# Patient Record
Sex: Male | Born: 1945 | Race: White | Hispanic: No | Smoking: Former smoker
Health system: Southern US, Community
[De-identification: ages and names within clinical notes are randomized; demographics above are authoritative.]

## PROBLEM LIST (undated history)

## (undated) DIAGNOSIS — I10 Essential (primary) hypertension: Secondary | ICD-10-CM

## (undated) DIAGNOSIS — D510 Vitamin B12 deficiency anemia due to intrinsic factor deficiency: Secondary | ICD-10-CM

## (undated) DIAGNOSIS — D591 Other autoimmune hemolytic anemias: Secondary | ICD-10-CM

## (undated) DIAGNOSIS — D589 Hereditary hemolytic anemia, unspecified: Secondary | ICD-10-CM

## (undated) DIAGNOSIS — I2510 Atherosclerotic heart disease of native coronary artery without angina pectoris: Secondary | ICD-10-CM

## (undated) DIAGNOSIS — E785 Hyperlipidemia, unspecified: Secondary | ICD-10-CM

## (undated) HISTORY — DX: Vitamin B12 deficiency anemia due to intrinsic factor deficiency: D51.0

## (undated) HISTORY — DX: Atherosclerotic heart disease of native coronary artery without angina pectoris: I25.10

## (undated) HISTORY — DX: Hyperlipidemia, unspecified: E78.5

## (undated) HISTORY — DX: Hereditary hemolytic anemia, unspecified: D58.9

## (undated) HISTORY — DX: Essential (primary) hypertension: I10

## (undated) HISTORY — DX: Other autoimmune hemolytic anemias: D59.1

---

## 2002-02-11 HISTORY — PX: CORONARY ARTERY BYPASS GRAFT: SHX141

## 2002-12-14 ENCOUNTER — Inpatient Hospital Stay (HOSPITAL_COMMUNITY): Admission: AD | Admit: 2002-12-14 | Discharge: 2002-12-20 | Payer: Self-pay | Admitting: Cardiology

## 2004-11-30 ENCOUNTER — Ambulatory Visit: Payer: Self-pay | Admitting: Cardiology

## 2006-02-07 ENCOUNTER — Ambulatory Visit: Payer: Self-pay | Admitting: Cardiology

## 2007-02-27 ENCOUNTER — Ambulatory Visit: Payer: Self-pay | Admitting: Cardiology

## 2007-03-03 ENCOUNTER — Encounter: Payer: Self-pay | Admitting: Cardiology

## 2007-06-19 ENCOUNTER — Ambulatory Visit: Payer: Self-pay | Admitting: Cardiology

## 2007-06-19 ENCOUNTER — Encounter: Payer: Self-pay | Admitting: Cardiology

## 2008-04-11 ENCOUNTER — Ambulatory Visit: Payer: Self-pay | Admitting: Cardiology

## 2008-11-28 DIAGNOSIS — E785 Hyperlipidemia, unspecified: Secondary | ICD-10-CM | POA: Insufficient documentation

## 2008-11-28 DIAGNOSIS — I10 Essential (primary) hypertension: Secondary | ICD-10-CM | POA: Insufficient documentation

## 2009-07-07 ENCOUNTER — Ambulatory Visit: Payer: Self-pay | Admitting: Cardiology

## 2009-07-07 DIAGNOSIS — I2510 Atherosclerotic heart disease of native coronary artery without angina pectoris: Secondary | ICD-10-CM | POA: Insufficient documentation

## 2010-03-13 NOTE — Assessment & Plan Note (Signed)
Summary: 1 YR FU PER MAR REMINDER-SRS   Visit Type:  Follow-up Primary Provider:  Dr. Doreen Beam   History of Present Illness: 65 year old male presents for follow-up. He reports no new problems with angina or unusual breathlessness. 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 He has not been exercising as regularly, typically no more than once a week. We did discuss this some today. He reports compliance with his medications, and is due later this year for a routine physical with Dr. Sherril Croon, at which time he typically has followup labs.  I reviewed his last ischemic evaluation via Cardiolite from May 2009.  Preventive Screening-Counseling & Management  Alcohol-Tobacco     Smoking Status: quit     Year Started: 33 yrs     Year Quit: 7 yrs ago  Current Medications (verified): 1)  Aspir-Low 81 Mg Tbec (Aspirin) .... Take 1 Tablet By Mouth Once A Day 2)  Red Yeast Rice Extract 600 Mg Caps (Red Yeast Rice Extract) .... Take 1 Tablet By Mouth Once A Day 3)  Garlic Oil 1000 Mg Caps (Garlic) .... Take 1 Tablet By Mouth Once A Day 4)  Fish Oil 1000 Mg Caps (Omega-3 Fatty Acids) .... Take 1 Tablet By Mouth Once A Day 5)  Multivitamins  Tabs (Multiple Vitamin) .... Take 1 Tablet By Mouth Once A Day 6)  Flax Seed Oil 1000 Mg Caps (Flaxseed (Linseed)) .... Take 1 Tablet By Mouth Once A Day 7)  Metoprolol Succinate 25 Mg Xr24h-Tab (Metoprolol Succinate) .... Take 1 Tablet By Mouth Once A Day 8)  Saw Palmetto 500 Mg Caps (Saw Palmetto (Serenoa Repens)) .... Take 1 Tablet By Mouth Once A Day 9)  Magnesium Gluconate 500 Mg Tabs (Magnesium Gluconate) .... Take 1 Tablet By Mouth Once A Day  Allergies (verified): No Known Drug Allergies  Comments:  Nurse/Medical Assistant: Did not bring list or meds.  Went from [employer].   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         Chronic intermittent edema of the right lower leg following vein harvesting. Appetite stable. No melena or hematochezia. Occasional nosebleed. Otherwise reviewed and negative.  Vital Signs:  Patient profile:   65 year old male Height:      73 inches Weight:      224.75 pounds BMI:     29.76 Pulse rate:   76 / minute BP sitting:   152 / 84  (right arm) Cuff size:   regular  Vitals Entered By: Hoover Brunette, LPN (Jul 07, 2009 2:57 PM)  Nutrition Counseling: Patient's BMI is greater than 25 and therefore counseled on weight management options. Is Patient Diabetic? No   Physical Exam  Additional Exam:  Overweight male in no acute distress. HEENT: Conjunctiva and lids normal, oropharynx clear. Neck: Supple, no elevated JVP or bruits. No thyromegaly. Lungs: Clear to auscultation, nonlabored. Cardiac: Regular rate and rhythm, no S3. Abdomen: Soft, nontender, bowel sounds present there Extremities: 1+ pitting edema right worse than left, distal pulses  one plus. Skin: Warm and dry.    Nuclear Study  Procedure date:  06/19/2007  Findings:      No diagnostic ST changes.  Large, fixed inferior defect consistent with scar, no frank ischemia.  LVEF 59%.  EKG  Procedure date:  07/07/2009  Findings:      Normal sinus rhythm at 71 beats per minute. Inferior Q waves noted.  Impression & Recommendations:  Problem # 1:  CORONARY ATHEROSCLEROSIS NATIVE CORONARY ARTERY (ICD-414.01)  Symptomatically stable on medical therapy. Last ischemic evaluation was in May 2009. His electrocardiogram  is also stable. We discussed a more consistent exercise regimen, and weight control. He plans to followup with Dr. Sherril Croon for a routine physical later this year. We plan to continue annual visits for now.  His updated medication list for this problem includes:    Aspir-low 81 Mg Tbec (Aspirin) .Marland Kitchen... Take 1 tablet by mouth once a day    Metoprolol Succinate 25 Mg Xr24h-tab (Metoprolol succinate) .Marland Kitchen... Take 1 tablet by mouth once a day  Problem # 2:  HYPERLIPIDEMIA-MIXED (ICD-272.4)  Followed by Dr. Sherril Croon. Patient continues on garlic supplements, red yeast rice extract, as well as omega-3 supplements.  Problem # 3:  HYPERTENSION, UNSPECIFIED (ICD-401.9)  Blood pressure elevated today. [name] tells me that this is not a typical measurement. I asked him to keep an eye on this. Weight loss and exercise would be beneficial. An ACE inhibitor would be a consideration if his pressure remains in this area.  His updated medication list for this problem includes:    Aspir-low 81 Mg Tbec (Aspirin) .Marland Kitchen... Take 1 tablet by mouth once a day    Metoprolol Succinate 25 Mg Xr24h-tab (Metoprolol succinate) .Marland Kitchen... Take 1 tablet by mouth once a day  Other Orders: EKG w/ Interpretation (93000)  Patient Instructions: 1)  Your physician wants you to follow-up in: 1 year. You will receive a reminder letter in the mail one-two months in advance. If you don't receive a letter, please call our office to schedule the follow-up appointment. 2)  Your physician recommends that you continue on your current medications as directed. Please refer to the Current Medication list given to you today.

## 2010-06-26 NOTE — Assessment & Plan Note (Signed)
[facility]                          [facility] NOTE   NAME:Wojtaszek, KEYONTAE HUCKEBY                    MRN:          [id number]  DATE:04/11/2008                            DOB:          11-19-1945    PRIMARY CARE PHYSICIAN:  Doreen Beam, MD   REASON FOR VISIT:  Annual followup.   HISTORY OF PRESENT ILLNESS:  Mr. Sparks was seen back in January 2009.  He has a history of cardiovascular disease, status post coronary artery  bypass grafting in November 2004.  He was referred for a followup  exercise Cardiolite back in May of last year for ischemic surveillance.  This study was reassuring showing an ejection fraction of 59% with mild  hypokinesis in the inferior wall and corresponding scar based on  perfusion imaging without any large areas of ischemia.  Symptomatically,  he has been stable without any significant angina.  He is exercising  perhaps 2 or 3 days a week and has cut back some with the cold weather  recently.  He is due for lipid followup with Dr. Sherril Croon.  His medicines,  otherwise, remain stable.  He is not having any claudication symptoms,  palpitations, or syncope.  His electrocardiogram shows sinus rhythm with  a single premature atrial complex and heart rate is 74 beats per minute.  No substantial changes are noted in comparison of previous tracing.   ALLERGIES:  No known drug allergies.   PRESENT MEDICATIONS:  1. Aspirin 81 mg p.o. daily.  2. Red yeast rice extract.  3. Garlic supplements.  4. Omega-3 fish oil supplements.  5. Multivitamin daily.  6. Flaxseed oil supplements.  7. Toprol-XL 12.5 mg daily.  8. Saw palmetto daily.   REVIEW OF SYMPTOMS:  As outlined above.  Otherwise, negative.   PHYSICAL EXAMINATION:  VITAL SIGNS:  Blood pressure is 142/91, heart  rate is 78, weight is 226 pounds which is stable.  GENERAL:  He is an overweight male in no acute distress.  HEENT:  Conjunctivae are normal. Oropharynx is  clear.  NECK:  Supple.  No elevated jugular venous pressure.  No loud bruits.  No thyromegaly is noted.  LUNGS:  Clear without labored breathing at rest.  CARDIAC:  Regular rate and rhythm.  No S3, gallop, or pericardial rub.  ABDOMEN:  Soft and nontender.  No bruits.  No obvious hepatomegaly.  EXTREMITIES:  No significant pitting edema.  There is trace edema on the  right.  SKIN:  Warm and dry.  MUSCULOSKELETAL:  No kyphosis noted.  NEUROPSYCHIATRIC:  The patient is alert and oriented x3.  Affect is  appropriate.   IMPRESSION AND RECOMMENDATIONS:  1. Cardiovascular disease, status post coronary artery bypass grafting      in November 2004 with overall normal ejection fraction.  Recent      Cardiolite within the last year shows no evidence of ischemia with      some scar in the inferior wall and a stable ejection fraction of      59%.  Mr. Dunnigan is not describing any problems with angina or  significant nitroglycerin use.  I did recommend that he increase      his exercise sessions during the week.  We will plan to continue      with an annual visit.  2. Hypertension, not as well controlled today.  Typically, this has      been in a much better range.  We talked about this some and he will      make more efforts at exercise and diet before considering any      medication adjustments.  I asked him to follow up with Dr. Sherril Croon in      the interim.     Jonelle Sidle, MD  Electronically Signed    SGM/MedQ  DD: 04/11/2008  DT: 04/12/2008  Job #: [id number]   cc:   Doreen Beam, MD

## 2010-06-26 NOTE — Assessment & Plan Note (Signed)
[facility]                          [facility] NOTE   NAME:[name] Page, [name] Page                    MRN:          [id number]  DATE:02/27/2007                            DOB:          1945-05-14    CARDIOLOGIST:  Jonelle Sidle, M.D.   PRIMARY CARE PHYSICIAN:  Doreen Beam, M.D.   REASON FOR VISIT:  Twelve-month follow up.   HISTORY OF THE PRESENT ILLNESS:  This is a 65 year old male patient with  a history of coronary disease status post CABG in November 2004 who  presents for annual follow up.  Since last being seen he has done well.  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  He exercises about three times a week.  He  denies any chest pain or shortness of breath.  Denies any exertional  chest heaviness or tightness.  Denies any orthopnea, PND or pedal edema.  Denies any syncope or near syncope.  Denies any palpitations.   MEDICATIONS:  Current medications include:  1. Toprol XL 25 mg a half tablet daily.  2. Fish oil daily.  3. Flaxseed daily.  4. Aspirin 81 mg daily.  5. Red yeast rice.  6. Magnesium.   ALLERGIES:  No known drug allergies.   PHYSICAL EXAMINATION:  GENERAL APPEARANCE:  On physical exam he is a  well-nourished, well-developed male.  VITAL SIGNS:  Blood pressure 120/79, pulse 88 and weight is 225 pounds.  HEENT:  The head, eyes, ears, nose and throat are normal.  NECK:  The neck is without JVD.  HEART:  Cardiac exam showed normal S1 and S2.  regular Rate and rhythm  without murmurs.  LUNGS:  The lungs are clear to auscultation bilaterally without wheezes,  rhonchi or rales.  ABDOMEN:  The abdomen is soft and nontender with normal bowel sounds.  No organomegaly.  No bruits.  EXTREMITIES:  The extremities are without edema.  Calves are soft and  nontender.  SKIN:  The skin is warm and dry.  NEUROLOGIC EXAMINATION:  Neurologically he is alert and oriented  times  three.  Cranial nerves II-XII are grossly intact.  VASCULAR:  The vascular exam is without carotid bruits bilaterally.   LABORATORY DATA:  The electrocardiogram revealed sinus rhythm with a  heart rate of 87, normal axis, frequent PVCs and no ischemic changes.   IMPRESSION:  1. Coronary disease status post coronary artery bypass graft in      November 2004.  2. Good left ventricular function.  3. Dyslipidemia.  4. Hypertension.  5. Ventricular ectopy   PLAN:  The patient presents today for follow up.  Overall he is doing  well without any recurrent episodes of chest pain or shortness of breath  to suggest ischemia.  He does have some ventricular ectopy noted on his  electrocardiogram.  He tells me that apparently he had a recent  echocardiogram done by Dr. Sherril Croon.  It sounds as though this was done  routinely.  It is greater than four years since his bypass surgery and  he is in need  of stress Cardiolite testing to assess graft patency.  He  would like to wait until after tax season to have this done.  This  should be acceptable.  We will go ahead and get a basic metabolic panel  and a magnesium level today to ensure that his renal function, potassium  and magnesium levels are stable given his increased ventricular ectopy.  He is asymptomatic with this.   I will bring him back in follow up in the next 12 months for routine  follow up.  He is to return sooner if needed.      Tereso Newcomer, PA-C  Electronically Signed      Jonelle Sidle, MD  Electronically Signed   SW/MedQ  DD: 02/27/2007  DT: 02/28/2007  Job #: [id number]   cc:   Doreen Beam, MD

## 2010-06-29 NOTE — Assessment & Plan Note (Signed)
[facility]                          [facility]ICE NOTE   NAME:[name], [name] VERON                    MRN:          [id number]  DATE:02/07/2006                            DOB:          11/18/1945    PRIMARY CARDIOLOGIST:  Jonelle Sidle, M.D.   REASON FOR OFFICE VISIT:  Scheduled annual followup.   [name] [name] is a very pleasant 65 year old male with history of  multivessel coronary artery disease, status post three-vessel CABG in  November 2004, with normal left ventricular function, who now presents  in followup.   Since last seen here in the clinic in October of last year by Dr.  Diona Browner, the patient continues to do extremely well from a clinical  standpoint, with no interim development of any signs/symptoms suggestive  of unstable angina pectoris.  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   Electrocardiogram today reveals NSR at 78 bpm with normal axis and  nonspecific ST abnormalities; persistent small Q-waves in the inferior  leads.   CURRENT MEDICATIONS:  1. Toprol XL 12.5 daily.  2. Fish oil.  3. Niaspan 250 daily.  4. Red yeast rice.  5. Magnesium.  6. Aspirin 81 daily.  7. Flax seed oil.   PHYSICAL EXAMINATION:  Blood pressure 120/80, pulse 78, regular, weight  224.  GENERAL:  A 65 year old male, sitting upright, in no distress.  NECK:  Palpable bilateral carotid pulses without bruits.  LUNGS:  Clear to auscultation in all fields.  HEART:  Regular rate and rhythm (S1 and S2), soft S4.  No significant  murmurs.  EXTREMITIES:  Palpable peripheral pulses with trace right pedal edema.  NEUROLOGIC:  No focal deficits.   IMPRESSION:  1. Coronary artery disease.      a.     Three-vessel CABG, November 2004.      b.     Preserved left ventricular  function.  2. Mixed dyslipidemia/low HDL.  3. Hypertension.  4. Remote tobacco.   PLAN:  Continue current medication regimen.  Continued close monitoring  of lipid status, per Dr. Sherril Croon, with recommended LDL goal of 70 or less.  Of note, the patient does have documented history of low HDL, but is on  both Niaspan and fish oil.  We will plan on having him return to the  clinic in one year for continued followup with Dr. Simona Huh.      Rozell Searing, PA-C  Electronically Signed      Jonelle Sidle, MD  Electronically Signed   GS/MedQ  DD: 02/07/2006  DT: 02/07/2006  Job #: [id number]   cc:   [name] [name]
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
## 2010-06-29 NOTE — Cardiovascular Report (Signed)
NAMEKAINEN, STRUCKMAN                         ACCOUNT NO.:  [id number]   MEDICAL RECORD NO.:  [id number]                   PATIENT TYPE:  OIB   LOCATION:  2023                                 FACILITY:  [facility]   PHYSICIAN:  Arturo Morton. Riley Kill, M.D.             DATE OF BIRTH:  Feb 08, 1946   DATE OF PROCEDURE:  12/14/2002  DATE OF DISCHARGE:                              CARDIAC CATHETERIZATION   INDICATIONS:  The patient is a pleasant 65 year old gentleman who had a  spell of near blindness on his way home from the football game at [place].  This ultimately led to a work-up which included a Cardiolite  which demonstrated redistribution in the inferior wall with a clear cut  inferior defect.  Based upon this he was referred for further evaluation  including cardiac catheterization.  Risks, benefits, and alternatives were  discussed with the patient in detail.   PROCEDURE:  1. Left heart catheterization.  2. Selective coronary arteriography.  3. Selective left ventriculography.  4. Internal mammary angiography.   DESCRIPTION OF PROCEDURE:  The patient was brought to the catheterization  laboratory and prepped and draped in the usual fashion.  Through an anterior  puncture the right femoral artery was easily entered.  6-French sheath was  placed.  Views of the left and right coronary arteries were obtained in  multiple angiographic projections.  To better identify the left system we  used a JL5 guiding catheter.  Ventriculography was performed in the RAO  position.  Subclavian angiography was also performed.  All this was done  without complication.  The patient was taken to the holding area in  satisfactory clinical condition.  The reason for the subclavian angiogram or  internal mammary angiogram included the need to identify the IMA for  arterial conduit for surgery.   HEMODYNAMIC DATA:  1. Central aortic pressure 149/91, mean 116.  2. Left ventricle 127/10.  3. No  gradient on pullback across the aortic valve.   ANGIOGRAPHIC DATA:  1. Ventriculography was performed in the RAO projection.  Overall systolic     function appeared to be reasonably preserved.  There was inferior     hypokinesis.  Ejection fraction would be estimated greater than 55%.     There did not appear to be significant mitral regurgitation.  2. The subclavian with internal mammary appeared to be widely patent.  3. The left main coronary artery is free of critical disease.  4  The left anterior descending proximally is fairly heavily calcified.  Just beyond a fairly normal segment at the ostium the vessel narrows down  and is diffusely diseased with about 70% segmental narrowing which includes  heavy calcification.  Moderate disease extends beyond the origin of the  major diagonal branch with about 50% narrowing just distal to this.  There  is about 30% narrowing more distally.  The distal LAD wraps the apex and is  a significant vessel.  1. The circumflex provides a tiny first marginal or intermediate vessel     which has about 40% proximal narrowing.  Following this there is about     50% narrowing in a tiny marginal branch followed by a 50% eccentric     plaque.  Beyond the takeoff of a second marginal branch is about 60%     narrowing which supplies a very large marginal or posterolateral vessel.     This vessel appears to be suitable for grafting.  2. The right coronary artery demonstrates segmental plaquing of about 40%     near the junction of the proximal mid vessel extending into the mid     vessel where there is a 90% focal eccentric irregular stenosis.  There is     40 and 60% lesions slightly more distally to this lesion encompassing     approximately a 40 mm length area in the mid right coronary.  There is     about 30-40% proximal irregularity in the posterior descending branch as     well.   CONCLUSION:  1. Well preserved left ventricular function.  2. Critical  disease of the right coronary artery with corresponding wall     motion abnormality on ventriculography with concomitant three vessel     coronary artery disease.   RECOMMENDATIONS:  The right coronary artery alone would be treatable, but  would require long stents.  The LAD is not as favorable for percutaneous  intervention and is relatively heavily calcified.  Given the length of the  LAD lesion my leaning is in the direction of revascularization surgery.  I  will ponder these options with the patient and I will have the CVTS surgery  service see him in consultation.                                               Arturo Morton. Riley Kill, M.D.    TDS/MEDQ  D:  12/14/2002  T:  12/15/2002  Job:  [id number]   cc:   Doreen Beam  [address]  [place]  [place] [address]  Fax: [phone]   Jonelle Sidle, M.D. [facility]   CV Lab

## 2010-06-29 NOTE — Op Note (Signed)
[name], [name]                         ACCOUNT NO.:  [id number]   MEDICAL RECORD NO.:  [id number]                   PATIENT TYPE:  INP   LOCATION:  2306                                 FACILITY:  [facility]   PHYSICIAN:  Evelene Croon, M.D.                  DATE OF BIRTH:  [birth year]   DATE OF PROCEDURE:  12/16/2002  DATE OF DISCHARGE:                                 OPERATIVE REPORT   PREOPERATIVE DIAGNOSIS:  Severe three-vessel coronary artery disease.   POSTOPERATIVE DIAGNOSIS:  Severe three-vessel coronary artery disease.   PROCEDURES:  1. Median sternotomy.  2. Extracorporeal circulation.  3. Coronary artery bypass graft surgery x3 using a left internal mammary     artery graft to the left anterior descending coronary artery, with a     saphenous vein graft to the obtuse marginal branch of the left circumflex     coronary artery, and a saphenous vein graft to the posterior descending     branch of the right coronary artery.  4. Endoscopic vein harvesting from the right leg.   SURGEON:  Alleen Borne, M.D.   ASSISTANT:  Salvatore Decent. Cornelius Moras, M.D.   SECOND ASSISTANT:  Darol Destine. Myer Haff, P.A.-C   ANESTHESIA:  General endotracheal.   INDICATIONS FOR PROCEDURE:  This patient is a 65 year old gentleman with a  history of smoking, who developed a spell of near blindness while driving  his car, that ultimately lead to a workup including a Cardiolite scan which  showed evidence of previous inferior infarct with some ischemia in this  area.  He underwent cardiac catheterization by Dr. Shawnie Pons which  showed three vessel coronary artery disease.  The LAD had a long, calcified  70% proximal LAD stenosis before the takeoff of a diagonal branch.  There is  about a 40 to 50% stenosis in the LAD beyond the diagonal branch. The left  circumflex had a small first marginal that had about 40% proximal stenosis.  There was a smaller second marginal that had 50% proximal stenosis.   The  left circumflex had about 50% stenosis after the second marginal branch.  There was a third marginal which was a medium size vessel and had no disease  in it, and then a fourth marginal which had a 60% ostial stenosis and was a  large vessel.  The right coronary artery had 90% proximal stenosis and about  60% mid vessel stenosis.  There was also about 40% stenosis at the takeoff  of the posterior descending artery.  There were several small posterolateral  branches.  Left ventricular ejection fraction was greater than 55% with  inferior hypokinesis. There was no mitral regurgitation and no gradient  across the aortic valve.   After review of the angiogram and examination of the patient, it was felt  that coronary artery bypass graft surgery was the best treatment to prevent  further ischemia and  infarction.  I considered using both internal mammary  grafts, but I did not think that the right internal mammary artery graft  would be long enough to reach down to the posterior descending coronary  artery.  I also considered using a radial artery graft, but Doppler  examination of both upper extremities showed that the palmar arch signal  decreased at least 50% with radial and ulnar compression.  Therefore, the  radial artery could not be used.   I discussed the operative procedure of coronary artery bypass graft surgery  with the patient and his wife including alternatives, benefits, and risks  including bleeding, blood transfusion, infection, stroke, graft failure,  myocardial infarction, and death.  Also discussed the importance of maximum  cardiac risk factor reduction including complete smoking cessation and  optimal control of his cholesterol profile.  He understood and agreed to  proceed.   DESCRIPTION OF PROCEDURE:  The patient was taken to the operating room and  placed on the table in the supine position.  After induction of general  endotracheal anesthesia, the Foley  catheter was placed in the bladder using  sterile technique.  Then the chest, abdomen, and both lower extremities were  prepped and draped in the usual sterile fashion.  The chest was entered  through a median sternotomy incision and the pericardium opened in the  midline.  Examination of the heart showed good ventricular contractility.  The ascending aorta had no palpable plaques in it.   Then the left internal mammary artery was harvested from the chest wall as a  pedicle graft.  This was a medium caliber vessel with excellent blood flow  through it.  At the same time, a segment of greater saphenous vein was  harvested from the right thigh using endoscopic vein harvest technique.  This vein was of medium caliber and good quality.  Then the patient was  heparinized, and when an adequate activated clotting time was achieved, the  distal ascending aorta was cannulated using a 23-French aortic cannula for  arterial inflow.  Venous outflow was achieved using a two-stage venous  cannula through the right atrial appendage.  An antegrade cardioplegia and  vent cannula was inserted in the aortic root.   The patient was placed on cardiopulmonary bypass and the distal coronary  arteries identified.  The LAD was a large graftable vessel that had no  distal disease in it.  The obtuse marginal branch was a large vessel that  had no distal disease in it.  The right coronary artery was diffusely  diseased and this extended out into the proximal portion of the posterior  descending coronary artery.  There was evidence of previous inferior  myocardial infarction with scar present.   This patient was found to have cold agglutinins by the blood bank in his  preoperative blood screening; therefore, I decided to use only crystalloid  cardioplegia and to maintain body temperature at 32 degrees C.  His agglutination occurred at 22 degrees C.  Then the aorta was crossclamped and  200 mL of warm crystalloid  cardioplegia was given to flush out the coronary  arteries.  This was followed by 500 mL of cold blood crystalloid  cardioplegia with quick arrest of the heart and good myocardial cooling.  We  obtained systemic hypothermia to 30 degrees C and topical hypothermia with  iced saline was used.  A temperature probe was placed in the septum and  insulating pad in the pericardium.   The first distal  anastomosis was performed to the posterior descending  coronary artery. The internal diameter of this vessel was about 1.75 mm.  The conduit used was a segment of greater saphenous vein. The anastomosis  performed in an end-to-side manner using continuous 7-0 Prolene suture.  Flow was measured through the graft and was excellent.   A second distal anastomosis was performed to the obtuse marginal branch. The  internal diameter was about 2 mm.  The conduit used was a second segment of  greater saphenous vein and the anastomosis was performed in an end-to-side  manner using continuous 7-0 Prolene suture.  Flow was measured through the  graft and was excellent.   Then the third distal anastomosis was performed to the midportion of the  left anterior descending coronary artery.  The internal diameter was about  2.5 mm. The conduit used was a left internal mammary artery graft and this  was brought through an opening in the left pericardium anterior to the  phrenic nerve.  This was anastomosed to the LAD in an end-to-side manner  using continuous 8-0 Prolene suture.  The pedicle was tacked to the  epicardium with 6-0 Prolene sutures.  The patient rewarmed to 37 degrees C  and the clamp removed from the mammary pedicle.  There was rapid warming of  the ventricular septum and return of spontaneous ventricular fibrillation.  The crossclamp was removed with a time of 39 minutes and the patient  spontaneously converted to sinus rhythm.   A partial occlusion clamp was placed in the aortic root and the two  proximal  vein graft anastomoses were performed in an end-to-side manner using  continuous 6-0 Prolene suture.  The clamp was removed, the vein graft  deaired, and the clamps removed from them.  The proximal and distal  anastomoses appeared hemostatic and the line of the grafts satisfactory.  Graft markers were placed around the proximal anastomoses.  Two temporary  right ventricular and right atrial pacing wires were placed and brought out  through the skin.   When the patient had rewarmed to 37 degrees C, he was weaned from  cardiopulmonary bypass on no inotropic agents.  Total bypass time was 77  minutes.  Cardiac function appeared excellent with cardiac output of 5  L/min.  Protamine was given and the venous and aortic cannulas were removed  without difficulty.  Hemostasis was achieved.  Three chest tubes were  placed; two in the posterior pericardium, one in the left pleural space and one in the anterior mediastinum.   The pericardium was reapproximated over the heart.  The sternum was closed  with #6 stainless steel wire.  The fascia was closed with continuous #1  Vicryl suture.  Subcutaneous tissue was closed with continuous 2-0 Vicryl  and the skin with 3-0 Vicryl  subcuticular closure.  Lower extremity vein  harvest site was closed in layers in a similar manner.  The needle, sponge,  and instrument counts correct according to scrub nurse.  Dry sterile  dressings were applied over the incisions and around the chest tubes which  were hooked to Pleurovac suction.   The patient remained hemodynamically stable and was transported to the SICU  in guarded, but stable condition.                                               Evelene Croon,  M.D.    BB/MEDQ  D:  12/16/2002  T:  12/16/2002  Job:  [id number]   cc:   Arturo Morton. Riley Kill, M.D.   [facility] Office

## 2010-11-01 ENCOUNTER — Encounter: Payer: Self-pay | Admitting: Cardiology

## 2010-11-02 ENCOUNTER — Encounter: Payer: Self-pay | Admitting: Cardiology

## 2010-11-02 ENCOUNTER — Encounter: Payer: Self-pay | Admitting: *Deleted

## 2010-11-02 ENCOUNTER — Ambulatory Visit (INDEPENDENT_AMBULATORY_CARE_PROVIDER_SITE_OTHER): Payer: BC Managed Care – PPO | Admitting: Cardiology

## 2010-11-02 ENCOUNTER — Telehealth: Payer: Self-pay | Admitting: *Deleted

## 2010-11-02 VITALS — BP 136/75 | HR 78 | Ht 73.0 in | Wt 221.0 lb

## 2010-11-02 DIAGNOSIS — R0602 Shortness of breath: Secondary | ICD-10-CM

## 2010-11-02 DIAGNOSIS — E785 Hyperlipidemia, unspecified: Secondary | ICD-10-CM

## 2010-11-02 DIAGNOSIS — I10 Essential (primary) hypertension: Secondary | ICD-10-CM

## 2010-11-02 DIAGNOSIS — I2510 Atherosclerotic heart disease of native coronary artery without angina pectoris: Secondary | ICD-10-CM

## 2010-11-02 NOTE — Assessment & Plan Note (Signed)
Blood pressure looks better. He continues to follow this at home.

## 2010-11-02 NOTE — Assessment & Plan Note (Signed)
Continue followup with Dr. Sherril Croon. We have discussed diet and exercise.

## 2010-11-02 NOTE — Assessment & Plan Note (Signed)
Reported today, although described as being fairly mild. He does mention that this is similar to symptoms that he had prior to his bypass. As noted, we plan followup ischemic testing.

## 2010-11-02 NOTE — Progress Notes (Signed)
Clinical Summary [name] is a 65 y.o.male presenting for followup. He was seen in May of last year.  He reports no angina, although does mention shortness of breath with activities such as going up steps carrying books or walking briskly. He states these symptoms remind him somewhat of his initial symptoms prior to surgery years ago. He does state that they are mild overall.  He has had no palpitations or syncope. Continues to work 2 jobs, has very limited time for exercise. He reports compliance with his medications.  No followup ischemic testing since 2009, reviewed below.  No Known Allergies  Medication list reviewed.  Past Medical History  Diagnosis Date  . Coronary atherosclerosis of native coronary artery     Multivessel, LVEF 59%  . Hyperlipidemia   . Essential hypertension, benign     Past Surgical History  Procedure Date  . Coronary artery bypass graft 2004    LIMA to LAD, SVG to OM, SVG to PDA    Family History  Problem Relation Age of Onset  . Coronary artery disease      Social History Mr. Kirk reports that he quit smoking about 8 years ago. His smoking use included Cigarettes. He has never used smokeless tobacco. Mr. Strout reports that he does not drink alcohol.  Review of Systems No claudication. No orthopnea or PND. Otherwise reviewed and negative except as outlined.  Physical Examination Filed Vitals:   11/02/10 1101  BP: 136/75  Pulse: 78   Overweight male in no acute distress.  HEENT: Conjunctiva and lids normal, oropharynx clear.  Neck: Supple, no elevated JVP or bruits. No thyromegaly.  Lungs: Clear to auscultation, nonlabored.  Cardiac: Regular rate and rhythm, no S3.  Abdomen: Soft, nontender, bowel sounds present there  Extremities: 1+ pitting edema right worse than left, distal pulses one plus.  Skin: Warm and dry.  Musculoskeletal: No kyphosis. Neuropsychiatric: Alert and oriented x3, affect appropriate.   ECG Sinus rhythm  at 74 with occasional PVCs, inferior Q waves consistent with old infarct.  Studies Cardiolite 06/19/2007: No diagnostic ST changes. Large, fixed inferior defect consistent with scar, no frank ischemia. LVEF 59%.   Problem List and Plan

## 2010-11-02 NOTE — Patient Instructions (Signed)
Your physician you to follow up in 1 year. You will receive a reminder letter in the mail one-two months in advance. If you don't receive a letter, please call our office to schedule the follow-up appointment. Your physician has requested that you have en exercise stress cardiolite. For further information please visit [email or web address] Please follow instruction sheet, as given.  Your physician recommends that you continue on your current medications as directed. Please refer to the Current Medication list given to you today. If the results of your test are normal or stable, you will receive a letter. If they are abnormal, the nurse will contact you by phone.

## 2010-11-02 NOTE — Telephone Encounter (Signed)
WT 221 STRESS CARDIOLITE AT [facility]   SCHEDULED FOR 11-09-2010

## 2010-11-02 NOTE — Assessment & Plan Note (Signed)
Plan to continue medical therapy and observation, with followup exercise Cardiolite in light of his reported dyspnea on exertion. Last ischemic testing was in 2009, outlined above. Depending on followup testing results and his symptoms over time, we can determine whether any additional evaluation is necessary. We have discussed warning signs and symptoms.

## 2010-11-02 NOTE — Telephone Encounter (Signed)
Pt has [employer].  [employer] [id number], exp 12-01-10

## 2010-11-08 ENCOUNTER — Encounter: Payer: Self-pay | Admitting: *Deleted

## 2010-11-09 DIAGNOSIS — I2510 Atherosclerotic heart disease of native coronary artery without angina pectoris: Secondary | ICD-10-CM

## 2010-11-13 ENCOUNTER — Telehealth: Payer: Self-pay | Admitting: Cardiology

## 2010-11-13 NOTE — Telephone Encounter (Signed)
Would like results of stress test done 11/09/10

## 2010-11-15 NOTE — Telephone Encounter (Signed)
Left message to call back on voice mail

## 2010-11-15 NOTE — Telephone Encounter (Signed)
Left message for pt at work number. Unable to leave message at home number.

## 2010-11-15 NOTE — Telephone Encounter (Signed)
Notes Recorded by Rozell Searing, PA on 11/13/2010 at 11:47 AM Evidence of prior inferior MI, with no significant ischemia: EF 53%. Continue current medication regimen, and f/u with SM as previously scheduled.

## 2010-11-19 NOTE — Telephone Encounter (Signed)
Patient came in wanting to know his test results. Really wanting to know his results Left the following contact numbers. work [phone] Cell [phone] Home [phone]

## 2010-11-20 NOTE — Telephone Encounter (Signed)
Pt notified of results and verbalized understanding  

## 2011-10-08 ENCOUNTER — Encounter: Payer: Self-pay | Admitting: Cardiology

## 2011-10-08 ENCOUNTER — Ambulatory Visit (INDEPENDENT_AMBULATORY_CARE_PROVIDER_SITE_OTHER): Payer: BC Managed Care – PPO | Admitting: Cardiology

## 2011-10-08 VITALS — BP 155/91 | HR 72 | Ht 73.0 in | Wt 232.0 lb

## 2011-10-08 DIAGNOSIS — E785 Hyperlipidemia, unspecified: Secondary | ICD-10-CM

## 2011-10-08 DIAGNOSIS — I10 Essential (primary) hypertension: Secondary | ICD-10-CM

## 2011-10-08 DIAGNOSIS — I2510 Atherosclerotic heart disease of native coronary artery without angina pectoris: Secondary | ICD-10-CM

## 2011-10-08 NOTE — Assessment & Plan Note (Signed)
Continue medical therapy and observation. Cardiolite from last year reviewed, overall low risk. Encouraged him to continue to maintain an exercise regimen, especially now that he is retiring. Will continue annual followup.

## 2011-10-08 NOTE — Patient Instructions (Addendum)

## 2011-10-08 NOTE — Assessment & Plan Note (Signed)
Followed by Dr. Sherril Croon. Goal LDL should be close to 70.

## 2011-10-08 NOTE — Assessment & Plan Note (Signed)
Blood pressure is elevated today. Discussed sodium restriction, exercise regimen and weight loss.

## 2011-10-08 NOTE — Progress Notes (Signed)
   Clinical Summary Mr. Haugan is a 66 y.o.male presenting for followup. He was seen in September of last year. 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  He reports no angina. He started exercising more regularly. States he just had a physical with Dr. Sherril Croon, lipids were obtained at that time.  He had a followup Cardiolite last year showed evidence of inferior scar with no active ischemia, LVEF 53%. Medical therapy was continued. ECG reviewed in EMR, consistent with previous inferior infarct, single PVC.  No Known Allergies  Current Outpatient Prescriptions  Medication Sig Dispense Refill  . aspirin 81 MG tablet Take 81 mg by mouth daily.        . fish oil-omega-3 fatty acids 1000 MG capsule Take 1 capsule by mouth daily.        . Flaxseed, Linseed, (FLAX SEED OIL) 1000 MG CAPS Take 1 capsule by mouth daily.        . Garlic Oil 1000 MG CAPS Take 1 capsule by mouth daily.        Marland Kitchen levothyroxine (SYNTHROID, LEVOTHROID) 50 MCG tablet Take 50 mcg by mouth daily.      . metoprolol succinate (TOPROL-XL) 25 MG 24 hr tablet Take 25 mg by mouth daily.        . Multiple Vitamin (MULTIVITAMIN) tablet Take 1 tablet by mouth daily.        . Red Yeast Rice Extract 600 MG CAPS Take 1 capsule by mouth daily.        . saw palmetto 500 MG capsule Take 500 mg by mouth daily.          Past Medical History  Diagnosis Date  . Coronary atherosclerosis of native coronary artery     Multivessel, LVEF 59%  . Hyperlipidemia   . Essential hypertension, benign     Past Surgical History  Procedure Date  . Coronary artery bypass graft 2004    LIMA to LAD, SVG to OM, SVG to PDA    Social History Mr. Engram reports that he quit smoking about 9 years ago. His smoking use included Cigarettes. He has never used smokeless tobacco. Mr. Dubois reports that he does not drink alcohol.  Review of Systems No palpitations, no bleeding problems. Stable appetite. No  syncope. Otherwise negative.  Physical Examination Filed Vitals:   10/08/11 1348  BP: 155/91  Pulse: 72    Overweight male in no acute distress.  HEENT: Conjunctiva and lids normal, oropharynx clear.  Neck: Supple, no elevated JVP or bruits. No thyromegaly.  Lungs: Clear to auscultation, nonlabored.  Cardiac: Regular rate and rhythm, no S3.  Abdomen: Soft, nontender, bowel sounds present there  Extremities: 1+ pitting edema right worse than left, distal pulses one plus.  Skin: Warm and dry.  Musculoskeletal: No kyphosis.  Neuropsychiatric: Alert and oriented x3, affect appropriate.   Problem List and Plan   CORONARY ATHEROSCLEROSIS NATIVE CORONARY ARTERY Continue medical therapy and observation. Cardiolite from last year reviewed, overall low risk. Encouraged him to continue to maintain an exercise regimen, especially now that he is retiring. Will continue annual followup.  HYPERLIPIDEMIA-MIXED Followed by Dr. Sherril Croon. Goal LDL should be close to 70.  HYPERTENSION, UNSPECIFIED Blood pressure is elevated today. Discussed sodium restriction, exercise regimen and weight loss.    Jonelle Sidle, M.D., F.A.C.C.

## 2012-09-28 ENCOUNTER — Encounter: Payer: Self-pay | Admitting: Cardiology

## 2012-09-28 ENCOUNTER — Ambulatory Visit (INDEPENDENT_AMBULATORY_CARE_PROVIDER_SITE_OTHER): Payer: Medicare Other | Admitting: Cardiology

## 2012-09-28 ENCOUNTER — Telehealth: Payer: Self-pay | Admitting: Cardiology

## 2012-09-28 ENCOUNTER — Other Ambulatory Visit: Payer: Self-pay | Admitting: Cardiology

## 2012-09-28 VITALS — BP 124/78 | HR 97 | Ht 73.0 in | Wt 216.0 lb

## 2012-09-28 DIAGNOSIS — I10 Essential (primary) hypertension: Secondary | ICD-10-CM

## 2012-09-28 DIAGNOSIS — I2510 Atherosclerotic heart disease of native coronary artery without angina pectoris: Secondary | ICD-10-CM

## 2012-09-28 DIAGNOSIS — Z01810 Encounter for preprocedural cardiovascular examination: Secondary | ICD-10-CM

## 2012-09-28 DIAGNOSIS — E785 Hyperlipidemia, unspecified: Secondary | ICD-10-CM

## 2012-09-28 DIAGNOSIS — I200 Unstable angina: Secondary | ICD-10-CM

## 2012-09-28 LAB — PROTIME-INR

## 2012-09-28 NOTE — Telephone Encounter (Signed)
No precert required 

## 2012-09-28 NOTE — Progress Notes (Signed)
 Clinical Summary [name] Page is a 67 y.o.male last seen in August 2013. He reports that over the last few months he has been experiencing progressive dyspnea on exertion and angina symptoms. Cites activities such as lifting his grandchildren, walking up steps or up inclines. He states that these are very similar to his initial angina symptoms prior to bypass surgery. Symptoms escalating within the last few weeks particularly. He reports compliance with his medical regimen.  He had a followup Cardiolite in 2012 that showed evidence of inferior scar with no active ischemia, LVEF 53%.  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   No Known Allergies  Current Outpatient Prescriptions  Medication Sig Dispense Refill  . aspirin 81 MG tablet Take 81 mg by mouth daily.        . fish oil-omega-3 fatty acids 1000 MG capsule Take 1 capsule by mouth daily.        . Flaxseed, Linseed, (FLAX SEED OIL) 1000 MG CAPS Take 1 capsule by mouth daily.        . Garlic Oil 1000 MG CAPS Take 1 capsule by mouth daily.        . levothyroxine (SYNTHROID, LEVOTHROID) 50 MCG tablet Take 50 mcg by mouth daily.      . metoprolol succinate (TOPROL-XL) 25 MG 24 hr tablet Take 25 mg by mouth daily.        . Multiple Vitamin (MULTIVITAMIN) tablet Take 1 tablet by mouth daily.        . Red Yeast Rice Extract 600 MG CAPS Take 1 capsule by mouth daily.        . saw palmetto 500 MG capsule Take 500 mg by mouth daily.         No current facility-administered medications for this visit.    Past Medical History  Diagnosis Date  . Coronary atherosclerosis of native coronary artery     Multivessel, LVEF 59%  . Hyperlipidemia   . Essential hypertension, benign     Past Surgical History  Procedure Laterality Date  . Coronary artery bypass graft  2004    LIMA to LAD, SVG to OM, SVG to PDA    Family History  Problem Relation Age of Onset  . Coronary  artery disease      Social History [name] Page reports that he quit smoking about 10 years ago. His smoking use included Cigarettes. He smoked 0.00 packs per day. He has never used smokeless tobacco. [name] Page reports that he does not drink alcohol.  Review of Systems No palpitations or syncope. Arthritic pains. 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 No claudication. Does feel generally weaker than usual. Otherwise negative.  Physical Examination Filed Vitals:   09/28/12 0902  BP: 124/78  Pulse: 97   Filed Weights   09/28/12 0902  Weight: 216 lb (97.977 kg)    Overweight male in no acute distress.  HEENT: Conjunctiva and lids normal, oropharynx clear.  Neck: Supple, no elevated JVP or bruits. No thyromegaly.  Lungs: Clear to auscultation, nonlabored.  Cardiac: Regular rate and rhythm with ectopy, no S3.  Abdomen: Soft, nontender, bowel sounds present there  Extremities: 1+ pitting edema right worse than left, distal pulses one plus.  Skin: Warm and dry.  Musculoskeletal: No kyphosis.  Neuropsychiatric: Alert and oriented x3, affect appropriate.   Problem List and Plan   Accelerating angina As described above, present despite good medical therapy. ECG shows no acute changes   although heart rate is up and PVCs are new. We discussed options for followup evaluation, and after reviewing the potential risks and benefits, will proceed with a diagnostic cardiac catheterization to most clearly assess for change in coronary or bypass graft anatomy that might require vascularization. He is in agreement to proceed, and this is being scheduled within the next few days. No change to current regimen. If symptoms suddenly escalate prior to procedure, should seek urgent medical attention.  CORONARY ATHEROSCLEROSIS NATIVE CORONARY ARTERY History of multivessel disease status post CABG in 2004 as outlined above.  HYPERLIPIDEMIA-MIXED He continues on omega-3 supplements flaxseed extract, and red  yeast rice, has had statin intolerance.  HYPERTENSION, UNSPECIFIED Blood pressure looks good today.    Samuel G. McDowell, M.D., F.A.C.C.   

## 2012-09-28 NOTE — Telephone Encounter (Signed)
:   Left Heart Cath - [facility], JV Cath, Wednesday 8-20 at 10:30 AM  DX: Progressive chest pain

## 2012-09-28 NOTE — Assessment & Plan Note (Signed)
Blood pressure looks good today. 

## 2012-09-28 NOTE — Patient Instructions (Addendum)
Your physician has requested that you have a cardiac catheterization. Cardiac catheterization is used to diagnose and/or treat various heart conditions. Doctors may recommend this procedure for a number of different reasons. The most common reason is to evaluate chest pain. Chest pain can be a symptom of coronary artery disease (CAD), and cardiac catheterization can show whether plaque is narrowing or blocking your heart's arteries. This procedure is also used to evaluate the valves, as well as measure the blood flow and oxygen levels in different parts of your heart. For further information please visit [email or web address] Please follow instruction sheet, as given.  Go today to have CBC, BMET, PT INR, PTT and chest xray done at [facility].

## 2012-09-28 NOTE — Assessment & Plan Note (Signed)
History of multivessel disease status post CABG in 2004 as outlined above.

## 2012-09-28 NOTE — Assessment & Plan Note (Signed)
As described above, present despite good medical therapy. ECG shows no acute changes although heart rate is up and PVCs are new. We discussed options for followup evaluation, and after reviewing the potential risks and benefits, will proceed with a diagnostic cardiac catheterization to most clearly assess for change in coronary or bypass graft anatomy that might require vascularization. He is in agreement to proceed, and this is being scheduled within the next few days. No change to current regimen. If symptoms suddenly escalate prior to procedure, should seek urgent medical attention.

## 2012-09-28 NOTE — Assessment & Plan Note (Signed)
He continues on omega-3 supplements flaxseed extract, and red yeast rice, has had statin intolerance.

## 2012-09-29 ENCOUNTER — Telehealth: Payer: Self-pay | Admitting: *Deleted

## 2012-09-29 DIAGNOSIS — D649 Anemia, unspecified: Secondary | ICD-10-CM

## 2012-09-29 NOTE — Telephone Encounter (Signed)
Message copied by Eustace Moore on Tue Sep 29, 2012  1:16 PM ------      Message from: Jonelle Sidle      Created: Tue Sep 29, 2012 11:58 AM       Reviewed. Probably okay to go ahead and proceed with diagnostic cardiac catheterization to at least understand his coronary and bypass graft anatomy. He is however significantly anemic with hemoglobin of 9, MCV is elevated rather than low.. Ask him if he has noted any bleeding in his stools. This will clearly need to be pursued further, referral to gastroenterology after we confirm his coronary anatomy. I am hopeful that he can be managed medically in terms of his CAD without necessarily pursuing further revascularization that might require intensification of antiplatelet therapy. ------

## 2012-09-29 NOTE — Telephone Encounter (Signed)
Patient informed and says he hasn't seen any blood in his stool.

## 2012-09-30 ENCOUNTER — Encounter (HOSPITAL_BASED_OUTPATIENT_CLINIC_OR_DEPARTMENT_OTHER): Payer: Self-pay

## 2012-09-30 ENCOUNTER — Encounter (HOSPITAL_BASED_OUTPATIENT_CLINIC_OR_DEPARTMENT_OTHER)
Admission: RE | Disposition: A | Discharge status: Home / Self Care | Payer: Self-pay | Source: Ambulatory Visit | Attending: Cardiovascular Disease

## 2012-09-30 ENCOUNTER — Inpatient Hospital Stay (HOSPITAL_BASED_OUTPATIENT_CLINIC_OR_DEPARTMENT_OTHER)
Admission: RE | Admit: 2012-09-30 | Discharge: 2012-09-30 | Disposition: A | Discharge status: Home / Self Care | Payer: Medicare Other | Source: Ambulatory Visit | Attending: Cardiovascular Disease | Admitting: Cardiovascular Disease

## 2012-09-30 DIAGNOSIS — Z951 Presence of aortocoronary bypass graft: Secondary | ICD-10-CM | POA: Insufficient documentation

## 2012-09-30 DIAGNOSIS — I10 Essential (primary) hypertension: Secondary | ICD-10-CM | POA: Insufficient documentation

## 2012-09-30 DIAGNOSIS — E785 Hyperlipidemia, unspecified: Secondary | ICD-10-CM | POA: Insufficient documentation

## 2012-09-30 DIAGNOSIS — I2510 Atherosclerotic heart disease of native coronary artery without angina pectoris: Secondary | ICD-10-CM | POA: Insufficient documentation

## 2012-09-30 DIAGNOSIS — I209 Angina pectoris, unspecified: Secondary | ICD-10-CM | POA: Insufficient documentation

## 2012-09-30 SURGERY — JV LEFT HEART CATHETERIZATION WITH CORONARY ANGIOGRAM

## 2012-09-30 MED ORDER — SODIUM CHLORIDE 0.9 % IV SOLN: INTRAVENOUS | Status: DC

## 2012-09-30 MED ORDER — SODIUM CHLORIDE 0.9 % IV SOLN: 250.0000 mL | INTRAVENOUS | Status: DC | PRN

## 2012-09-30 MED ORDER — SODIUM CHLORIDE 0.9 % IJ SOLN: 3.0000 mL | Freq: Two times a day (BID) | INTRAMUSCULAR | Status: DC

## 2012-09-30 MED ORDER — SODIUM CHLORIDE 0.9 % IV SOLN: 1.0000 mL/kg/h | INTRAVENOUS | Status: DC

## 2012-09-30 MED ORDER — ACETAMINOPHEN 325 MG PO TABS: 650.0000 mg | ORAL_TABLET | ORAL | Status: DC | PRN

## 2012-09-30 MED ORDER — ASPIRIN 81 MG PO CHEW: 324.0000 mg | CHEWABLE_TABLET | ORAL | Status: DC

## 2012-09-30 MED ORDER — SODIUM CHLORIDE 0.9 % IJ SOLN: 3.0000 mL | INTRAMUSCULAR | Status: DC | PRN

## 2012-09-30 MED ORDER — ONDANSETRON HCL 4 MG/2ML IJ SOLN: 4.0000 mg | Freq: Four times a day (QID) | INTRAMUSCULAR | Status: DC | PRN

## 2012-09-30 NOTE — OR Nursing (Signed)
Tegaderm dressing applied, site level 0, bedrest begins at 1205

## 2012-09-30 NOTE — OR Nursing (Signed)
Meal served 

## 2012-09-30 NOTE — OR Nursing (Signed)
Discharge instructions reviewed and signed, pt stated understanding, ambulated in hall without difficulty, site level 0, transported to wife's car via wheelchair 

## 2012-09-30 NOTE — CV Procedure (Signed)
   Cardiac Catheterization Procedure Note  Name: [name] Page MRN: [id number] DOB: 12-Aug-1945  Procedure: Left Heart Cath, Selective Coronary Angiography, LV angiography, LIMA angiography, SVG angiography  Indication: Exertional dyspnea and angina, accelerating pattern. Known CAD s/p CABG 2004  Procedural details: The right groin was prepped, draped, and anesthetized with 1% lidocaine. Using modified Seldinger technique, a 4 French sheath was introduced into the right femoral artery. Standard Judkins catheters were used for coronary angiography, SVG angiography, LIMA angiography, and left ventriculography. Catheter exchanges were performed over a guidewire. There were no immediate procedural complications. The patient was transferred to the post catheterization recovery area for further monitoring.  Procedural Findings: Hemodynamics:  AO 118/66 mean 90 LV 116/16   Coronary angiography: Coronary dominance: right  Left mainstem: Widely patent with mild irregularity but no significant stenosis. Divides into the LAD and left circumflex.  Left anterior descending (LAD): There is moderately tight 70% proximal LAD stenosis. The first diagonal branch is patent. The mid LAD fills competitively from the mammary artery.  Left circumflex (LCx): There is a small intermediate branch. The AV groove circumflex is patent. The mid AV circumflex has a 60% stenosis. After the second obtuse marginal, the distal AV circumflex is subtotally occluded with TIMI 1 flow beyond the stenosis.  Right coronary artery (RCA): Severely diseased throughout the proximal RCA. The mid vessel is totally occluded.  Saphenous vein graft to obtuse marginal is widely patent with no significant stenosis. The obtuse marginal branches have no significant disease beyond the graft insertion site.  Saphenous vein graft to PDA is widely patent with no significant stenosis. The PDA has no significant disease.  LIMA to LAD: Widely  patent throughout. The LAD beyond the LIMA insertion site is patent without significant disease it reaches the left ventricular apex.  Left ventriculography: There is hypokinesis of the inferior wall. The other LV segments contract normally. The estimated left ventricular ejection fraction is 55%.  Final Conclusions:   1. Severe three-vessel native coronary artery disease 2. Status post aortocoronary bypass surgery with continued patency of all bypass grafts 3. Mild segmental left ventricular systolic dysfunction.  Recommendations: Continued medical therapy for CAD. The patient has a good prognosis with excellent graft patency.   Tonny Bollman 09/30/2012, 12:20 PM

## 2012-09-30 NOTE — OR Nursing (Signed)
+  Allen's test right hand 

## 2012-09-30 NOTE — Interval H&P Note (Signed)
History and Physical Interval Note:  09/30/2012 11:21 AM  [name] Page  has presented today for surgery, with the diagnosis of cp  The various methods of treatment have been discussed with the patient and family. After consideration of risks, benefits and other options for treatment, the patient has consented to  Procedure(s): JV LEFT HEART CATHETERIZATION WITH CORONARY ANGIOGRAM (N/A) as a surgical intervention .  The patient's history has been reviewed, patient examined, no change in status, stable for surgery.  I have reviewed the patient's chart and labs.  Questions were answered to the patient's satisfaction.    Cath Lab Visit (complete for each Cath Lab visit)  Clinical Evaluation Leading to the Procedure:   ACS: no  Non-ACS:    Anginal Classification: CCS III  Anti-ischemic medical therapy: Minimal Therapy (1 class of medications)  Non-Invasive Test Results: No non-invasive testing performed  Prior CABG: Previous CABG         Tonny Bollman

## 2012-09-30 NOTE — H&P (View-Only) (Signed)
Clinical Summary [name] Page is a 67 y.o.male last seen in August 2013. He reports that over the last few months he has been experiencing progressive dyspnea on exertion and angina symptoms. Cites activities such as lifting his grandchildren, walking up steps or up inclines. He states that these are very similar to his initial angina symptoms prior to bypass surgery. Symptoms escalating within the last few weeks particularly. He reports compliance with his medical regimen.  He had a followup Cardiolite in 2012 that showed evidence of inferior scar with no active ischemia, LVEF 53%.  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   No Known Allergies  Current Outpatient Prescriptions  Medication Sig Dispense Refill  . aspirin 81 MG tablet Take 81 mg by mouth daily.        . fish oil-omega-3 fatty acids 1000 MG capsule Take 1 capsule by mouth daily.        . Flaxseed, Linseed, (FLAX SEED OIL) 1000 MG CAPS Take 1 capsule by mouth daily.        . Garlic Oil 1000 MG CAPS Take 1 capsule by mouth daily.        Marland Kitchen levothyroxine (SYNTHROID, LEVOTHROID) 50 MCG tablet Take 50 mcg by mouth daily.      . metoprolol succinate (TOPROL-XL) 25 MG 24 hr tablet Take 25 mg by mouth daily.        . Multiple Vitamin (MULTIVITAMIN) tablet Take 1 tablet by mouth daily.        . Red Yeast Rice Extract 600 MG CAPS Take 1 capsule by mouth daily.        . saw palmetto 500 MG capsule Take 500 mg by mouth daily.         No current facility-administered medications for this visit.    Past Medical History  Diagnosis Date  . Coronary atherosclerosis of native coronary artery     Multivessel, LVEF 59%  . Hyperlipidemia   . Essential hypertension, benign     Past Surgical History  Procedure Laterality Date  . Coronary artery bypass graft  2004    LIMA to LAD, SVG to OM, SVG to PDA    Family History  Problem Relation Age of Onset  . Coronary  artery disease      Social History [name] Page reports that he quit smoking about 10 years ago. His smoking use included Cigarettes. He smoked 0.00 packs per day. He has never used smokeless tobacco. [name] Page reports that he does not drink alcohol.  Review of Systems No palpitations or syncope. Arthritic pains. 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 No claudication. Does feel generally weaker than usual. Otherwise negative.  Physical Examination Filed Vitals:   09/28/12 0902  BP: 124/78  Pulse: 97   Filed Weights   09/28/12 0902  Weight: 216 lb (97.977 kg)    Overweight male in no acute distress.  HEENT: Conjunctiva and lids normal, oropharynx clear.  Neck: Supple, no elevated JVP or bruits. No thyromegaly.  Lungs: Clear to auscultation, nonlabored.  Cardiac: Regular rate and rhythm with ectopy, no S3.  Abdomen: Soft, nontender, bowel sounds present there  Extremities: 1+ pitting edema right worse than left, distal pulses one plus.  Skin: Warm and dry.  Musculoskeletal: No kyphosis.  Neuropsychiatric: Alert and oriented x3, affect appropriate.   Problem List and Plan   Accelerating angina As described above, present despite good medical therapy. ECG shows no acute changes  although heart rate is up and PVCs are new. We discussed options for followup evaluation, and after reviewing the potential risks and benefits, will proceed with a diagnostic cardiac catheterization to most clearly assess for change in coronary or bypass graft anatomy that might require vascularization. He is in agreement to proceed, and this is being scheduled within the next few days. No change to current regimen. If symptoms suddenly escalate prior to procedure, should seek urgent medical attention.  CORONARY ATHEROSCLEROSIS NATIVE CORONARY ARTERY History of multivessel disease status post CABG in 2004 as outlined above.  HYPERLIPIDEMIA-MIXED He continues on omega-3 supplements flaxseed extract, and red  yeast rice, has had statin intolerance.  HYPERTENSION, UNSPECIFIED Blood pressure looks good today.    Jonelle Sidle, M.D., F.A.C.C.

## 2012-10-09 ENCOUNTER — Encounter (INDEPENDENT_AMBULATORY_CARE_PROVIDER_SITE_OTHER): Payer: Medicare Other

## 2012-10-09 DIAGNOSIS — D599 Acquired hemolytic anemia, unspecified: Secondary | ICD-10-CM

## 2012-10-09 DIAGNOSIS — E538 Deficiency of other specified B group vitamins: Secondary | ICD-10-CM

## 2012-10-09 DIAGNOSIS — R05 Cough: Secondary | ICD-10-CM

## 2012-10-09 DIAGNOSIS — R7309 Other abnormal glucose: Secondary | ICD-10-CM

## 2012-10-13 ENCOUNTER — Encounter (INDEPENDENT_AMBULATORY_CARE_PROVIDER_SITE_OTHER): Payer: Medicare Other

## 2012-10-13 DIAGNOSIS — D599 Acquired hemolytic anemia, unspecified: Secondary | ICD-10-CM

## 2012-10-13 DIAGNOSIS — E119 Type 2 diabetes mellitus without complications: Secondary | ICD-10-CM

## 2012-10-14 ENCOUNTER — Ambulatory Visit: Payer: BC Managed Care – PPO | Admitting: Cardiology

## 2012-10-15 ENCOUNTER — Encounter: Payer: Medicare Other | Admitting: Cardiology

## 2012-10-15 ENCOUNTER — Ambulatory Visit (INDEPENDENT_AMBULATORY_CARE_PROVIDER_SITE_OTHER): Payer: Medicare Other | Admitting: Cardiology

## 2012-10-15 ENCOUNTER — Encounter: Payer: Self-pay | Admitting: Cardiology

## 2012-10-15 VITALS — BP 124/71 | HR 85 | Ht 73.0 in | Wt 219.0 lb

## 2012-10-15 DIAGNOSIS — I2510 Atherosclerotic heart disease of native coronary artery without angina pectoris: Secondary | ICD-10-CM

## 2012-10-15 DIAGNOSIS — I200 Unstable angina: Secondary | ICD-10-CM

## 2012-10-15 NOTE — Progress Notes (Signed)
   Clinical Summary [name] Page is a 67 y.o.male seen recently with accelerating angina symptoms. He was referred for a cardiac catheterization, which fortunately showed patent bypass grafts with severe native vessel CAD, LVEF 55% with inferior hypokinesis.  Baseline labwork during this evaluation also revealed progressive anemia with hemoglobin down to 9.0. MCV was elevated at 114. CBC and platelet count were normal. No obvious bleeding reported. He had followup with [name] Page. He has also been referred to see [name] Page in [place]. I. An requesting records for review. [name] Page tells me that there is some concern that he may have a hemolytic anemia, other causes being investigated.  He has been taken off aspirin temporarily. Also now on high-dose steroids. He reports that his hemoglobin got down to "7.9" but has come back up on the steroids. He still is short of breath with activity, but states it is somewhat better.   No Known Allergies  Current Outpatient Prescriptions  Medication Sig Dispense Refill  . levothyroxine (SYNTHROID, LEVOTHROID) 50 MCG tablet Take 50 mcg by mouth daily.      . metoprolol succinate (TOPROL-XL) 25 MG 24 hr tablet Take 25 mg by mouth daily.        Marland Kitchen omeprazole (PRILOSEC) 20 MG capsule Take 20 mg by mouth daily.      . predniSONE (DELTASONE) 20 MG tablet Take 20 mg by mouth QID.       No current facility-administered medications for this visit.    Past Medical History  Diagnosis Date  . Coronary atherosclerosis of native coronary artery     Multivessel, LVEF 59%  . Hyperlipidemia   . Essential hypertension, benign     Past Surgical History  Procedure Laterality Date  . Coronary artery bypass graft  2004    LIMA to LAD, SVG to OM, SVG to PDA    Social History [name] Page reports that he quit smoking about 10 years ago. His smoking use included Cigarettes. He smoked 0.00 packs per day. He has never used smokeless tobacco. [name] Page reports that he  does not drink alcohol.  Review of Systems As outlined above, was negative.  Physical Examination Filed Vitals:   10/15/12 1302  BP: 124/71  Pulse: 85   Filed Weights   10/15/12 1302  Weight: 219 lb (99.338 kg)    Overweight male in no acute distress.  HEENT: Conjunctiva and lids normal, oropharynx clear.  Neck: Supple, no elevated JVP or bruits. No thyromegaly.  Lungs: Clear to auscultation, nonlabored.  Cardiac: Regular rate and rhythm with ectopy, no S3.  Abdomen: Soft, nontender, bowel sounds present there  Extremities: 1+ pitting edema right worse than left, distal pulses one plus.  Skin: Warm and dry.  Musculoskeletal: No kyphosis.  Neuropsychiatric: Alert and oriented x3, affect appropriate.   Problem List and Plan   CORONARY ATHEROSCLEROSIS NATIVE CORONARY ARTERY Patent bypass grafts by recent cardiac catheterization. Continue medical therapy and observation.  Accelerating angina Symptoms most likely related to progressive anemia. He is off aspirin temporarily, further evaluation per hematology. He states that there is some concern that he may have hemolytic anemia. He is on high-dose steroids. I am requesting records for review.    Jonelle Sidle, M.D., F.A.C.C.

## 2012-10-15 NOTE — Assessment & Plan Note (Signed)
Patent bypass grafts by recent cardiac catheterization. Continue medical therapy and observation.

## 2012-10-15 NOTE — Patient Instructions (Addendum)
Your physician recommends that you schedule a follow-up appointment in: 1 year You will receive a reminder letter two months in advance reminding you to call and schedule your appointment. If you don't receive this letter, please contact our office.  Your physician recommends that you continue on your current medications as directed. Please refer to the Current Medication list given to you today.   

## 2012-10-15 NOTE — Assessment & Plan Note (Signed)
Symptoms most likely related to progressive anemia. He is off aspirin temporarily, further evaluation per hematology. He states that there is some concern that he may have hemolytic anemia. He is on high-dose steroids. I am requesting records for review.

## 2012-10-21 ENCOUNTER — Encounter (INDEPENDENT_AMBULATORY_CARE_PROVIDER_SITE_OTHER): Payer: Medicare Other

## 2012-10-21 DIAGNOSIS — R7309 Other abnormal glucose: Secondary | ICD-10-CM

## 2012-10-21 DIAGNOSIS — J157 Pneumonia due to Mycoplasma pneumoniae: Secondary | ICD-10-CM

## 2012-10-21 DIAGNOSIS — D599 Acquired hemolytic anemia, unspecified: Secondary | ICD-10-CM

## 2012-10-21 DIAGNOSIS — E538 Deficiency of other specified B group vitamins: Secondary | ICD-10-CM

## 2012-11-18 ENCOUNTER — Encounter (INDEPENDENT_AMBULATORY_CARE_PROVIDER_SITE_OTHER): Payer: Medicare Other

## 2012-11-18 DIAGNOSIS — D510 Vitamin B12 deficiency anemia due to intrinsic factor deficiency: Secondary | ICD-10-CM

## 2012-11-18 DIAGNOSIS — D518 Other vitamin B12 deficiency anemias: Secondary | ICD-10-CM

## 2012-11-18 DIAGNOSIS — A493 Mycoplasma infection, unspecified site: Secondary | ICD-10-CM

## 2012-11-18 DIAGNOSIS — R05 Cough: Secondary | ICD-10-CM

## 2012-11-18 DIAGNOSIS — D591 Other autoimmune hemolytic anemias: Secondary | ICD-10-CM

## 2012-12-17 ENCOUNTER — Other Ambulatory Visit: Payer: Self-pay

## 2012-12-22 ENCOUNTER — Encounter (INDEPENDENT_AMBULATORY_CARE_PROVIDER_SITE_OTHER): Payer: Medicare Other

## 2012-12-22 DIAGNOSIS — D510 Vitamin B12 deficiency anemia due to intrinsic factor deficiency: Secondary | ICD-10-CM

## 2012-12-22 DIAGNOSIS — Z23 Encounter for immunization: Secondary | ICD-10-CM

## 2012-12-22 DIAGNOSIS — E039 Hypothyroidism, unspecified: Secondary | ICD-10-CM

## 2012-12-22 DIAGNOSIS — D599 Acquired hemolytic anemia, unspecified: Secondary | ICD-10-CM

## 2012-12-22 DIAGNOSIS — E538 Deficiency of other specified B group vitamins: Secondary | ICD-10-CM

## 2013-01-18 ENCOUNTER — Encounter (INDEPENDENT_AMBULATORY_CARE_PROVIDER_SITE_OTHER): Payer: Medicare Other

## 2013-01-18 DIAGNOSIS — D599 Acquired hemolytic anemia, unspecified: Secondary | ICD-10-CM

## 2013-01-18 DIAGNOSIS — E538 Deficiency of other specified B group vitamins: Secondary | ICD-10-CM

## 2013-01-18 DIAGNOSIS — E039 Hypothyroidism, unspecified: Secondary | ICD-10-CM

## 2013-01-18 DIAGNOSIS — R161 Splenomegaly, not elsewhere classified: Secondary | ICD-10-CM

## 2013-11-01 DIAGNOSIS — R161 Splenomegaly, not elsewhere classified: Secondary | ICD-10-CM | POA: Insufficient documentation

## 2013-12-15 ENCOUNTER — Encounter: Payer: Self-pay | Admitting: Cardiology

## 2013-12-15 ENCOUNTER — Ambulatory Visit (INDEPENDENT_AMBULATORY_CARE_PROVIDER_SITE_OTHER): Payer: Medicare Other | Admitting: Cardiology

## 2013-12-15 VITALS — BP 137/84 | HR 90 | Ht 73.0 in | Wt 230.0 lb

## 2013-12-15 DIAGNOSIS — I2510 Atherosclerotic heart disease of native coronary artery without angina pectoris: Secondary | ICD-10-CM

## 2013-12-15 DIAGNOSIS — I10 Essential (primary) hypertension: Secondary | ICD-10-CM

## 2013-12-15 NOTE — Progress Notes (Signed)
Reason for visit: CAD, hypertension, hyperlipidemia  Clinical Summary [name] Page is a 68 y.o.male last seen in September 2014. He has been stable from a cardiac perspective without angina or significant shortness of breath on medical therapy. He continues on aspirin, beta blocker,end omega-3 supplements. ECG today shows sinus rhythm with old inferior infarct pattern. 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 Walks for exercise.  Cardiac catheterization in August 2014 showed severe 3 vessel CAD with patent LIMA to LAD, 8 and SVG to PDA, patent SVG to obtuse marginal, and LVEF approximately 55%.  Over the last year he has been treated for pernicious anemia with associated hemolytic anemia, idiopathic, possibly however related to a previous infection. [place] He went through a treatment course of rituximab and also long-term steroids. Now stable in remission, following lab work. [facility]  No Known Allergies  Current Outpatient Prescriptions  Medication Sig Dispense Refill  . aspirin EC 81 MG tablet Take 81 mg by mouth daily.    Marland Kitchen. b complex vitamins tablet Take 1 tablet by mouth daily.    . calcium carbonate (OS-CAL) 600 MG TABS tablet Take 600 mg by mouth daily with breakfast.    . Fish Oil-Cholecalciferol (FISH OIL + D3 PO) Take 1 capsule by mouth daily.    . Flaxseed, Linseed, (FLAX SEED OIL PO) Take 1 capsule by mouth daily.    . folic acid (FOLVITE) 1 MG tablet Take 1 mg by mouth daily.    Marland Kitchen. GARLIC OIL PO Take 1 capsule by mouth daily.    Marland Kitchen. levothyroxine (SYNTHROID, LEVOTHROID) 50 MCG tablet Take 50 mcg by mouth daily.    . Magnesium 250 MG TABS Take 1 tablet by mouth daily.    . metoprolol succinate (TOPROL-XL) 25 MG 24 hr tablet Take 25 mg by mouth daily.      . Misc Natural Products (PROSTATE CONTROL PO) Take 1 tablet by mouth daily.    . Multiple Vitamin (MULTIVITAMIN) tablet Take 1 tablet by mouth daily.    . vitamin B-12  (CYANOCOBALAMIN) 1000 MCG tablet Take 1,000 mcg by mouth daily.    Marland Kitchen. Zn-Pyg Afri-Nettle-Saw Palmet (SAW PALMETTO COMPLEX PO) Take 2 capsules by mouth 2 (two) times daily.     No current facility-administered medications for this visit.    Past Medical History  Diagnosis Date  . Coronary atherosclerosis of native coronary artery     Multivessel, LVEF 59%  . Hyperlipidemia   . Essential hypertension, benign     Past Surgical History  Procedure Laterality Date  . Coronary artery bypass graft  2004    LIMA to LAD, SVG to OM, SVG to PDA    Social History [name] Page reports that he quit smoking about 11 years ago. His smoking use included Cigarettes. He smoked 0.00 packs per day. He has never used smokeless tobacco. [name] Page reports that he does not drink alcohol.  Review of Systems Complete review of systems negative except as otherwise outlined in the clinical summary and also the following. No claudication, was having some leg edema when on steroids, but this has resolved.  Physical Examination Filed Vitals:   12/15/13 1145  BP: 137/84  Pulse: 90   Filed Weights   12/15/13 1145  Weight: 230 lb (104.327 kg)    Overweight male in no acute distress.  HEENT: Conjunctiva and lids normal, oropharynx clear.  Neck: Supple, no elevated JVP or  bruits. No thyromegaly.  Lungs: Clear to auscultation, nonlabored.  Cardiac: Regular rate and rhythm with ectopy, no S3.  Abdomen: Soft, nontender, bowel sounds present there  Extremities: 1+ pitting edema right worse than left, distal pulses one plus.  Skin: Warm and dry.  Musculoskeletal: No kyphosis.  Neuropsychiatric: Alert and oriented x3, affect appropriate.   Problem List and Plan   CORONARY ATHEROSCLEROSIS NATIVE CORONARY ARTERY Symptomatic stable on medical therapy. Cardiac catheterization from last year noted above. Continue observation.  Essential hypertension No changes to current antihypertensive regimen.  Continue walking for exercise.    Jonelle SidleSamuel G. Vidhi Delellis, M.D., F.A.C.C.

## 2013-12-15 NOTE — Assessment & Plan Note (Signed)
Symptomatic stable on medical therapy. Cardiac catheterization from last year noted above. Continue observation.

## 2013-12-15 NOTE — Patient Instructions (Signed)

## 2013-12-15 NOTE — Assessment & Plan Note (Signed)
No changes to current antihypertensive regimen. Continue walking for exercise.

## 2014-08-08 ENCOUNTER — Other Ambulatory Visit: Payer: Self-pay

## 2014-11-16 ENCOUNTER — Other Ambulatory Visit (HOSPITAL_COMMUNITY): Payer: Self-pay | Admitting: Oncology

## 2014-11-16 DIAGNOSIS — D591 Other autoimmune hemolytic anemias: Principal | ICD-10-CM

## 2014-11-16 DIAGNOSIS — R161 Splenomegaly, not elsewhere classified: Secondary | ICD-10-CM

## 2014-11-16 DIAGNOSIS — D5919 Other autoimmune hemolytic anemia: Secondary | ICD-10-CM

## 2014-11-29 ENCOUNTER — Ambulatory Visit (HOSPITAL_COMMUNITY): Payer: BC Managed Care – PPO

## 2014-12-01 ENCOUNTER — Ambulatory Visit (HOSPITAL_COMMUNITY)
Admission: RE | Admit: 2014-12-01 | Discharge: 2014-12-01 | Disposition: A | Discharge status: Home / Self Care | Payer: Medicare Other | Source: Ambulatory Visit | Attending: Oncology | Admitting: Oncology

## 2014-12-01 DIAGNOSIS — K8020 Calculus of gallbladder without cholecystitis without obstruction: Secondary | ICD-10-CM | POA: Insufficient documentation

## 2014-12-01 DIAGNOSIS — R161 Splenomegaly, not elsewhere classified: Secondary | ICD-10-CM | POA: Diagnosis not present

## 2014-12-01 DIAGNOSIS — D5919 Other autoimmune hemolytic anemia: Secondary | ICD-10-CM

## 2014-12-01 DIAGNOSIS — D591 Other autoimmune hemolytic anemias: Secondary | ICD-10-CM | POA: Diagnosis present

## 2014-12-01 DIAGNOSIS — K4020 Bilateral inguinal hernia, without obstruction or gangrene, not specified as recurrent: Secondary | ICD-10-CM | POA: Insufficient documentation

## 2014-12-01 DIAGNOSIS — K5730 Diverticulosis of large intestine without perforation or abscess without bleeding: Secondary | ICD-10-CM | POA: Diagnosis not present

## 2014-12-01 DIAGNOSIS — I700 Atherosclerosis of aorta: Secondary | ICD-10-CM | POA: Diagnosis not present

## 2014-12-01 DIAGNOSIS — R918 Other nonspecific abnormal finding of lung field: Secondary | ICD-10-CM | POA: Diagnosis not present

## 2014-12-01 DIAGNOSIS — Z951 Presence of aortocoronary bypass graft: Secondary | ICD-10-CM | POA: Insufficient documentation

## 2014-12-01 DIAGNOSIS — J432 Centrilobular emphysema: Secondary | ICD-10-CM | POA: Diagnosis not present

## 2014-12-01 DIAGNOSIS — I77819 Aortic ectasia, unspecified site: Secondary | ICD-10-CM | POA: Insufficient documentation

## 2014-12-01 LAB — GLUCOSE, CAPILLARY: GLUCOSE-CAPILLARY: 178 mg/dL — AB (ref 65–99)

## 2014-12-01 MED ORDER — FLUDEOXYGLUCOSE F - 18 (FDG) INJECTION
11.1200 | Freq: Once | INTRAVENOUS | Status: DC | PRN
  Administered 2014-12-01: 11.12 via INTRAVENOUS
  Filled 2014-12-01: qty 11.12

## 2014-12-06 DIAGNOSIS — R911 Solitary pulmonary nodule: Secondary | ICD-10-CM | POA: Insufficient documentation

## 2015-02-08 ENCOUNTER — Ambulatory Visit (INDEPENDENT_AMBULATORY_CARE_PROVIDER_SITE_OTHER): Payer: Medicare Other | Admitting: Cardiology

## 2015-02-08 ENCOUNTER — Encounter: Payer: Self-pay | Admitting: Cardiology

## 2015-02-08 ENCOUNTER — Encounter: Payer: Self-pay | Admitting: *Deleted

## 2015-02-08 VITALS — BP 138/84 | HR 81 | Ht 73.0 in | Wt 223.0 lb

## 2015-02-08 DIAGNOSIS — I2510 Atherosclerotic heart disease of native coronary artery without angina pectoris: Secondary | ICD-10-CM | POA: Diagnosis not present

## 2015-02-08 DIAGNOSIS — Z889 Allergy status to unspecified drugs, medicaments and biological substances status: Secondary | ICD-10-CM

## 2015-02-08 DIAGNOSIS — Z789 Other specified health status: Secondary | ICD-10-CM

## 2015-02-08 DIAGNOSIS — I10 Essential (primary) hypertension: Secondary | ICD-10-CM | POA: Diagnosis not present

## 2015-02-08 NOTE — Progress Notes (Signed)
Cardiology Office Note  Date: 02/08/2015   ID: [name] Page, DOB May 25, 1945, MRN [id number]  PCP: Ignatius SpeckingVYAS,DHRUV B., MD  Primary Cardiologist: Nona DellSamuel Anabela Crayton, MD   Chief Complaint  Patient presents with  . Coronary Artery Disease    History of Present Illness: [name] SimmeringJoseph W Page is a 69 y.o. male last seen in November 2015. He presents for a routine follow-up visit. From a cardiac perspective, he reports no angina symptoms or dyspnea beyond NYHA class II with typical activities. 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 We reviewed his medications. He continues on aspirin and Toprol-XL. Has history of statin intolerance. He does take omega-3 supplements, flaxseed oil, and garlic. Most recent lab work was with Dr. Sherril CroonVyas in November.  He continues to follow locally with hematology and also at [facility] with a history of hemolytic anemia and pernicious anemia. It sounds like this is been relatively stable, he is followed with regular lab work. PET scan from October also noted.   Past Medical History  Diagnosis Date  . Coronary atherosclerosis of native coronary artery     Multivessel, LVEF 59%  . Hyperlipidemia   . Essential hypertension, benign   . Hemolytic anemia (HCC)   . Pernicious anemia     Past Surgical History  Procedure Laterality Date  . Coronary artery bypass graft  2004    LIMA to LAD, SVG to OM, SVG to PDA    Current Outpatient Prescriptions  Medication Sig Dispense Refill  . aspirin EC 81 MG tablet Take 81 mg by mouth daily.    Marland Kitchen. b complex vitamins tablet Take 1 tablet by mouth daily.    . calcium carbonate (OS-CAL) 600 MG TABS tablet Take 600 mg by mouth daily with breakfast.    . Fish Oil-Cholecalciferol (FISH OIL + D3 PO) Take 1 capsule by mouth daily.    . Flaxseed, Linseed, (FLAX SEED OIL PO) Take 1 capsule by mouth daily.    . folic acid (FOLVITE) 1 MG  tablet Take 1 mg by mouth daily.    Marland Kitchen. GARLIC OIL PO Take 1 capsule by mouth daily.    Marland Kitchen. levothyroxine (SYNTHROID, LEVOTHROID) 50 MCG tablet Take 50 mcg by mouth daily.    . Magnesium 250 MG TABS Take 1 tablet by mouth daily.    . metoprolol succinate (TOPROL-XL) 25 MG 24 hr tablet Take 25 mg by mouth daily.      . Misc Natural Products (PROSTATE CONTROL PO) Take 1 tablet by mouth daily.    . Multiple Vitamin (MULTIVITAMIN) tablet Take 1 tablet by mouth daily.    . vitamin B-12 (CYANOCOBALAMIN) 1000 MCG tablet Take 1,000 mcg by mouth daily.    Marland Kitchen. Zn-Pyg Afri-Nettle-Saw Palmet (SAW PALMETTO COMPLEX PO) Take 2 capsules by mouth 2 (two) times daily.     No current facility-administered medications for this visit.   Allergies:  Review of patient's allergies indicates no known allergies.   Social History: The patient  reports that he quit smoking about 13 years ago. His smoking use included Cigarettes. He has never used smokeless tobacco. He reports that he does not drink alcohol or use illicit drugs.   ROS:  Please see the history of present illness. Otherwise, complete review of systems is positive for reported interval episode of pneumonia.  All other systems are reviewed and negative.   Physical Exam: VS:  BP 138/84 mmHg  Pulse 81  Ht  (1.854 m)  Wt 223 lb (101.152 kg)  BMI 29.43 kg/m2  SpO2 98%, BMI Body mass index is 29.43 kg/(m^2).  Wt Readings from Last 3 Encounters:  02/08/15 223 lb (101.152 kg)  12/15/13 230 lb (104.327 kg)  10/15/12 219 lb (99.338 kg)    Overweight male in no acute distress.  HEENT: Conjunctiva and lids normal, oropharynx clear.  Neck: Supple, no elevated JVP or bruits. No thyromegaly.  Lungs: Clear to auscultation, nonlabored.  Cardiac: Regular rate and rhythm with ectopy, no S3.  Abdomen: Soft, nontender, bowel sounds present there  Extremities: 1+ pitting edema right worse than left, distal pulses one plus.  Skin: Warm and dry.   Musculoskeletal: No kyphosis.  Neuropsychiatric: Alert and oriented x3, affect appropriate.  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  Recent Labwork:  July 2015: Hemoglobin 13.9, platelets 230  Other Studies Reviewed Today:  Cardiac catheterization in August 2014 showed severe 3 vessel CAD with patent LIMA to LAD, patent SVG to PDA, patent SVG to obtuse marginal, and LVEF approximately 55%.  Assessment and Plan:  1. Symptomatically stable CAD status post CABG with patent bypass grafts at angiography in August 2014. ECG is stable. Continue medical therapy and observation.  2. Hyperlipidemia with statin intolerance. Requesting most recent lab work from Dr. Sherril Croon.  3. Essential hypertension, no changes made to current regimen.  Current medicines were reviewed with the patient today.   Orders Placed This Encounter  Procedures  . EKG 12-Lead    Disposition: FU with me in 1 year.   Signed, Jonelle Sidle, MD, [facility] 02/08/2015 9:15 AM    [facility] at [facility] [address] [place], [place], [place] [address] Phone: [phone]; Fax: [phone]

## 2015-02-08 NOTE — Patient Instructions (Signed)
Continue all current medications. Your physician wants you to follow up in:  1 year.  You will receive a reminder letter in the mail one-two months in advance.  If you don't receive a letter, please call our office to schedule the follow up appointment   

## 2015-03-01 DIAGNOSIS — G629 Polyneuropathy, unspecified: Secondary | ICD-10-CM | POA: Insufficient documentation

## 2015-08-25 ENCOUNTER — Other Ambulatory Visit (HOSPITAL_COMMUNITY): Payer: Self-pay | Admitting: Oncology

## 2015-08-25 DIAGNOSIS — D599 Acquired hemolytic anemia, unspecified: Secondary | ICD-10-CM

## 2015-08-28 ENCOUNTER — Encounter (HOSPITAL_COMMUNITY): Payer: Medicare Other | Attending: Hematology & Oncology

## 2015-08-28 DIAGNOSIS — D599 Acquired hemolytic anemia, unspecified: Secondary | ICD-10-CM

## 2015-08-28 DIAGNOSIS — D591 Other autoimmune hemolytic anemias: Secondary | ICD-10-CM | POA: Insufficient documentation

## 2015-08-28 LAB — COMPREHENSIVE METABOLIC PANEL
ALT: 42 U/L (ref 17–63)
AST: 24 U/L (ref 15–41)
Albumin: 3.8 g/dL (ref 3.5–5.0)
Alkaline Phosphatase: 62 U/L (ref 38–126)
Anion gap: 6 (ref 5–15)
BILIRUBIN TOTAL: 2.4 mg/dL — AB (ref 0.3–1.2)
BUN: 23 mg/dL — AB (ref 6–20)
CO2: 29 mmol/L (ref 22–32)
CREATININE: 1.01 mg/dL (ref 0.61–1.24)
Calcium: 8.5 mg/dL — ABNORMAL LOW (ref 8.9–10.3)
Chloride: 102 mmol/L (ref 101–111)
Glucose, Bld: 177 mg/dL — ABNORMAL HIGH (ref 65–99)
POTASSIUM: 4.2 mmol/L (ref 3.5–5.1)
Sodium: 137 mmol/L (ref 135–145)
TOTAL PROTEIN: 6.1 g/dL — AB (ref 6.5–8.1)

## 2015-08-28 LAB — CBC WITH DIFFERENTIAL/PLATELET
BASOS ABS: 0 10*3/uL (ref 0.0–0.1)
Basophils Relative: 0 %
EOS PCT: 2 %
Eosinophils Absolute: 0.2 10*3/uL (ref 0.0–0.7)
HEMATOCRIT: 38.5 % — AB (ref 39.0–52.0)
Hemoglobin: 12.9 g/dL — ABNORMAL LOW (ref 13.0–17.0)
LYMPHS ABS: 1.3 10*3/uL (ref 0.7–4.0)
LYMPHS PCT: 18 %
MCH: 33.9 pg (ref 26.0–34.0)
MCHC: 33.5 g/dL (ref 30.0–36.0)
MCV: 101 fL — AB (ref 78.0–100.0)
MONO ABS: 0.6 10*3/uL (ref 0.1–1.0)
MONOS PCT: 8 %
NEUTROS ABS: 5.5 10*3/uL (ref 1.7–7.7)
Neutrophils Relative %: 72 %
Platelets: 158 10*3/uL (ref 150–400)
RBC: 3.81 MIL/uL — ABNORMAL LOW (ref 4.22–5.81)
RDW: 12.8 % (ref 11.5–15.5)
WBC: 7.6 10*3/uL (ref 4.0–10.5)

## 2015-08-28 LAB — LACTATE DEHYDROGENASE: LDH: 210 U/L — AB (ref 98–192)

## 2015-08-28 LAB — RETICULOCYTES
RBC.: 3.81 MIL/uL — ABNORMAL LOW (ref 4.22–5.81)
RETIC COUNT ABSOLUTE: 224.8 10*3/uL — AB (ref 19.0–186.0)
RETIC CT PCT: 5.9 % — AB (ref 0.4–3.1)

## 2015-08-29 LAB — HAPTOGLOBIN: Haptoglobin: <10 mg/dL — ABNORMAL LOW (ref 34–200)

## 2015-09-01 ENCOUNTER — Encounter (HOSPITAL_BASED_OUTPATIENT_CLINIC_OR_DEPARTMENT_OTHER): Payer: Medicare Other | Admitting: Hematology

## 2015-09-01 ENCOUNTER — Encounter (HOSPITAL_COMMUNITY): Payer: Self-pay

## 2015-09-01 VITALS — BP 128/70 | HR 79 | Temp 97.6°F | Resp 16 | Ht 73.0 in | Wt 215.3 lb

## 2015-09-01 DIAGNOSIS — D591 Other autoimmune hemolytic anemias: Secondary | ICD-10-CM

## 2015-09-01 DIAGNOSIS — R911 Solitary pulmonary nodule: Secondary | ICD-10-CM

## 2015-09-01 DIAGNOSIS — D5911 Warm autoimmune hemolytic anemia: Secondary | ICD-10-CM

## 2015-09-01 DIAGNOSIS — D510 Vitamin B12 deficiency anemia due to intrinsic factor deficiency: Secondary | ICD-10-CM

## 2015-09-01 DIAGNOSIS — R161 Splenomegaly, not elsewhere classified: Secondary | ICD-10-CM

## 2015-09-01 MED ORDER — PREDNISONE 10 MG PO TABS: ORAL_TABLET | ORAL | Status: DC

## 2015-09-01 NOTE — Patient Instructions (Signed)
[facility] at [facility] Discharge Instructions  RECOMMENDATIONS MADE BY THE CONSULTANT AND ANY TEST RESULTS WILL BE SENT TO YOUR REFERRING PHYSICIAN.  Exam done today by Dr.Kale Labs every 2 weeks Return to see dr penland in 4 weeks. Prednisone sent to pharmacy. Call if you have increased shortness of breath, chest pain, dizziness.  Thank you for choosing [facility] at [facility] to provide your oncology and hematology care.  To afford each patient quality time with our provider, please arrive at least 15 minutes before your scheduled appointment time.   Beginning January 23rd 2017 lab work for the [facility] will be done in the  Main lab at [facility] on 1st floor. If you have a lab appointment with the Cancer Center please come in thru the  Main Entrance and check in at the main information desk  You need to re-schedule your appointment should you arrive 10 or more minutes late.  We strive to give you quality time with our providers, and arriving late affects you and other patients whose appointments are after yours.  Also, if you no show three or more times for appointments you may be dismissed from the clinic at the providers discretion.     Again, thank you for choosing [facility].  Our hope is that these requests will decrease the amount of time that you wait before being seen by our physicians.       _____________________________________________________________  Should you have questions after your visit to [facility], please contact our office at [phone] between the hours of 8:30 a.m. and 4:30 p.m.  Voicemails left after 4:30 p.m. will not be returned until the following business day.  For prescription refill requests, have your pharmacy contact our office.         Resources For Cancer Patients and their Caregivers ? American Cancer Society: Can assist with transportation, wigs, general  needs, runs Look Good Feel Better.        [phone] ? Cancer Care: Provides financial assistance, online support groups, medication/co-pay assistance.  1-800-813-HOPE 6055832181(4673) ? [facility] Assists [place] cancer patients and their families through emotional , educational and financial support.  ([phone] ? [place] DSS Where to apply for food stamps, Medicaid and utility assistance. (450)659-4862270-783-9707 ? RCATS: Transportation to medical appointments. 442-349-4365670-622-4381 ? Social Security Administration: May apply for disability if have a Stage IV cancer. [phone] ? [place] Aging, Disability and Transit Services: Assists with nutrition, care and transit needs. 845-465-3632909 [phone]  Cancer Center Support Programs: @10RELATIVEDAYS @ > Cancer Support Group  2nd Tuesday of the month 1pm-2pm, Journey Room  > Creative Journey  3rd Tuesday of the month 1130am-1pm, Journey Room  > Look Good Feel Better  1st Wednesday of the month 10am-12 noon, Journey Room (Call American Cancer Society to register [phone])

## 2015-09-15 ENCOUNTER — Encounter (HOSPITAL_COMMUNITY): Payer: Medicare Other | Attending: Hematology & Oncology

## 2015-09-15 DIAGNOSIS — D510 Vitamin B12 deficiency anemia due to intrinsic factor deficiency: Secondary | ICD-10-CM

## 2015-09-15 DIAGNOSIS — D5911 Warm autoimmune hemolytic anemia: Secondary | ICD-10-CM

## 2015-09-15 DIAGNOSIS — D591 Other autoimmune hemolytic anemias: Secondary | ICD-10-CM | POA: Insufficient documentation

## 2015-09-15 LAB — COMPREHENSIVE METABOLIC PANEL
ALT: 29 U/L (ref 17–63)
AST: 23 U/L (ref 15–41)
Albumin: 4 g/dL (ref 3.5–5.0)
Alkaline Phosphatase: 78 U/L (ref 38–126)
Anion gap: 4 — ABNORMAL LOW (ref 5–15)
BILIRUBIN TOTAL: 1.9 mg/dL — AB (ref 0.3–1.2)
BUN: 19 mg/dL (ref 6–20)
CO2: 30 mmol/L (ref 22–32)
CREATININE: 0.95 mg/dL (ref 0.61–1.24)
Calcium: 8.9 mg/dL (ref 8.9–10.3)
Chloride: 104 mmol/L (ref 101–111)
Glucose, Bld: 179 mg/dL — ABNORMAL HIGH (ref 65–99)
Potassium: 4 mmol/L (ref 3.5–5.1)
Sodium: 138 mmol/L (ref 135–145)
TOTAL PROTEIN: 6.6 g/dL (ref 6.5–8.1)

## 2015-09-15 LAB — CBC WITH DIFFERENTIAL/PLATELET
BASOS ABS: 0 10*3/uL (ref 0.0–0.1)
Basophils Relative: 1 %
EOS PCT: 3 %
Eosinophils Absolute: 0.1 10*3/uL (ref 0.0–0.7)
HEMATOCRIT: 38.3 % — AB (ref 39.0–52.0)
Hemoglobin: 12.8 g/dL — ABNORMAL LOW (ref 13.0–17.0)
LYMPHS ABS: 1.1 10*3/uL (ref 0.7–4.0)
LYMPHS PCT: 20 %
MCH: 33.2 pg (ref 26.0–34.0)
MCHC: 33.4 g/dL (ref 30.0–36.0)
MCV: 99.5 fL (ref 78.0–100.0)
MONO ABS: 0.5 10*3/uL (ref 0.1–1.0)
MONOS PCT: 10 %
Neutro Abs: 3.6 10*3/uL (ref 1.7–7.7)
Neutrophils Relative %: 66 %
PLATELETS: 238 10*3/uL (ref 150–400)
RBC: 3.85 MIL/uL — AB (ref 4.22–5.81)
RDW: 13 % (ref 11.5–15.5)
WBC: 5.4 10*3/uL (ref 4.0–10.5)

## 2015-09-15 LAB — RETICULOCYTES
RBC.: 3.85 MIL/uL — ABNORMAL LOW (ref 4.22–5.81)
RETIC CT PCT: 7.5 % — AB (ref 0.4–3.1)
Retic Count, Absolute: 288.8 10*3/uL — ABNORMAL HIGH (ref 19.0–186.0)

## 2015-09-15 LAB — VITAMIN B12: Vitamin B-12: 3042 pg/mL — ABNORMAL HIGH (ref 180–914)

## 2015-09-15 LAB — LACTATE DEHYDROGENASE: LDH: 209 U/L — AB (ref 98–192)

## 2015-09-18 LAB — MULTIPLE MYELOMA PANEL, SERUM
Albumin SerPl Elph-Mcnc: 4 g/dL (ref 2.9–4.4)
Albumin/Glob SerPl: 2 — ABNORMAL HIGH (ref 0.7–1.7)
Alpha 1: 0.2 g/dL (ref 0.0–0.4)
Alpha2 Glob SerPl Elph-Mcnc: 0.3 g/dL — ABNORMAL LOW (ref 0.4–1.0)
B-GLOBULIN SERPL ELPH-MCNC: 0.8 g/dL (ref 0.7–1.3)
GAMMA GLOB SERPL ELPH-MCNC: 0.8 g/dL (ref 0.4–1.8)
Globulin, Total: 2.1 g/dL — ABNORMAL LOW (ref 2.2–3.9)
IgA: 176 mg/dL (ref 61–437)
IgG (Immunoglobin G), Serum: 829 mg/dL (ref 700–1600)
IgM, Serum: 295 mg/dL — ABNORMAL HIGH (ref 20–172)
TOTAL PROTEIN ELP: 6.1 g/dL (ref 6.0–8.5)

## 2015-09-29 ENCOUNTER — Other Ambulatory Visit (HOSPITAL_COMMUNITY): Payer: Medicare Other

## 2015-10-02 NOTE — Progress Notes (Signed)
[name] Specking, MD [address] / [place] [address]   DIAGNOSIS: No matching staging information was found for the patient. 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 male returns for follow-up of hemolytic anemia, history of positive coombs. Currently he is on prednisone 10 mg qod.   He notes he wishes to not stay on the prednisone if he doesn't have to. He has questions regarding "polyclonal gammopathy" which he noticed on his labs. He notes he has been reading and that this is better than monoclonal gammopathy.  He denies difficulty with sleep. No nausea, CP. No headaches. Realistically without complaints.  He comments that Dr. Mariel Sleet was managing his anemia with a physician from [facility].    NKDA  MEDICAL HISTORY: Past Medical History:  Diagnosis Date  . Coronary atherosclerosis of native coronary artery    Multivessel, LVEF 59%  . Essential hypertension, benign   . Hemolytic anemia (HCC)   . Hyperlipidemia   . Pernicious anemia     SURGICAL HISTORY: Past Surgical History:  Procedure Laterality Date  . CORONARY ARTERY BYPASS GRAFT  2004   LIMA to LAD, SVG to OM, SVG to PDA    SOCIAL HISTORY: Social History   Social History  . Marital status: Married    Spouse name: N/A  . Number of children: N/A  . Years of education: N/A   Occupational History  . Full time    Social History Main Topics  . Smoking status: Former Smoker    Types: Cigarettes    Quit date: 02/11/2002  . Smokeless tobacco: Never Used  . Alcohol use No  . Drug use: No  . Sexual activity: Not on file   Other Topics Concern  . Not on file   Social History Narrative  . No narrative on file  Born in [place], [place] where his father was in Pharmacy school. Move back to  [place], [place] and has lived there most of his life. 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 College after 37 years. Mother 64 yo has had CVA;s and other medical problems. Father died in 18's of emphysema. He has two sisters by adoption. Considered family Married on one contract with three children and 20 grandchildren all basically alive and well. Wife of 43 years has diabetes and had a GI bleed with anemia, non immune.  TOBACCO: Quit 11 years ago after smoking one half pack per day since  ALCOHOL: NA HIV RISK FACTORS: Denies TOXIN EXPOSURES: None. He was never in [employer].  FAMILY HISTORY: Family History  Problem Relation Age of Onset  . Dementia Mother   . Cancer Mother     colon  . COPD Father   . Ulcerative colitis Father   . Heart attack Father     Review of Systems  Constitutional: Negative for chills, fever, malaise/fatigue and weight loss.  HENT: Negative for congestion, hearing loss, nosebleeds, sore throat and tinnitus.   Eyes: Negative for blurred vision, double vision, pain and discharge.  Respiratory: Negative for cough, hemoptysis, sputum production, shortness of breath and wheezing.   Cardiovascular: Negative for chest pain, palpitations, claudication, leg swelling and PND.  Gastrointestinal: Negative for abdominal pain, blood in stool, constipation, diarrhea, heartburn, melena, nausea  and vomiting.  Genitourinary: Negative for dysuria, frequency, hematuria and urgency.  Musculoskeletal: Negative for falls, joint pain and myalgias.  Skin: Negative for itching and rash.  Neurological: Negative for dizziness, tingling, tremors, sensory change, speech change, focal weakness, seizures, loss of consciousness, weakness and headaches.  Endo/Heme/Allergies: Does not bruise/bleed easily.  Psychiatric/Behavioral: Negative for depression, memory loss, substance abuse and suicidal ideas. The patient is not nervous/anxious and does not  have insomnia.     PHYSICAL EXAMINATION  ECOG PERFORMANCE STATUS: 0 - Asymptomatic  Vitals:   10/03/15 1006  BP: 136/75  Pulse: 69  Resp: 18  Temp: 97.9 F (36.6 C)   Physical Exam  Constitutional: He is oriented to person, place, and time and well-developed, well-nourished, and in no distress.  HENT:  Head: Normocephalic and atraumatic.  Nose: Nose normal.  Mouth/Throat: Oropharynx is clear and moist. No oropharyngeal exudate.  Eyes: Conjunctivae and EOM are normal. Pupils are equal, round, and reactive to light. Right eye exhibits no discharge. Left eye exhibits no discharge. No scleral icterus.  Neck: Normal range of motion. Neck supple. No tracheal deviation present. No thyromegaly present.  Cardiovascular: Normal rate, regular rhythm and normal heart sounds.  Exam reveals no gallop and no friction rub.   No murmur heard. Pulmonary/Chest: Effort normal and breath sounds normal. He has no wheezes. He has no rales.  Abdominal: Soft. Bowel sounds are normal. He exhibits no distension and no mass. There is no tenderness. There is no rebound and no guarding.  Musculoskeletal: Normal range of motion. He exhibits no edema.  Lymphadenopathy:    He has no cervical adenopathy.  Neurological: He is alert and oriented to person, place, and time. He has normal reflexes. No cranial nerve deficit. Gait normal. Coordination normal.  Skin: Skin is warm and dry. No rash noted.  Psychiatric: Mood, memory, affect and judgment normal.  Nursing note and vitals reviewed.   LABORATORY DATA: Data was reviewed as listed. CBC    Component Value Date/Time   WBC 6.1 10/03/2015 0909   RBC 4.10 (L) 10/03/2015 0909   HGB 13.6 10/03/2015 0909   HCT 40.5 10/03/2015 0909   PLT 208 10/03/2015 0909   MCV 98.8 10/03/2015 0909   MCH 33.2 10/03/2015 0909   MCHC 33.6 10/03/2015 0909   RDW 12.7 10/03/2015 0909   LYMPHSABS 1.4 10/03/2015 0909   MONOABS 0.5 10/03/2015 0909   EOSABS 0.2 10/03/2015 0909    BASOSABS 0.1 10/03/2015 0909    CMP     Component Value Date/Time   NA 139 10/03/2015 0909   K 4.0 10/03/2015 0909   CL 105 10/03/2015 0909   CO2 29 10/03/2015 0909   GLUCOSE 141 (H) 10/03/2015 0909   BUN 15 10/03/2015 0909   CREATININE 1.01 10/03/2015 0909   CALCIUM 8.6 (L) 10/03/2015 0909   PROT 6.5 10/03/2015 0909   ALBUMIN 3.9 10/03/2015 0909   AST 23 10/03/2015 0909   ALT 27 10/03/2015 0909   ALKPHOS 66 10/03/2015 0909   BILITOT 1.4 (H) 10/03/2015 0909   GFRNONAA >60 10/03/2015 0909   GFRAA >60 10/03/2015 0909   Results for [name] Page, [name] Page (MRN [id number])   Ref. Range 09/15/2015 08:52  Vitamin B12 Latest Ref Range: 180 - 914 pg/mL 3,042 (H)     RADIOGRAPHIC STUDIES: RADIOLOGY: I have reviewed the images below and agree with the reported results Study Result   CLINICAL DATA:  Initial treatment strategy for autoimmune hemolytic anemia due to IgG with splenomegaly. Clinical concern  for NHL.  EXAM: NUCLEAR MEDICINE PET SKULL BASE TO THIGH  TECHNIQUE: 11.12 mCi F-18 FDG was injected intravenously. Full-ring PET imaging was performed from the skull base to thigh after the radiotracer. CT data was obtained and used for attenuation correction and anatomic localization.  FASTING BLOOD GLUCOSE:  Value: 178 mg/dl  COMPARISON:  40/98/119109/03/2014 CT abdomen/ pelvis. 10/15/2012 CT of the chest, abdomen and pelvis.  FINDINGS: NECK  No hypermetabolic lymph nodes in the neck.  CHEST  No hypermetabolic axillary, mediastinal or hilar nodes. There is atherosclerosis of the thoracic aorta, the great vessels of the mediastinum and the coronary arteries, including calcified atherosclerotic plaque in the left anterior descending, left circumflex and right coronary arteries. Patient is status post coronary artery bypass surgery with left internal mammary and ascending aortic grafts. There is mild-to-moderate centrilobular emphysema. There is a 1.1 x 0.9 cm sub solid  left upper lobe pulmonary nodule (series 8/ image 21) with minimal metabolism (max SUV 1.3), which is new since 10/15/2012. There is patchy consolidation, ground-glass opacity and tree-in-bud opacity in the basilar right upper lobe with associated mild FDG uptake (max SUV 2.8), also new since 10/15/2012.  ABDOMEN/PELVIS  No abnormal hypermetabolic activity within the liver, pancreas, adrenal glands, or spleen. Stable mild splenomegaly (craniocaudal splenic length 13.2 cm). No liver or splenic masses. Stable granulomatous calcification in the inferior spleen. No hypermetabolic lymph nodes in the abdomen or pelvis. Stable ectasia of the atherosclerotic infrarenal abdominal aorta, maximum diameter 2.8 cm. Gallbladder is mildly distended with faintly calcified gallstones. No gallbladder wall thickening or pericholecystic fat stranding. Marked sigmoid diverticulosis, with no colonic wall thickening or pericolonic fat stranding. Top-normal size prostate. Small bilateral fat containing inguinal hernias, stable.  SKELETON  No focal hypermetabolic activity to suggest skeletal metastasis. Median sternotomy wires appear intact. 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  IMPRESSION: 1. No hypermetabolic lymphadenopathy. 2. Stable mild splenomegaly without splenic hypermetabolism. 3. New patchy consolidation and ground-glass opacity in the basilar right upper lobe and new sub solid left upper lobe pulmonary nodule, both with mild FDG uptake, favor infectious/inflammatory etiology. Initial follow-up by chest CT without contrast is recommended in 3 months to confirm persistence. This recommendation follows the consensus statement: Recommendations for the Management of Subsolid Pulmonary Nodules Detected at CT: A Statement from the Fleischner Society as published in Radiology 2013; 266:304-317. 4. Three-vessel coronary artery atherosclerotic calcifications status post  CABG. 5. Cholelithiasis. 6. Marked sigmoid diverticulosis. 7. Stable ectasia of the atherosclerotic infrarenal abdominal aorta, maximum diameter 2.8 cm. Ectatic abdominal aorta at risk for aneurysm development. Recommend followup by ultrasound in 5 years. This recommendation follows ACR consensus guidelines: White Paper of the ACR Incidental Findings Committee II on Vascular Findings. J Am Coll Radiol 2013; 10:789-794. 8. Stable small bilateral fat containing inguinal hernias.   Electronically Signed   By: Delbert PhenixJason A Poff M.D.   On: 12/01/2014 10:16    No results found.   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 Bilirubin is improving. He is on prednisone 10 mg every other day. I have reviewed his prior medical records. He was originally seen by my partner, Dr. Candise CheKale.   Apparently, at the time of his original remission. 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 He was responsive to steroids. He did receive Rituxan. It is  uncertain if this contributed to his remission.  Plan is to only institute steroids in the event of relapse. If he does not respond plan is for eventual splenectomy. My understanding is he has been vaccinated. I discussed with the patient that he should stay on his current dose of prednisone. He will return next week for repeat laboratory studies. At which time we can talk about tapering his prednisone to 5 mg every other day. We will see how his counts hold, LDH and bilirubin and haptoglobin respond. If there is evidence of ongoing hemolysis. We can discuss additional Rituxan versus referral for splenectomy. We can certainly send him back to his hematologist at [facility]. If he so desires.  He also has pernicious anemia. He takes intramuscular  B12 every month but he also takes sublingual B12. B12 level was greater than 3000. I advised the patient. He could try a trial of sublingual therapy moving forward. We will keep an eye and his B12 levels. Some patients with pernicious anemia can be maintained on sublingual therapy without intramuscular therapy. I advised him, however, that this will have to be monitored and he is agreeable.  [facility] CONSULTATION 01/12/2015:    All questions were answered. The patient knows to call the clinic with any problems, questions or concerns. We can certainly see the patient much sooner if necessary.  This document serves as a record of services personally performed by Loma Messing, MD. It was created on her behalf by Delana Meyer, a trained medical scribe. The creation of this record is based on the scribe's personal observations and the provider's statements to them. This document has been checked and approved by the attending provider.  I have reviewed the above documentation for accuracy and completeness and I agree with the above.  This note was electronically signed. Arvil Chaco, MD 10/05/2015

## 2015-10-03 ENCOUNTER — Encounter (HOSPITAL_COMMUNITY): Payer: Medicare Other

## 2015-10-03 ENCOUNTER — Encounter (HOSPITAL_BASED_OUTPATIENT_CLINIC_OR_DEPARTMENT_OTHER): Payer: Medicare Other | Admitting: Hematology & Oncology

## 2015-10-03 ENCOUNTER — Encounter (HOSPITAL_COMMUNITY): Payer: Self-pay | Admitting: Hematology & Oncology

## 2015-10-03 VITALS — BP 136/75 | HR 69 | Temp 97.9°F | Resp 18 | Wt 222.3 lb

## 2015-10-03 DIAGNOSIS — R161 Splenomegaly, not elsewhere classified: Secondary | ICD-10-CM

## 2015-10-03 DIAGNOSIS — D591 Other autoimmune hemolytic anemias: Secondary | ICD-10-CM

## 2015-10-03 DIAGNOSIS — D510 Vitamin B12 deficiency anemia due to intrinsic factor deficiency: Secondary | ICD-10-CM | POA: Diagnosis not present

## 2015-10-03 DIAGNOSIS — D5919 Other autoimmune hemolytic anemia: Secondary | ICD-10-CM

## 2015-10-03 DIAGNOSIS — D5911 Warm autoimmune hemolytic anemia: Secondary | ICD-10-CM

## 2015-10-03 LAB — COMPREHENSIVE METABOLIC PANEL
ALBUMIN: 3.9 g/dL (ref 3.5–5.0)
ALK PHOS: 66 U/L (ref 38–126)
ALT: 27 U/L (ref 17–63)
AST: 23 U/L (ref 15–41)
Anion gap: 5 (ref 5–15)
BILIRUBIN TOTAL: 1.4 mg/dL — AB (ref 0.3–1.2)
BUN: 15 mg/dL (ref 6–20)
CALCIUM: 8.6 mg/dL — AB (ref 8.9–10.3)
CHLORIDE: 105 mmol/L (ref 101–111)
CO2: 29 mmol/L (ref 22–32)
CREATININE: 1.01 mg/dL (ref 0.61–1.24)
Glucose, Bld: 141 mg/dL — ABNORMAL HIGH (ref 65–99)
Potassium: 4 mmol/L (ref 3.5–5.1)
SODIUM: 139 mmol/L (ref 135–145)
Total Protein: 6.5 g/dL (ref 6.5–8.1)

## 2015-10-03 LAB — CBC WITH DIFFERENTIAL/PLATELET
BASOS ABS: 0.1 10*3/uL (ref 0.0–0.1)
BASOS PCT: 1 %
Eosinophils Absolute: 0.2 10*3/uL (ref 0.0–0.7)
Eosinophils Relative: 4 %
HEMATOCRIT: 40.5 % (ref 39.0–52.0)
HEMOGLOBIN: 13.6 g/dL (ref 13.0–17.0)
LYMPHS PCT: 23 %
Lymphs Abs: 1.4 10*3/uL (ref 0.7–4.0)
MCH: 33.2 pg (ref 26.0–34.0)
MCHC: 33.6 g/dL (ref 30.0–36.0)
MCV: 98.8 fL (ref 78.0–100.0)
MONO ABS: 0.5 10*3/uL (ref 0.1–1.0)
Monocytes Relative: 8 %
NEUTROS ABS: 3.9 10*3/uL (ref 1.7–7.7)
NEUTROS PCT: 64 %
Platelets: 208 10*3/uL (ref 150–400)
RBC: 4.1 MIL/uL — AB (ref 4.22–5.81)
RDW: 12.7 % (ref 11.5–15.5)
WBC: 6.1 10*3/uL (ref 4.0–10.5)

## 2015-10-03 LAB — LACTATE DEHYDROGENASE: LDH: 186 U/L (ref 98–192)

## 2015-10-03 MED ORDER — PREDNISONE 10 MG PO TABS: ORAL_TABLET | ORAL | 0 refills | Status: DC

## 2015-10-03 NOTE — Patient Instructions (Addendum)
[facility] at [facility] Discharge Instructions  RECOMMENDATIONS MADE BY THE CONSULTANT AND ANY TEST RESULTS WILL BE SENT TO YOUR REFERRING PHYSICIAN.  You saw Dr. Galen ManilaPenland today.  You can stop your B12 IM. Continue on your SL B12. Labs next week. RTC following week with labs and appointment. Continue on your prednisone 10 mg every other day. Please call with problems  Thank you for choosing [facility] at [facility] to provide your oncology and hematology care.  To afford each patient quality time with our provider, please arrive at least 15 minutes before your scheduled appointment time.   Beginning January 23rd 2017 lab work for the [facility] will be done in the  Main lab at [facility] on 1st floor. If you have a lab appointment with the Cancer Center please come in thru the  Main Entrance and check in at the main information desk  You need to re-schedule your appointment should you arrive 10 or more minutes late.  We strive to give you quality time with our providers, and arriving late affects you and other patients whose appointments are after yours.  Also, if you no show three or more times for appointments you may be dismissed from the clinic at the providers discretion.     Again, thank you for choosing [facility].  Our hope is that these requests will decrease the amount of time that you wait before being seen by our physicians.       _____________________________________________________________  Should you have questions after your visit to [facility], please contact our office at [phone] between the hours of 8:30 a.m. and 4:30 p.m.  Voicemails left after 4:30 p.m. will not be returned until the following business day.  For prescription refill requests, have your pharmacy contact our office.         Resources For Cancer Patients and their Caregivers ? American Cancer Society: Can assist  with transportation, wigs, general needs, runs Look Good Feel Better.        [phone] ? Cancer Care: Provides financial assistance, online support groups, medication/co-pay assistance.  1-800-813-HOPE 226-546-3082(4673) ? [facility] Assists [place] cancer patients and their families through emotional , educational and financial support.  [phone] ? [place] DSS Where to apply for food stamps, Medicaid and utility assistance. [phone] ? RCATS: Transportation to medical appointments. 7240772473([phone] ? Social Security Administration: May apply for disability if have a Stage IV cancer. [phone] ? [place], Disability and Transit Services: Assists with nutrition, care and transit needs. [phone]  Cancer Center Support Programs: @10RELATIVEDAYS @ > Cancer Support Group  2nd Tuesday of the month 1pm-2pm, Journey Room  > Creative Journey  3rd Tuesday of the month 1130am-1pm, Journey Room  > Look Good Feel Better  1st Wednesday of the month 10am-12 noon, Journey Room (Call American Cancer Society to register [phone])

## 2015-10-10 ENCOUNTER — Encounter (HOSPITAL_COMMUNITY): Payer: Medicare Other

## 2015-10-10 DIAGNOSIS — D591 Other autoimmune hemolytic anemias: Secondary | ICD-10-CM | POA: Diagnosis not present

## 2015-10-10 DIAGNOSIS — D5919 Other autoimmune hemolytic anemia: Secondary | ICD-10-CM

## 2015-10-10 LAB — CBC WITH DIFFERENTIAL/PLATELET
BASOS PCT: 1 %
Basophils Absolute: 0.1 10*3/uL (ref 0.0–0.1)
Eosinophils Absolute: 0.3 10*3/uL (ref 0.0–0.7)
Eosinophils Relative: 4 %
HEMATOCRIT: 40.2 % (ref 39.0–52.0)
Hemoglobin: 13.5 g/dL (ref 13.0–17.0)
LYMPHS ABS: 1.2 10*3/uL (ref 0.7–4.0)
Lymphocytes Relative: 17 %
MCH: 33 pg (ref 26.0–34.0)
MCHC: 33.6 g/dL (ref 30.0–36.0)
MCV: 98.3 fL (ref 78.0–100.0)
MONO ABS: 0.7 10*3/uL (ref 0.1–1.0)
MONOS PCT: 10 %
NEUTROS ABS: 4.7 10*3/uL (ref 1.7–7.7)
Neutrophils Relative %: 68 %
Platelets: 200 10*3/uL (ref 150–400)
RBC: 4.09 MIL/uL — ABNORMAL LOW (ref 4.22–5.81)
RDW: 12.8 % (ref 11.5–15.5)
WBC: 7 10*3/uL (ref 4.0–10.5)

## 2015-10-10 LAB — COMPREHENSIVE METABOLIC PANEL
ALK PHOS: 64 U/L (ref 38–126)
ALT: 32 U/L (ref 17–63)
AST: 26 U/L (ref 15–41)
Albumin: 3.9 g/dL (ref 3.5–5.0)
Anion gap: 6 (ref 5–15)
BILIRUBIN TOTAL: 1.7 mg/dL — AB (ref 0.3–1.2)
BUN: 22 mg/dL — AB (ref 6–20)
CO2: 28 mmol/L (ref 22–32)
CREATININE: 0.94 mg/dL (ref 0.61–1.24)
Calcium: 8.9 mg/dL (ref 8.9–10.3)
Chloride: 105 mmol/L (ref 101–111)
GFR calc Af Amer: >60 mL/min (ref >60)
GLUCOSE: 116 mg/dL — AB (ref 65–99)
Potassium: 4.1 mmol/L (ref 3.5–5.1)
Sodium: 139 mmol/L (ref 135–145)
TOTAL PROTEIN: 6.4 g/dL — AB (ref 6.5–8.1)

## 2015-10-10 LAB — LACTATE DEHYDROGENASE: LDH: 188 U/L (ref 98–192)

## 2015-10-10 LAB — RETICULOCYTES
RBC.: 4.09 MIL/uL — ABNORMAL LOW (ref 4.22–5.81)
Retic Count, Absolute: 208.6 10*3/uL — ABNORMAL HIGH (ref 19.0–186.0)
Retic Ct Pct: 5.1 % — ABNORMAL HIGH (ref 0.4–3.1)

## 2015-10-16 NOTE — Progress Notes (Signed)
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   HISTORY OF PRESENTING ILLNESS:  [name] Page is a wonderful 70 y.o. 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 She has been on B12 shots monthly and takes folic acid orally daily for the pernicious anemia. Patient initially diagnosed was treated with 100 mg of prednisone daily which she reports was stable over 1 year.  His care was taken over by Dr. Mariel Sleet in 2015 when he was noted to be relapsing and he received Rituxan weekly for 4 doses in May 2015.  He appears to have had some delayed response to Rituxan and was in remission for about a year and a half not requiring any additional treatments during that period.  Patient had a PET CT scan on 12/01/2014 which showed no hypermetabolic lymphadenopathy. Stable mild splenomegaly without splenic hypermetabolism. Noted to have some patchy consolidation and groundglass opacity in the basilar right upper lobe and a new left upper lobe lung nodule with mild FDG uptake -thought to be inflammatory.  Patient had a CT of the chest in Jan 2017 for follow-up and a left upper lobe nodule apparently not noted.  However the smaller 7 mm right  lower lobe nodule was noted which was present in 2014. Might need repeat CT scan in about a year.  Patient was last seen by Dr. Laurie Panda on 08/08/2015 and was noted to have a hemoglobin of 11.2 with an MCV of 109.6 normal WBC count of 5.8k normal platelet count of 198k. His reticulocyte count was 11.9% and LDH was 350 and he was noted to be having another relapse of his warm autoantibody hemolytic anemia. (Baseline hemoglobin was noted to be 14 and previous reticulocyte count was 4%)  He was started back on prednisone 60 mg by mouth daily and has been tapering this gradually over the last month and is down to 10 mg every other day.  Labs done on 08/28/2015 showed improvement in his hemoglobin to 12.9 with an MCV of 101. Bilirubin level still somewhat elevated at 2.4. LDH was down to 210. Reticulocyte count 5.9% haptoglobin of less than 10.        MEDICAL HISTORY:  Past Medical History:  Diagnosis Date  . Coronary atherosclerosis of native coronary artery    Multivessel, LVEF 59%  . Essential hypertension, benign   . Hemolytic anemia (HCC)   . Hyperlipidemia   . Pernicious anemia   Warm antibody autoimmune hemolytic anemia. Pernicious anemia Steroid-induced hyperglycemia History of mild splenomegaly on PET/CT scan History of lung nodule on PET/CT in October 2016 which is being monitored. Repeat CT of the chest in January 2017 apparently did not show the nodule. Cholelithiasis Loraine Leriche sigmoid diverticulosis Coronary artery disease status post CABG Stable ectasia of infrarenal abdominal aorta Bilateral small fat-containing inguinal hernias  SURGICAL HISTORY: Past Surgical History:  Procedure Laterality Date  . CORONARY ARTERY BYPASS GRAFT  2004   LIMA to LAD, SVG to OM, SVG to PDA    SOCIAL HISTORY: Social History   Social History  . Marital status: Married    Spouse name: N/A  . Number of children: N/A  . Years of education: N/A   Occupational History  . Full time     Social History Main Topics  . Smoking status: Former Smoker    Types: Cigarettes    Quit date: 02/11/2002  . Smokeless tobacco: Never Used  . Alcohol use No  . Drug use: No  . Sexual activity: Not on file   Other Topics Concern  . Not on file   Social History Narrative  . No narrative on file    FAMILY HISTORY: Family History  Problem Relation Age of Onset  . Dementia Mother   . Cancer Mother     colon  . COPD Father   . Ulcerative colitis Father   . Heart attack Father     ALLERGIES:  has No Known Allergies.  MEDICATIONS:  Current Outpatient Prescriptions  Medication Sig Dispense Refill  . aspirin EC 81 MG tablet Take 81 mg by mouth daily.    . Aspirin-Acetaminophen-Caffeine (GOODYS EXTRA STRENGTH) [phone] MG PACK Take by mouth.    Marland Kitchen b complex vitamins tablet Take 1 tablet by mouth daily.    . calcium carbonate (OS-CAL) 600 MG TABS tablet Take 600 mg by mouth daily with breakfast.    . calcium citrate-vitamin D (CITRACAL+D) 315-200 MG-UNIT tablet Take by mouth.    . Cranberry 500 MG CAPS Take by mouth.    . Fish Oil-Cholecalciferol (FISH OIL + D3 PO) Take 1 capsule by mouth daily.    . Flaxseed, Linseed, (FLAX SEED OIL PO) Take 1 capsule by mouth daily.    . folic acid (FOLVITE) 1 MG tablet Take 1 mg by mouth daily.    Marland Kitchen GARLIC OIL PO Take 1 capsule by mouth daily.    Marland Kitchen glipiZIDE (GLUCOTROL) 5 MG tablet Take 5 mg by mouth 2 (two) times daily before a meal.    . insulin aspart (NOVOLOG) 100 UNIT/ML injection Inject 0-6 Units into the skin 3 (three) times daily before meals.    Marland Kitchen levothyroxine (SYNTHROID, LEVOTHROID) 50 MCG tablet Take 50 mcg by mouth daily.    . Magnesium 250 MG TABS Take 1 tablet by mouth daily.    . metoprolol succinate (TOPROL-XL) 25 MG 24 hr tablet Take 25 mg by mouth daily.      . Misc Natural Products (PROSTATE CONTROL PO) Take 1 tablet by mouth daily.    . Multiple Vitamin (MULTIVITAMIN) tablet Take 1 tablet by mouth daily.    . Multiple  Vitamins-Minerals (MULTI FOR HIM 50+) TABS Take by mouth.    Ailene Ards 3-6-9 CAPS Take by mouth.    . Red Yeast Rice 600 MG TABS Take by mouth.    . Saw Palmetto 160 MG CAPS Take by mouth.    . vitamin B-12 (CYANOCOBALAMIN) 1000 MCG tablet Take 1,000 mcg by mouth daily.    . metFORMIN (GLUCOPHAGE) 500 MG tablet Take 500 mg by mouth daily with breakfast.    . predniSONE (DELTASONE) 10 MG tablet 10mg  po every other day or as directed 100 tablet 0  . Zn-Pyg Afri-Nettle-Saw Palmet (SAW PALMETTO COMPLEX PO) Take 2 capsules by mouth 2 (two) times daily.     No current facility-administered medications for this visit.  REVIEW OF SYSTEMS:    10 Point review of Systems was done is negative except as noted above.  PHYSICAL EXAMINATION: ECOG PERFORMANCE STATUS: 1 - Symptomatic but completely ambulatory  . Vitals:   09/01/15 1300  BP: 128/70  Pulse: 79  Resp: 16  Temp: 97.6 F (36.4 C)   Filed Weights   09/01/15 1300  Weight: 215 lb 4.8 oz (97.7 kg)   .Body mass index is 28.41 kg/m.  GENERAL:alert, in no acute distress and comfortable SKIN: skin color, texture, turgor are normal, no rashes or significant lesions EYES: normal, conjunctiva are pink and non-injected, sclera clear OROPHARYNX:no exudate, no erythema and lips, buccal mucosa, and tongue normal  NECK: supple, no JVD, thyroid normal size, non-tender, without nodularity LYMPH:  no palpable lymphadenopathy in the cervical, axillary or inguinal LUNGS: clear to auscultation with normal respiratory effort HEART: regular rate & rhythm,  no murmurs and no lower extremity edema ABDOMEN: abdomen soft, non-tender, normoactive bowel sounds  Musculoskeletal: no cyanosis of digits and no clubbing  PSYCH: alert & oriented x 3 with fluent speech NEURO: no focal motor/sensory deficits  LABORATORY DATA:  I have reviewed the data as listed  Component     Latest Ref Rng & Units 08/28/2015  WBC     4.0 - 10.5 K/uL 7.6  RBC     4.22 -  5.81 MIL/uL 3.81 (L)  Hemoglobin     13.0 - 17.0 g/dL 12.9 (L)  HCT     39.0 - 52.0 % 38.5 (L)  MCV     78.0 - 100.0 fL 101.0 (H)  MCH     26.0 - 34.0 pg 33.9  MCHC     30.0 - 36.0 g/dL 33.5  RDW     11.5 - 15.5 % 12.8  Platelets     150 - 400 K/uL 158  Neutrophils     % 72  NEUT#     1.7 - 7.7 K/uL 5.5  Lymphocytes     % 18  Lymphocyte #     0.7 - 4.0 K/uL 1.3  Monocytes Relative     % 8  Monocyte #     0.1 - 1.0 K/uL 0.6  Eosinophil     % 2  Eosinophils Absolute     0.0 - 0.7 K/uL 0.2  Basophil     % 0  Basophils Absolute     0.0 - 0.1 K/uL 0.0  Sodium     135 - 145 mmol/L 137  Potassium     3.5 - 5.1 mmol/L 4.2  Chloride     101 - 111 mmol/L 102  CO2     22 - 32 mmol/L 29  Glucose     65 - 99 mg/dL 177 (H)  BUN     6 - 20 mg/dL 23 (H)  Creatinine     0.61 - 1.24 mg/dL 1.01  Calcium     8.9 - 10.3 mg/dL 8.5 (L)  Total Protein     6.5 - 8.1 g/dL 6.1 (L)  Albumin     3.5 - 5.0 g/dL 3.8  AST     15 - 41 U/L 24  ALT     17 - 63 U/L 42  Alkaline Phosphatase     38 - 126 U/L 62  Total Bilirubin     0.3 - 1.2 mg/dL 2.4 (H)  EGFR (Non-African Amer.)     >60 mL/min >60  EGFR (African American)     >60  mL/min >60  Anion gap     5 - 15 6  Retic Ct Pct     0.4 - 3.1 % 5.9 (H)  RBC.     4.22 - 5.81 MIL/uL 3.81 (L)  Retic Count, Manual     19.0 - 186.0 K/uL 224.8 (H)  LDH     98 - 192 U/L 210 (H)  Haptoglobin     34 - 200 mg/dL <10 (L)    RADIOGRAPHIC STUDIES: I have personally reviewed the radiological images as listed and agreed with the findings in the report. No results found.  ASSESSMENT & PLAN:   70 year old Caucasian male with   #1 Warm autoantibody autoimmune hemolytic anemia was diagnosed in August 2014. No evidence of overt associated solid tumor, a lymphoproliferative disorder or other primary autoimmune condition. Patient has previously been treated with high-dose prednisone with a taper or a year in 2014-2015 Receive Rituxan  weekly 4 doses in May 2015 and appears to have possibly had some delay benefits and was in remission for a year and a half. Noted to have relapsed in June 2017 and is back on a prednisone taper. His baseline hemoglobin appears to be about 14 with a reticulocyte count of about 4%. #2 pernicious anemia with anti-intrinsic factor antibody Plan -Patient is currently on prednisone 10 mg every other day. He has no acute issues at this time and has no significant fatigue. -His hemoglobin is up from 11.2 now up to 12.9. Reticulocyte count is down from 11% to just above 5%. LDH has improved from 350 down to 210.  -He has had issues with steroid-related hyperglycemia/DM2 and is currently on metformin and glipizide trying to maintain control. -We discussed the current status of his blood counts. -We discussed that he should take folic acid at least 2 mg by mouth daily and vitamin B complex 1 tablet by mouth daily to support accelerated hematopoiesis.  -He is currently on monthly B12 subcutaneous injections - if levels are okay might consider switching to sublingual preparation with close monitoring. -Would maintain current dose of prednisone without additional taper at this time and monitor his labs every 2 weeks. -New prescription for prednisone was sent to his pharmacy. -Return to care with Dr. Whitney Muse in 4 weeks with repeat labs. -If further taper prednisone is not possible due to ongoing hemolysis might need to consider repeating Rituxan every weekly 4 doses. -Alternatively might need to consider the risks versus benefit of splenectomy. Patient reports that he has had vaccines in preparation for splenectomy though this needs to be confirmed with his outside records. -He was counseled to call us immediately with increasing shortness of breath chest pain or dizziness or overt hemoglobinuria.  #3 lung nodule -Might need repeat CT scan in 6 months  #4 mild splenomegaly -no hypermetabolic on PET/CT scan  from October 2016. 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 Labs q2weeks   All of the patients questions were answered with apparent satisfaction. The patient knows to call the clinic with any problems, questions or concerns.  I spent 50 minutes counseling the patient face to face. The total time spent in the appointment was 60 minutes and more than 50% was on counseling and direct patient cares.    Sullivan Lone MD Nixa AAHIVMS [facility] Hematology/Oncology Physician [facility]  (Office):       [phone] (Work cell):  [phone] (Fax):  [phone]

## 2015-10-18 ENCOUNTER — Encounter (HOSPITAL_COMMUNITY): Payer: Self-pay | Admitting: Oncology

## 2015-10-18 ENCOUNTER — Encounter (HOSPITAL_COMMUNITY): Payer: Medicare Other | Attending: Oncology | Admitting: Oncology

## 2015-10-18 ENCOUNTER — Encounter (HOSPITAL_COMMUNITY): Payer: Medicare Other

## 2015-10-18 VITALS — BP 119/62 | HR 73 | Temp 97.4°F | Resp 16 | Ht 73.0 in | Wt 221.6 lb

## 2015-10-18 DIAGNOSIS — D5919 Other autoimmune hemolytic anemia: Secondary | ICD-10-CM

## 2015-10-18 DIAGNOSIS — D510 Vitamin B12 deficiency anemia due to intrinsic factor deficiency: Secondary | ICD-10-CM | POA: Diagnosis not present

## 2015-10-18 DIAGNOSIS — D591 Autoimmune hemolytic anemia, unspecified: Secondary | ICD-10-CM | POA: Insufficient documentation

## 2015-10-18 DIAGNOSIS — D5911 Warm autoimmune hemolytic anemia: Secondary | ICD-10-CM

## 2015-10-18 LAB — COMPREHENSIVE METABOLIC PANEL
ALBUMIN: 4.1 g/dL (ref 3.5–5.0)
ALT: 33 U/L (ref 17–63)
ANION GAP: 5 (ref 5–15)
AST: 28 U/L (ref 15–41)
Alkaline Phosphatase: 62 U/L (ref 38–126)
BUN: 18 mg/dL (ref 6–20)
CHLORIDE: 104 mmol/L (ref 101–111)
CO2: 29 mmol/L (ref 22–32)
Calcium: 9.3 mg/dL (ref 8.9–10.3)
Creatinine, Ser: 0.98 mg/dL (ref 0.61–1.24)
GFR calc Af Amer: >60 mL/min (ref >60)
GFR calc non Af Amer: >60 mL/min (ref >60)
GLUCOSE: 114 mg/dL — AB (ref 65–99)
POTASSIUM: 4.7 mmol/L (ref 3.5–5.1)
SODIUM: 138 mmol/L (ref 135–145)
Total Bilirubin: 1.3 mg/dL — ABNORMAL HIGH (ref 0.3–1.2)
Total Protein: 6.7 g/dL (ref 6.5–8.1)

## 2015-10-18 LAB — CBC WITH DIFFERENTIAL/PLATELET
BASOS PCT: 1 %
Basophils Absolute: 0 10*3/uL (ref 0.0–0.1)
EOS ABS: 0 10*3/uL (ref 0.0–0.7)
Eosinophils Relative: 0 %
HEMATOCRIT: 41 % (ref 39.0–52.0)
HEMOGLOBIN: 13.8 g/dL (ref 13.0–17.0)
LYMPHS ABS: 0.7 10*3/uL (ref 0.7–4.0)
Lymphocytes Relative: 9 %
MCH: 32.2 pg (ref 26.0–34.0)
MCHC: 33.7 g/dL (ref 30.0–36.0)
MCV: 95.8 fL (ref 78.0–100.0)
MONOS PCT: 4 %
Monocytes Absolute: 0.3 10*3/uL (ref 0.1–1.0)
NEUTROS ABS: 7.3 10*3/uL (ref 1.7–7.7)
NEUTROS PCT: 86 %
Platelets: 205 10*3/uL (ref 150–400)
RBC: 4.28 MIL/uL (ref 4.22–5.81)
RDW: 12.6 % (ref 11.5–15.5)
WBC: 8.4 10*3/uL (ref 4.0–10.5)

## 2015-10-18 LAB — RETICULOCYTES
RBC.: 4.28 MIL/uL (ref 4.22–5.81)
RETIC CT PCT: 4.1 % — AB (ref 0.4–3.1)
Retic Count, Absolute: 175.5 10*3/uL (ref 19.0–186.0)

## 2015-10-18 LAB — LACTATE DEHYDROGENASE: LDH: 207 U/L — ABNORMAL HIGH (ref 98–192)

## 2015-10-18 NOTE — Patient Instructions (Signed)
[facility] at [facility] Discharge Instructions  RECOMMENDATIONS MADE BY THE CONSULTANT AND ANY TEST RESULTS WILL BE SENT TO YOUR REFERRING PHYSICIAN.  You were seen today by Jenita Seashoreom Kefalas PA-C. Continue taking the 10mg  Prednisone every other day. Continue taking the B12 as well.  Labs weekly for 3-4 weeks. Follow up with Dr. Galen ManilaPenland in 3-4 weeks  Thank you for choosing  Cancer Center at [facility] to provide your oncology and hematology care.  To afford each patient quality time with our provider, please arrive at least 15 minutes before your scheduled appointment time.   Beginning January 23rd 2017 lab work for the [facility] will be done in the  Main lab at [facility] on 1st floor. If you have a lab appointment with the Cancer Center please come in thru the  Main Entrance and check in at the main information desk  You need to re-schedule your appointment should you arrive 10 or more minutes late.  We strive to give you quality time with our providers, and arriving late affects you and other patients whose appointments are after yours.  Also, if you no show three or more times for appointments you may be dismissed from the clinic at the providers discretion.     Again, thank you for choosing [facility].  Our hope is that these requests will decrease the amount of time that you wait before being seen by our physicians.       _____________________________________________________________  Should you have questions after your visit to [facility], please contact our office at [phone] between the hours of 8:30 a.m. and 4:30 p.m.  Voicemails left after 4:30 p.m. will not be returned until the following business day.  For prescription refill requests, have your pharmacy contact our office.         Resources For Cancer Patients and their Caregivers ? American Cancer Society: Can assist with transportation, wigs,  general needs, runs Look Good Feel Better.        [phone] ? Cancer Care: Provides financial assistance, online support groups, medication/co-pay assistance.  1-800-813-HOPE (573)[phone](4673) ? [facility] Assists [place] cancer patients and their families through emotional , educational and financial support.  [phone] ? [place] DSS Where to apply for food stamps, Medicaid and utility assistance. [phone] ? [employer]: Transportation to medical appointments. [phone] ? Social Security Administration: May apply for disability if have a Stage IV cancer. [phone] ? [place] A[place], Disability and Transit Services: Assists with nutrition, care and transit needs. [phone]  Cancer Center Support Programs: @10RELATIVEDAYS @ > Cancer Support Group  2nd Tuesday of the month 1pm-2pm, Journey Room  > Creative Journey  3rd Tuesday of the month 1130am-1pm, Journey Room  > Look Good Feel Better  1st Wednesday of the month 10am-12 noon, Journey Room (Call American Cancer Society to register [phone])

## 2015-10-18 NOTE — Assessment & Plan Note (Addendum)
autoimmune hemolytic anemia, IgG warm reactive with history of positive Coombs 09/2012 and mild C3 crossover.  Responsive to prednisone and treated with Rituxan x 4 07/2013.  [facility] Dr. Elta GuadeloupeJoseph Moore (Pager [phone]).  Labs today: CBC diff, CMET, LDH, Retic count.  I personally reviewed and went over laboratory results with the patient.  The results are noted within this dictation.  Blood counts are within normal limits.  Retic count is stable and improved compared to previous values.  For now, he will continue with Prednisone 10 mg QOD.  Goal is to decrease Prednisone to 5 mg QOD and eventually discontinue this medication at the patient's request.  HOWEVER, he is advised to continue with Prednisone 10 mg QOD for the time being while we await lab work that is pending at this time and retic count remains mildly elevated.  Labs weekly: CBC diff, CMET, LDH, Retic count x 3-4  Return in 3-4 weeks for follow-up.  In the interim, he will be provided directions regarding his Prednisone dosing when further taper is reasonable (based upon lab results).

## 2015-10-18 NOTE — Progress Notes (Signed)
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  INTERVAL HISTORY: [name] Page 70 y.o. 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  Responsive to prednisone and treated with Rituxan x 4 07/2013.  He has seen hematology at [facility], Dr. Elta Guadeloupe (Pager [phone]).  Chart is reviewed.  He denies any complaints today.  He reports compliance with his Prednisone QOD.  He expresses his desire to eventually taper off Prednisone.  He is well versed in splenectomy in the future if needed and he notes that he has been vaccinated.  He is not enthused about this possibility.  Thankfully, for now, it is not indicated.  He notes that he is taking SL B12.  He has not had a B12 shot in > 1 month.  Review of Systems  Constitutional: Negative.  Negative for chills, fever, malaise/fatigue and weight loss.  HENT: Negative.   Eyes: Negative.   Respiratory: Negative.  Negative for cough.   Cardiovascular: Negative.  Negative for chest pain.  Gastrointestinal: Negative.   Genitourinary: Negative.   Musculoskeletal: Negative.   Skin: Negative.  Negative for rash.  Neurological: Negative.  Negative for weakness.  Endo/Heme/Allergies: Negative.   Psychiatric/Behavioral: Negative.     Past Medical History:  Diagnosis Date  . Coronary atherosclerosis of native coronary artery    Multivessel, LVEF 59%  . Essential hypertension, benign   . Hemolytic anemia (HCC)   . 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 Hyperlipidemia   . Pernicious anemia     Past Surgical History:  Procedure Laterality Date  . CORONARY ARTERY  BYPASS GRAFT  2004   LIMA to LAD, SVG to OM, SVG to PDA    Family History  Problem Relation Age of Onset  . Dementia Mother   . Cancer Mother     colon  . COPD Father   . Ulcerative colitis Father   . Heart attack Father     Social History   Social History  . Marital status: Married    Spouse name: N/A  . Number of children: N/A  . Years of education: N/A   Occupational History  . Full time    Social History Main Topics  . Smoking status: Former Smoker    Types: Cigarettes    Quit date: 02/11/2002  . Smokeless tobacco: Never Used  . Alcohol use No  . Drug use: No  . Sexual activity: Not Asked   Other Topics Concern  . None   Social History Narrative  . None     PHYSICAL EXAMINATION  ECOG PERFORMANCE STATUS: 0 - Asymptomatic  Vitals:   10/18/15 1401  BP: 119/62  Pulse: 73  Resp: 16  Temp: 97.4 F (36.3 C)    GENERAL:alert, no distress, well nourished, well developed, comfortable, cooperative, smiling and unaccompanied SKIN: skin color, texture, turgor are normal, no rashes or significant lesions HEAD: Normocephalic, No masses, lesions, tenderness or abnormalities EYES: normal, EOMI, Conjunctiva are pink and non-injected EARS: External ears normal OROPHARYNX:lips, buccal mucosa, and tongue normal and mucous membranes are moist  NECK: supple, trachea midline LYMPH:  not examined BREAST:not examined LUNGS: clear to auscultation  HEART: regular rate & rhythm ABDOMEN:abdomen soft, non-tender and normal bowel sounds BACK: Back symmetric, no curvature. EXTREMITIES:less then 2 second capillary refill, no joint deformities, effusion, or inflammation, no skin discoloration, no clubbing, no cyanosis  NEURO: alert & oriented x 3 with fluent speech, no focal motor/sensory deficits, gait normal   LABORATORY DATA: CBC    Component Value Date/Time   WBC 8.4 10/18/2015 1337   RBC 4.28 10/18/2015 1337   RBC 4.28 10/18/2015 1337   HGB 13.8 10/18/2015 1337    HCT 41.0 10/18/2015 1337   PLT 205 10/18/2015 1337   MCV 95.8 10/18/2015 1337   MCH 32.2 10/18/2015 1337   MCHC 33.7 10/18/2015 1337   RDW 12.6 10/18/2015 1337   LYMPHSABS 0.7 10/18/2015 1337   MONOABS 0.3 10/18/2015 1337   EOSABS 0.0 10/18/2015 1337   BASOSABS 0.0 10/18/2015 1337      Chemistry      Component Value Date/Time   NA 138 10/18/2015 1337   K 4.7 10/18/2015 1337   CL 104 10/18/2015 1337   CO2 29 10/18/2015 1337   BUN 18 10/18/2015 1337   CREATININE 0.98 10/18/2015 1337      Component Value Date/Time   CALCIUM 9.3 10/18/2015 1337   ALKPHOS 62 10/18/2015 1337   AST 28 10/18/2015 1337   ALT 33 10/18/2015 1337   BILITOT 1.3 (H) 10/18/2015 1337        PENDING LABS:   RADIOGRAPHIC STUDIES:  No results found.   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  Responsive to prednisone and treated with Rituxan x 4 07/2013.  He has [facility] hematology at [facility], Dr. Elta GuadeloupeJoseph Moore (Pager [phone]).  Labs today: CBC diff, CMET, LDH, Retic count.  I personally reviewed and went over laboratory results with the patient.  The results are noted within this dictation.  Blood counts are within normal limits.  Retic count is stable and improved compared to previous values.  For now, he will continue with Prednisone 10 mg QOD.  Goal is to decrease Prednisone to 5 mg QOD and eventually discontinue this medication at the patient's request.  HOWEVER, he is advised to continue with Prednisone 10 mg QOD for the time being while we await lab work that is pending at this time and retic count remains mildly elevated.  Labs weekly: CBC diff, CMET, LDH, Retic count x 3-4  Return in 3-4 weeks for follow-up.  In the interim, he will be provided directions regarding his Prednisone dosing when further taper is reasonable (based upon lab  results).    Pernicious anemia Pernicious anemia, previously on B12 monthly in addition to SL B12.  Given his documented B12 level, it is not unreasnoable to hold IM B12 replacement and continue a trial of SL B12 alone which he is doing.  Some patients with pernicious anemia can be maintained on sublingual therapy without intramuscular therapy.   B12 level in 2 weeks and 4 weeks.   ORDERS PLACED FOR THIS ENCOUNTER: Orders Placed This Encounter  Procedures  . CBC with Differential  . Comprehensive metabolic panel  . Lactate dehydrogenase  . Reticulocytes  . Vitamin B12    MEDICATIONS PRESCRIBED THIS ENCOUNTER: Meds ordered this encounter  Medications  . psyllium (METAMUCIL) 58.6 % powder    Sig: Take 1 packet by mouth 3 (three) times daily.    THERAPY  PLAN:  Continue Prednisone 10 mg QOD with goal of decreasing to 5 mg QOD in future based upon lab results.  All questions were answered. The patient knows to call the clinic with any problems, questions or concerns. We can certainly see the patient much sooner if necessary.  Patient and plan discussed with Dr. Loma Messing and she is in agreement with the aforementioned.   This note is electronically signed by: Tina Griffiths 10/18/2015 9:22 PM

## 2015-10-18 NOTE — Assessment & Plan Note (Signed)
Pernicious anemia, previously on B12 monthly in addition to SL B12.  Given his documented B12 level, it is not unreasnoable to hold IM B12 replacement and continue a trial of SL B12 alone which he is doing.  Some patients with pernicious anemia can be maintained on sublingual therapy without intramuscular therapy.   B12 level in 2 weeks and 4 weeks.

## 2015-10-19 ENCOUNTER — Telehealth (HOSPITAL_COMMUNITY): Payer: Self-pay | Admitting: *Deleted

## 2015-10-19 NOTE — Telephone Encounter (Signed)
-----   Message from Thomas S Kefalas, PA-C sent at 10/18/2015  5:18 PM EDT ----- Bilirubin is better.  LDH is stable.  Recommend continued Prednisone 10 mg QOD as discussed in office visit. 

## 2015-10-19 NOTE — Telephone Encounter (Signed)
-----   Message from Ellouise Newerhomas S Kefalas, PA-C sent at 10/18/2015  5:18 PM EDT ----- Bilirubin is better.  LDH is stable.  Recommend continued Prednisone 10 mg QOD as discussed in office visit.

## 2015-10-19 NOTE — Telephone Encounter (Signed)
Pt aware of labs and medication

## 2015-10-25 ENCOUNTER — Encounter (HOSPITAL_COMMUNITY): Payer: Medicare Other

## 2015-10-25 DIAGNOSIS — D5911 Warm autoimmune hemolytic anemia: Secondary | ICD-10-CM

## 2015-10-25 DIAGNOSIS — D591 Other autoimmune hemolytic anemias: Secondary | ICD-10-CM | POA: Diagnosis not present

## 2015-10-25 LAB — CBC WITH DIFFERENTIAL/PLATELET
BASOS ABS: 0 10*3/uL (ref 0.0–0.1)
BASOS PCT: 0 %
EOS ABS: 0 10*3/uL (ref 0.0–0.7)
Eosinophils Relative: 0 %
HCT: 40.8 % (ref 39.0–52.0)
HEMOGLOBIN: 13.6 g/dL (ref 13.0–17.0)
Lymphocytes Relative: 9 %
Lymphs Abs: 0.8 10*3/uL (ref 0.7–4.0)
MCH: 32.2 pg (ref 26.0–34.0)
MCHC: 33.3 g/dL (ref 30.0–36.0)
MCV: 96.5 fL (ref 78.0–100.0)
MONOS PCT: 3 %
Monocytes Absolute: 0.3 10*3/uL (ref 0.1–1.0)
NEUTROS PCT: 88 %
Neutro Abs: 7.7 10*3/uL (ref 1.7–7.7)
Platelets: 218 10*3/uL (ref 150–400)
RBC: 4.23 MIL/uL (ref 4.22–5.81)
RDW: 12.8 % (ref 11.5–15.5)
WBC: 8.8 10*3/uL (ref 4.0–10.5)

## 2015-10-25 LAB — COMPREHENSIVE METABOLIC PANEL
ALBUMIN: 4.1 g/dL (ref 3.5–5.0)
ALK PHOS: 62 U/L (ref 38–126)
ALT: 32 U/L (ref 17–63)
ANION GAP: 6 (ref 5–15)
AST: 27 U/L (ref 15–41)
BUN: 21 mg/dL — ABNORMAL HIGH (ref 6–20)
CALCIUM: 9.1 mg/dL (ref 8.9–10.3)
CO2: 29 mmol/L (ref 22–32)
Chloride: 102 mmol/L (ref 101–111)
Creatinine, Ser: 1.08 mg/dL (ref 0.61–1.24)
GFR calc Af Amer: >60 mL/min (ref >60)
GFR calc non Af Amer: >60 mL/min (ref >60)
GLUCOSE: 163 mg/dL — AB (ref 65–99)
Potassium: 4.9 mmol/L (ref 3.5–5.1)
SODIUM: 137 mmol/L (ref 135–145)
Total Bilirubin: 1.3 mg/dL — ABNORMAL HIGH (ref 0.3–1.2)
Total Protein: 6.7 g/dL (ref 6.5–8.1)

## 2015-10-25 LAB — RETICULOCYTES
RBC.: 4.23 MIL/uL (ref 4.22–5.81)
RETIC COUNT ABSOLUTE: 203 10*3/uL — AB (ref 19.0–186.0)
Retic Ct Pct: 4.8 % — ABNORMAL HIGH (ref 0.4–3.1)

## 2015-10-25 LAB — LACTATE DEHYDROGENASE: LDH: 232 U/L — ABNORMAL HIGH (ref 98–192)

## 2015-11-01 ENCOUNTER — Encounter (HOSPITAL_COMMUNITY): Payer: Medicare Other

## 2015-11-01 DIAGNOSIS — D5911 Warm autoimmune hemolytic anemia: Secondary | ICD-10-CM

## 2015-11-01 DIAGNOSIS — D510 Vitamin B12 deficiency anemia due to intrinsic factor deficiency: Secondary | ICD-10-CM

## 2015-11-01 DIAGNOSIS — D591 Other autoimmune hemolytic anemias: Secondary | ICD-10-CM | POA: Diagnosis not present

## 2015-11-01 LAB — CBC WITH DIFFERENTIAL/PLATELET
Basophils Absolute: 0.1 10*3/uL (ref 0.0–0.1)
Basophils Relative: 1 %
EOS ABS: 0.2 10*3/uL (ref 0.0–0.7)
Eosinophils Relative: 3 %
HCT: 40.6 % (ref 39.0–52.0)
HEMOGLOBIN: 13.1 g/dL (ref 13.0–17.0)
LYMPHS ABS: 1.5 10*3/uL (ref 0.7–4.0)
LYMPHS PCT: 21 %
MCH: 31.6 pg (ref 26.0–34.0)
MCHC: 32.3 g/dL (ref 30.0–36.0)
MCV: 97.8 fL (ref 78.0–100.0)
MONOS PCT: 8 %
Monocytes Absolute: 0.6 10*3/uL (ref 0.1–1.0)
NEUTROS PCT: 67 %
Neutro Abs: 4.8 10*3/uL (ref 1.7–7.7)
Platelets: 202 10*3/uL (ref 150–400)
RBC: 4.15 MIL/uL — ABNORMAL LOW (ref 4.22–5.81)
RDW: 13.1 % (ref 11.5–15.5)
WBC: 7.1 10*3/uL (ref 4.0–10.5)

## 2015-11-01 LAB — COMPREHENSIVE METABOLIC PANEL
ALK PHOS: 61 U/L (ref 38–126)
ALT: 29 U/L (ref 17–63)
ANION GAP: 3 — AB (ref 5–15)
AST: 25 U/L (ref 15–41)
Albumin: 3.9 g/dL (ref 3.5–5.0)
BILIRUBIN TOTAL: 1.5 mg/dL — AB (ref 0.3–1.2)
BUN: 23 mg/dL — ABNORMAL HIGH (ref 6–20)
CALCIUM: 8.4 mg/dL — AB (ref 8.9–10.3)
CO2: 29 mmol/L (ref 22–32)
Chloride: 105 mmol/L (ref 101–111)
Creatinine, Ser: 1.16 mg/dL (ref 0.61–1.24)
Glucose, Bld: 136 mg/dL — ABNORMAL HIGH (ref 65–99)
Potassium: 4.2 mmol/L (ref 3.5–5.1)
SODIUM: 137 mmol/L (ref 135–145)
TOTAL PROTEIN: 6.5 g/dL (ref 6.5–8.1)

## 2015-11-01 LAB — VITAMIN B12: VITAMIN B 12: 2616 pg/mL — AB (ref 180–914)

## 2015-11-01 LAB — RETICULOCYTES
RBC.: 4.15 MIL/uL — ABNORMAL LOW (ref 4.22–5.81)
RETIC COUNT ABSOLUTE: 203.4 10*3/uL — AB (ref 19.0–186.0)
RETIC CT PCT: 4.9 % — AB (ref 0.4–3.1)

## 2015-11-01 LAB — LACTATE DEHYDROGENASE: LDH: 204 U/L — AB (ref 98–192)

## 2015-11-08 ENCOUNTER — Encounter (HOSPITAL_COMMUNITY): Payer: Medicare Other

## 2015-11-08 ENCOUNTER — Telehealth (HOSPITAL_COMMUNITY): Payer: Self-pay | Admitting: *Deleted

## 2015-11-08 DIAGNOSIS — D591 Other autoimmune hemolytic anemias: Principal | ICD-10-CM

## 2015-11-08 DIAGNOSIS — D5911 Warm autoimmune hemolytic anemia: Secondary | ICD-10-CM

## 2015-11-08 LAB — RETICULOCYTES
RBC.: 4.43 MIL/uL (ref 4.22–5.81)
Retic Count, Absolute: 217.1 10*3/uL — ABNORMAL HIGH (ref 19.0–186.0)
Retic Ct Pct: 4.9 % — ABNORMAL HIGH (ref 0.4–3.1)

## 2015-11-08 LAB — CBC WITH DIFFERENTIAL/PLATELET
Basophils Absolute: 0 10*3/uL (ref 0.0–0.1)
Basophils Relative: 1 %
EOS PCT: 0 %
Eosinophils Absolute: 0 10*3/uL (ref 0.0–0.7)
HCT: 42.7 % (ref 39.0–52.0)
Hemoglobin: 14.4 g/dL (ref 13.0–17.0)
LYMPHS ABS: 0.8 10*3/uL (ref 0.7–4.0)
LYMPHS PCT: 10 %
MCH: 32.5 pg (ref 26.0–34.0)
MCHC: 33.7 g/dL (ref 30.0–36.0)
MCV: 96.4 fL (ref 78.0–100.0)
MONO ABS: 0.4 10*3/uL (ref 0.1–1.0)
MONOS PCT: 5 %
Neutro Abs: 7 10*3/uL (ref 1.7–7.7)
Neutrophils Relative %: 84 %
PLATELETS: 211 10*3/uL (ref 150–400)
RBC: 4.43 MIL/uL (ref 4.22–5.81)
RDW: 13.1 % (ref 11.5–15.5)
WBC: 8.3 10*3/uL (ref 4.0–10.5)

## 2015-11-08 LAB — COMPREHENSIVE METABOLIC PANEL
ALT: 34 U/L (ref 17–63)
AST: 27 U/L (ref 15–41)
Albumin: 4.1 g/dL (ref 3.5–5.0)
Alkaline Phosphatase: 64 U/L (ref 38–126)
Anion gap: 6 (ref 5–15)
BUN: 18 mg/dL (ref 6–20)
CHLORIDE: 105 mmol/L (ref 101–111)
CO2: 25 mmol/L (ref 22–32)
CREATININE: 1.05 mg/dL (ref 0.61–1.24)
Calcium: 9.1 mg/dL (ref 8.9–10.3)
GFR calc Af Amer: >60 mL/min (ref >60)
GLUCOSE: 148 mg/dL — AB (ref 65–99)
Potassium: 4.5 mmol/L (ref 3.5–5.1)
Sodium: 136 mmol/L (ref 135–145)
Total Bilirubin: 1.6 mg/dL — ABNORMAL HIGH (ref 0.3–1.2)
Total Protein: 6.9 g/dL (ref 6.5–8.1)

## 2015-11-08 LAB — LACTATE DEHYDROGENASE: LDH: 206 U/L — AB (ref 98–192)

## 2015-11-08 NOTE — Telephone Encounter (Signed)
-----   Message from Thomas S Kefalas, PA-C sent at 11/08/2015  4:23 PM EDT ----- Stable.  Recommend ongoing Prednisone. 

## 2015-11-09 NOTE — Telephone Encounter (Signed)
-----   Message from Ellouise Newerhomas S Kefalas, PA-C sent at 11/08/2015  4:23 PM EDT ----- Stable.  Recommend ongoing Prednisone.

## 2015-11-09 NOTE — Telephone Encounter (Signed)
Pt aware that labs are stable and continue taking prednisone.

## 2015-11-15 ENCOUNTER — Encounter (HOSPITAL_COMMUNITY): Payer: Medicare Other | Attending: Hematology & Oncology | Admitting: Hematology & Oncology

## 2015-11-15 ENCOUNTER — Encounter (HOSPITAL_COMMUNITY): Payer: Medicare Other

## 2015-11-15 ENCOUNTER — Encounter (HOSPITAL_COMMUNITY): Payer: Self-pay | Admitting: Hematology & Oncology

## 2015-11-15 VITALS — BP 134/72 | HR 70 | Temp 98.0°F | Resp 16 | Wt 228.8 lb

## 2015-11-15 DIAGNOSIS — Z23 Encounter for immunization: Secondary | ICD-10-CM

## 2015-11-15 DIAGNOSIS — R161 Splenomegaly, not elsewhere classified: Secondary | ICD-10-CM

## 2015-11-15 DIAGNOSIS — Z0000 Encounter for general adult medical examination without abnormal findings: Secondary | ICD-10-CM

## 2015-11-15 DIAGNOSIS — D5911 Warm autoimmune hemolytic anemia: Secondary | ICD-10-CM

## 2015-11-15 DIAGNOSIS — D591 Other autoimmune hemolytic anemias: Principal | ICD-10-CM

## 2015-11-15 DIAGNOSIS — D510 Vitamin B12 deficiency anemia due to intrinsic factor deficiency: Secondary | ICD-10-CM | POA: Diagnosis not present

## 2015-11-15 LAB — COMPREHENSIVE METABOLIC PANEL
ALBUMIN: 3.9 g/dL (ref 3.5–5.0)
ALK PHOS: 57 U/L (ref 38–126)
ALT: 27 U/L (ref 17–63)
ANION GAP: 6 (ref 5–15)
AST: 25 U/L (ref 15–41)
BILIRUBIN TOTAL: 1.5 mg/dL — AB (ref 0.3–1.2)
BUN: 16 mg/dL (ref 6–20)
CALCIUM: 8.7 mg/dL — AB (ref 8.9–10.3)
CO2: 27 mmol/L (ref 22–32)
Chloride: 105 mmol/L (ref 101–111)
Creatinine, Ser: 0.95 mg/dL (ref 0.61–1.24)
GFR calc Af Amer: >60 mL/min (ref >60)
GFR calc non Af Amer: >60 mL/min (ref >60)
GLUCOSE: 114 mg/dL — AB (ref 65–99)
Potassium: 3.7 mmol/L (ref 3.5–5.1)
Sodium: 138 mmol/L (ref 135–145)
TOTAL PROTEIN: 6.5 g/dL (ref 6.5–8.1)

## 2015-11-15 LAB — CBC WITH DIFFERENTIAL/PLATELET
BASOS PCT: 1 %
Basophils Absolute: 0 10*3/uL (ref 0.0–0.1)
Eosinophils Absolute: 0.3 10*3/uL (ref 0.0–0.7)
Eosinophils Relative: 3 %
HEMATOCRIT: 39.9 % (ref 39.0–52.0)
HEMOGLOBIN: 13.4 g/dL (ref 13.0–17.0)
LYMPHS PCT: 18 %
Lymphs Abs: 1.4 10*3/uL (ref 0.7–4.0)
MCH: 32.1 pg (ref 26.0–34.0)
MCHC: 33.6 g/dL (ref 30.0–36.0)
MCV: 95.7 fL (ref 78.0–100.0)
MONOS PCT: 11 %
Monocytes Absolute: 0.8 10*3/uL (ref 0.1–1.0)
NEUTROS ABS: 5 10*3/uL (ref 1.7–7.7)
NEUTROS PCT: 67 %
Platelets: 194 10*3/uL (ref 150–400)
RBC: 4.17 MIL/uL — ABNORMAL LOW (ref 4.22–5.81)
RDW: 13.1 % (ref 11.5–15.5)
WBC: 7.5 10*3/uL (ref 4.0–10.5)

## 2015-11-15 LAB — LACTATE DEHYDROGENASE: LDH: 206 U/L — AB (ref 98–192)

## 2015-11-15 LAB — RETICULOCYTES
RBC.: 4.17 MIL/uL — AB (ref 4.22–5.81)
RETIC CT PCT: 4.5 % — AB (ref 0.4–3.1)
Retic Count, Absolute: 187.7 10*3/uL — ABNORMAL HIGH (ref 19.0–186.0)

## 2015-11-15 LAB — VITAMIN B12: Vitamin B-12: 2934 pg/mL — ABNORMAL HIGH (ref 180–914)

## 2015-11-15 MED ORDER — INFLUENZA VAC SPLIT QUAD 0.5 ML IM SUSY
0.5000 mL | PREFILLED_SYRINGE | Freq: Once | INTRAMUSCULAR | Status: AC
  Administered 2015-11-15: 0.5 mL via INTRAMUSCULAR
  Filled 2015-11-15: qty 0.5

## 2015-11-15 NOTE — Patient Instructions (Addendum)
[facility] at [facility] Discharge Instructions  RECOMMENDATIONS MADE BY THE CONSULTANT AND ANY TEST RESULTS WILL BE SENT TO YOUR REFERRING PHYSICIAN.  You saw Dr. Galen ManilaPenland today. Follow up in 6 weeks. Labs every 3 weeks.- no more weekly labs. You had flu shot today.  Thank you for choosing [facility] at [facility] to provide your oncology and hematology care.  To afford each patient quality time with our provider, please arrive at least 15 minutes before your scheduled appointment time.   Beginning January 23rd 2017 lab work for the [facility] will be done in the  Main lab at [facility] on 1st floor. If you have a lab appointment with the Cancer Center please come in thru the  Main Entrance and check in at the main information desk  You need to re-schedule your appointment should you arrive 10 or more minutes late.  We strive to give you quality time with our providers, and arriving late affects you and other patients whose appointments are after yours.  Also, if you no show three or more times for appointments you may be dismissed from the clinic at the providers discretion.     Again, thank you for choosing [facility].  Our hope is that these requests will decrease the amount of time that you wait before being seen by our physicians.       _____________________________________________________________  Should you have questions after your visit to [facility], please contact our office at [phone] between the hours of 8:30 a.m. and 4:30 p.m.  Voicemails left after 4:30 p.m. will not be returned until the following business day.  For prescription refill requests, have your pharmacy contact our office.         Resources For Cancer Patients and their Caregivers ? American Cancer Society: Can assist with transportation, wigs, general needs, runs Look Good Feel Better.        [phone] ? Cancer  Care: Provides financial assistance, online support groups, medication/co-pay assistance.  1-800-813-HOPE 6712935504(4673) ? [facility] Assists [place] cancer patients and their families through emotional , educational and financial support.  [phone] ? [place] DSS Where to apply for food stamps, Medicaid and utility assistance. (304)339-3886941-260-4048 ? [facility]: Transportation to medical appointments. 682-464-2425(684)613-1501 ? Social Security Administration: May apply for disability if have a Stage IV cancer. [phone] (628) [phone] ? [place] [facility], Dis[facility] and Transit Services: Assists with nutrition, care and transit needs. 903-405-31328472263851  Cancer Center Support Programs: @10RELATIVEDAYS @ > Cancer Support Group  2nd Tuesday of the month 1pm-2pm, Journey Room  > Creative Journey  3rd Tuesday of the month 1130am-1pm, Journey Room  > Look Good Feel Better  1st Wednesday of the month 10am-12 noon, Journey Room (Call American Cancer Society to register [phone])   Influenza Virus Vaccine injection (Fluarix) What is this medicine? INFLUENZA VIRUS VACCINE (in floo EN zuh VAHY ruhs vak SEEN) helps to reduce the risk of getting influenza also known as the flu. This medicine may be used for other purposes; ask your health care provider or pharmacist if you have questions. What should I tell my health care provider before I take this medicine? They need to know if you have any of these conditions: -bleeding disorder like hemophilia -fever or infection -Guillain-Barre syndrome or other neurological problems -immune system problems -infection with the human immunodeficiency virus (HIV) or AIDS -low blood platelet counts -multiple sclerosis -an unusual  or allergic reaction to influenza virus vaccine, eggs, chicken proteins, latex, gentamicin, other medicines, foods, dyes or preservatives -pregnant or trying to get pregnant -breast-feeding How should I use this  medicine? This vaccine is for injection into a muscle. It is given by a health care professional. A copy of Vaccine Information Statements will be given before each vaccination. Read this sheet carefully each time. The sheet may change frequently. Talk to your pediatrician regarding the use of this medicine in children. Special care may be needed. Overdosage: If you think you have taken too much of this medicine contact a poison control center or emergency room at once. NOTE: This medicine is only for you. Do not share this medicine with others. What if I miss a dose? This does not apply. What may interact with this medicine? -chemotherapy or radiation therapy -medicines that lower your immune system like etanercept, anakinra, infliximab, and adalimumab -medicines that treat or prevent blood clots like warfarin -phenytoin -steroid medicines like prednisone or cortisone -theophylline -vaccines This list may not describe all possible interactions. Give your health care provider a list of all the medicines, herbs, non-prescription drugs, or dietary supplements you use. Also tell them if you smoke, drink alcohol, or use illegal drugs. Some items may interact with your medicine. What should I watch for while using this medicine? Report any side effects that do not go away within 3 days to your doctor or health care professional. Call your health care provider if any unusual symptoms occur within 6 weeks of receiving this vaccine. You may still catch the flu, but the illness is not usually as bad. You cannot get the flu from the vaccine. The vaccine will not protect against colds or other illnesses that may cause fever. The vaccine is needed every year. What side effects may I notice from receiving this medicine? Side effects that you should report to your doctor or health care professional as soon as possible: -allergic reactions like skin rash, itching or hives, swelling of the face, lips, or  tongue Side effects that usually do not require medical attention (report to your doctor or health care professional if they continue or are bothersome): -fever -headache -muscle aches and pains -pain, tenderness, redness, or swelling at site where injected -weak or tired This list may not describe all possible side effects. Call your doctor for medical advice about side effects. You may report side effects to FDA at 1-800-FDA-1088. Where should I keep my medicine? This vaccine is only given in a clinic, pharmacy, doctor's office, or other health care setting and will not be stored at home. NOTE: This sheet is a summary. It may not cover all possible information. If you have questions about this medicine, talk to your doctor, pharmacist, or health care provider.    2016, Elsevier/Gold Standard. (2007-08-26 09:30:40)

## 2015-11-15 NOTE — Progress Notes (Signed)
[name] Specking, MD [address] / [place] [address]   DIAGNOSIS: No matching staging information was found for the patient. 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 male returns for follow-up of hemolytic anemia, history of positive coombs. Currently he is on prednisone 10 mg qod. Has had consultation at [facility] in the past. Has been vaccinated in preparation for splenectomy.  [name] is unaccompanied. I personally reviewed and went over laboratory studies with the patient. I spoke with the patient about his [facility] visit, as well.  He continues to take low dose prednisone every other day. He mentions his father took prednisone long term and would cough enough to break his ribs. The patient remarks that he is okay with taking prednisone as long as it is working. He also remarks that his father took high doses of prednisone  Notes he continues to take SL B12. No IM B12 in approximately 2 months.   He has not had a flu shot this year. He had a shingles vaccine about 5 years ago.   He denies abdominal pain. His appetite is good. No bleeding or bruising.  Wants to avoid splenectomy if at all possible.    MEDICAL HISTORY: Past Medical History:  Diagnosis Date  . Coronary atherosclerosis of native coronary artery    Multivessel, LVEF 59%  . Essential hypertension, benign   . Hemolytic anemia (HCC)   . 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 Hyperlipidemia   . Pernicious anemia     SURGICAL HISTORY: Past Surgical History:  Procedure Laterality Date  . CORONARY ARTERY BYPASS GRAFT  2004   LIMA to LAD, SVG to OM, SVG to PDA    SOCIAL HISTORY: Social History   Social History  . Marital status: Married    Spouse name: N/A  . Number of  children: N/A  . Years of education: N/A   Occupational History  . Full time    Social History Main Topics  . Smoking status: Former Smoker    Types: Cigarettes    Quit date: 02/11/2002  . Smokeless tobacco: Never Used  . Alcohol use No  . Drug use: No  . Sexual activity: Not on file   Other Topics Concern  . Not on file   Social History Narrative  . No narrative on file  Born in [place], [place] where his father was in Pharmacy school. Move back to [place], [place] and has lived there most of his life. 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 [employer] after 37 years. Mother 69 yo has had CVA;s and other medical problems. Father died in 11's of emphysema. He has two sisters by adoption. Considered family Married on one contract with three children and 20 grandchildren all basically alive and well. Wife of 43 years has diabetes and had a GI bleed with anemia, non immune.  TOBACCO: Quit 11 years ago after smoking one half pack per day since  ALCOHOL: NA HIV RISK FACTORS: Denies TOXIN EXPOSURES: None. He was never in [employer].  FAMILY HISTORY: Family History  Problem Relation Age of Onset  . Dementia Mother   . Cancer Mother     colon  . COPD Father   .  Ulcerative colitis Father   . Heart attack Father     Review of Systems  Constitutional: Negative for chills, fever, malaise/fatigue and weight loss.  HENT: Negative for congestion, hearing loss, nosebleeds, sore throat and tinnitus.   Eyes: Negative for blurred vision, double vision, pain and discharge.  Respiratory: Negative for cough, hemoptysis, sputum production, shortness of breath and wheezing.   Cardiovascular: Negative for chest pain, palpitations, claudication, leg swelling and PND.  Gastrointestinal: Negative for abdominal pain, blood in stool, constipation, diarrhea, heartburn, melena, nausea and vomiting.  Genitourinary: Negative for dysuria, frequency, hematuria and  urgency.  Musculoskeletal: Negative for falls, joint pain and myalgias.  Skin: Negative for itching and rash.  Neurological: Negative for dizziness, tingling, tremors, sensory change, speech change, focal weakness, seizures, loss of consciousness, weakness and headaches.  Endo/Heme/Allergies: Does not bruise/bleed easily.  Psychiatric/Behavioral: Negative for depression, memory loss, substance abuse and suicidal ideas. The patient is not nervous/anxious and does not have insomnia.   14 point review of systems was performed and is negative except as detailed under history of present illness and above   PHYSICAL EXAMINATION  ECOG PERFORMANCE STATUS: 0 - Asymptomatic  Vitals:   11/15/15 1116  BP: 134/72  Pulse: 70  Resp: 16  Temp: 98 F (36.7 C)   Physical Exam  Constitutional: He is oriented to person, place, and time and well-developed, well-nourished, and in no distress.  HENT:  Head: Normocephalic and atraumatic.  Nose: Nose normal.  Mouth/Throat: Oropharynx is clear and moist. No oropharyngeal exudate.  Eyes: Conjunctivae and EOM are normal. Pupils are equal, round, and reactive to light. Right eye exhibits no discharge. Left eye exhibits no discharge. No scleral icterus.  Neck: Normal range of motion. Neck supple. No tracheal deviation present. No thyromegaly present.  Cardiovascular: Normal rate, regular rhythm and normal heart sounds.  Exam reveals no gallop and no friction rub.   No murmur heard. Pulmonary/Chest: Effort normal and breath sounds normal. He has no wheezes. He has no rales.  Abdominal: Soft. Bowel sounds are normal. He exhibits no distension and no mass. There is no tenderness. There is no rebound and no guarding.  Musculoskeletal: Normal range of motion. He exhibits no edema.  Lymphadenopathy:    He has no cervical adenopathy.  Neurological: He is alert and oriented to person, place, and time. He has normal reflexes. No cranial nerve deficit. Gait normal.  Coordination normal.  Skin: Skin is warm and dry. No rash noted.  Psychiatric: Mood, memory, affect and judgment normal.  Nursing note and vitals reviewed.   LABORATORY DATA: Data was reviewed as listed. CBC    Component Value Date/Time   WBC 7.5 11/15/2015 1020   RBC 4.17 (L) 11/15/2015 1020   RBC 4.17 (L) 11/15/2015 1020   HGB 13.4 11/15/2015 1020   HCT 39.9 11/15/2015 1020   PLT 194 11/15/2015 1020   MCV 95.7 11/15/2015 1020   MCH 32.1 11/15/2015 1020   MCHC 33.6 11/15/2015 1020   RDW 13.1 11/15/2015 1020   LYMPHSABS 1.4 11/15/2015 1020   MONOABS 0.8 11/15/2015 1020   EOSABS 0.3 11/15/2015 1020   BASOSABS 0.0 11/15/2015 1020    CMP     Component Value Date/Time   NA 138 11/15/2015 1020   K 3.7 11/15/2015 1020   CL 105 11/15/2015 1020   CO2 27 11/15/2015 1020   GLUCOSE 114 (H) 11/15/2015 1020   BUN 16 11/15/2015 1020   CREATININE 0.95 11/15/2015 1020   CALCIUM 8.7 (L) 11/15/2015  1020   PROT 6.5 11/15/2015 1020   ALBUMIN 3.9 11/15/2015 1020   AST 25 11/15/2015 1020   ALT 27 11/15/2015 1020   ALKPHOS 57 11/15/2015 1020   BILITOT 1.5 (H) 11/15/2015 1020   GFRNONAA >60 11/15/2015 1020   GFRAA >60 11/15/2015 1020   Results for NAZARIO, RUSSOM (MRN [id number])   Ref. Range 09/15/2015 08:52 11/01/2015 14:23  Vitamin B12 Latest Ref Range: 180 - 914 pg/mL 3,042 (H) 2,616 (H)   Results for NICCOLO, BURGGRAF (MRN [id number])   Ref. Range 11/08/2015 12:27 11/15/2015 10:20  LDH Latest Ref Range: 98 - 192 U/L 206 (H) 206 (H)    RADIOLOGY: I have reviewed the images below and agree with the reported results   No results found.   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  He is on prednisone 10 mg every other day. I have reviewed his prior medical  records. He was originally seen by my partner, Dr. Candise Che.   Apparently, at the time of his original remission. 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 He was responsive to steroids. He did receive Rituxan. It is uncertain if this contributed to his remission.  We again discussed multiple options moving forward including tapering prednisone to 5 mg qod. I also advised him that trying rituxan is very reasonable. He is very hesitant to proceed with splenectomy and I understand his relucance. We can certainly send him back to his hematologist at [facility]. If he so desires.  He also has pernicious anemia.He is only on SL B12. B12 levels are more than adequate. He can actually back off on his B12 dosing. We will keep an eye and his B12 levels. Some patients with pernicious anemia can be maintained on sublingual therapy without intramuscular therapy. I advised him, however, that this will have to be monitored and he is agreeable.  [facility] CONSULTATION 01/12/2015:     He will receive the flu vaccine after our visit today.  He does not need any refills at this time.   He will return for follow up in 6 weeks and return for labs every 3 weeks.   All questions were answered. The patient knows to call the clinic with any problems, questions or concerns. We can certainly see the patient much sooner if necessary.  This document serves as a record of services personally performed by Loma Messing, MD. It was created on her behalf by Delana Meyer, a trained medical scribe. The creation of this record is based on the scribe's personal observations and the provider's statements to them. This document has been checked and approved by the attending provider.  I have reviewed the above documentation for accuracy and completeness and I agree with the above.  This note was electronically signed. Arvil Chaco, MD  11/15/2015

## 2015-11-15 NOTE — Progress Notes (Signed)
Kellie SimmeringJoseph W Virtue presents today for injection per MD orders. Flu Vaccine administered IM in left Upper Arm. Administration without incident. Patient tolerated well.

## 2015-11-16 ENCOUNTER — Encounter (HOSPITAL_COMMUNITY): Payer: Self-pay | Admitting: Hematology & Oncology

## 2015-11-17 ENCOUNTER — Telehealth (HOSPITAL_COMMUNITY): Payer: Self-pay | Admitting: *Deleted

## 2015-11-17 NOTE — Telephone Encounter (Signed)
-----   Message from Ellouise Newerhomas S Kefalas, PA-C sent at 11/16/2015  6:36 PM EDT ----- Let him know he can decrease his B12 to QOD.  TK

## 2015-11-17 NOTE — Telephone Encounter (Signed)
Pt's wife notified that he can decrease his vitamin b12 tablet to every other day.

## 2015-12-06 ENCOUNTER — Encounter (HOSPITAL_COMMUNITY): Payer: Medicare Other

## 2015-12-06 DIAGNOSIS — D591 Other autoimmune hemolytic anemias: Secondary | ICD-10-CM | POA: Diagnosis not present

## 2015-12-06 DIAGNOSIS — D510 Vitamin B12 deficiency anemia due to intrinsic factor deficiency: Secondary | ICD-10-CM

## 2015-12-06 DIAGNOSIS — D5911 Warm autoimmune hemolytic anemia: Secondary | ICD-10-CM

## 2015-12-06 LAB — CBC WITH DIFFERENTIAL/PLATELET
BASOS ABS: 0 10*3/uL (ref 0.0–0.1)
Basophils Relative: 0 %
Eosinophils Absolute: 0 10*3/uL (ref 0.0–0.7)
Eosinophils Relative: 1 %
HEMATOCRIT: 40.6 % (ref 39.0–52.0)
Hemoglobin: 13.8 g/dL (ref 13.0–17.0)
LYMPHS ABS: 0.7 10*3/uL (ref 0.7–4.0)
LYMPHS PCT: 8 %
MCH: 33 pg (ref 26.0–34.0)
MCHC: 34 g/dL (ref 30.0–36.0)
MCV: 97.1 fL (ref 78.0–100.0)
MONO ABS: 0.4 10*3/uL (ref 0.1–1.0)
Monocytes Relative: 5 %
NEUTROS ABS: 7.2 10*3/uL (ref 1.7–7.7)
Neutrophils Relative %: 86 %
Platelets: 198 10*3/uL (ref 150–400)
RBC: 4.18 MIL/uL — AB (ref 4.22–5.81)
RDW: 13.5 % (ref 11.5–15.5)
WBC: 8.3 10*3/uL (ref 4.0–10.5)

## 2015-12-06 LAB — LACTATE DEHYDROGENASE: LDH: 206 U/L — ABNORMAL HIGH (ref 98–192)

## 2015-12-06 LAB — RETICULOCYTES
RBC.: 4.18 MIL/uL — AB (ref 4.22–5.81)
RETIC COUNT ABSOLUTE: 250.8 10*3/uL — AB (ref 19.0–186.0)
Retic Ct Pct: 6 % — ABNORMAL HIGH (ref 0.4–3.1)

## 2015-12-08 ENCOUNTER — Telehealth (HOSPITAL_COMMUNITY): Payer: Self-pay | Admitting: *Deleted

## 2015-12-08 NOTE — Telephone Encounter (Signed)
Patient notified of his lab results. (LDH, CBC, retic count)

## 2015-12-27 ENCOUNTER — Other Ambulatory Visit (HOSPITAL_COMMUNITY): Payer: Medicare Other

## 2015-12-27 ENCOUNTER — Ambulatory Visit (HOSPITAL_COMMUNITY): Payer: Medicare Other | Admitting: Hematology & Oncology

## 2016-01-02 ENCOUNTER — Encounter (HOSPITAL_COMMUNITY): Payer: Self-pay | Admitting: Hematology & Oncology

## 2016-01-02 ENCOUNTER — Encounter (HOSPITAL_COMMUNITY): Payer: Medicare Other | Attending: Hematology & Oncology | Admitting: Hematology & Oncology

## 2016-01-02 ENCOUNTER — Encounter (HOSPITAL_COMMUNITY): Payer: Medicare Other

## 2016-01-02 VITALS — BP 145/63 | HR 80 | Temp 97.7°F | Resp 16 | Wt 232.6 lb

## 2016-01-02 DIAGNOSIS — D591 Other autoimmune hemolytic anemias: Secondary | ICD-10-CM | POA: Insufficient documentation

## 2016-01-02 DIAGNOSIS — D510 Vitamin B12 deficiency anemia due to intrinsic factor deficiency: Secondary | ICD-10-CM | POA: Insufficient documentation

## 2016-01-02 DIAGNOSIS — R161 Splenomegaly, not elsewhere classified: Secondary | ICD-10-CM

## 2016-01-02 DIAGNOSIS — D599 Acquired hemolytic anemia, unspecified: Secondary | ICD-10-CM

## 2016-01-02 DIAGNOSIS — D5911 Warm autoimmune hemolytic anemia: Secondary | ICD-10-CM

## 2016-01-02 LAB — CBC WITH DIFFERENTIAL/PLATELET
BASOS ABS: 0.1 10*3/uL (ref 0.0–0.1)
BASOS PCT: 1 %
EOS ABS: 0.3 10*3/uL (ref 0.0–0.7)
Eosinophils Relative: 3 %
HCT: 40.1 % (ref 39.0–52.0)
HEMOGLOBIN: 13.5 g/dL (ref 13.0–17.0)
Lymphocytes Relative: 10 %
Lymphs Abs: 0.9 10*3/uL (ref 0.7–4.0)
MCH: 33.5 pg (ref 26.0–34.0)
MCHC: 33.7 g/dL (ref 30.0–36.0)
MCV: 99.5 fL (ref 78.0–100.0)
MONOS PCT: 8 %
Monocytes Absolute: 0.8 10*3/uL (ref 0.1–1.0)
NEUTROS PCT: 78 %
Neutro Abs: 7 10*3/uL (ref 1.7–7.7)
Platelets: 212 10*3/uL (ref 150–400)
RBC: 4.03 MIL/uL — ABNORMAL LOW (ref 4.22–5.81)
RDW: 13.6 % (ref 11.5–15.5)
WBC: 9 10*3/uL (ref 4.0–10.5)

## 2016-01-02 LAB — RETICULOCYTES
RBC.: 4.03 MIL/uL — ABNORMAL LOW (ref 4.22–5.81)
Retic Count, Absolute: 302.3 10*3/uL — ABNORMAL HIGH (ref 19.0–186.0)
Retic Ct Pct: 7.5 % — ABNORMAL HIGH (ref 0.4–3.1)

## 2016-01-02 LAB — LACTATE DEHYDROGENASE: LDH: 224 U/L — AB (ref 98–192)

## 2016-01-02 NOTE — Progress Notes (Signed)
[name], MD [address] / [place] [address]   DIAGNOSIS: No matching staging information was found for the patient. 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 male returns for follow-up of hemolytic anemia, history of positive coombs. Currently he is on prednisone 10 mg qod. Has had consultation at [facility] in the past. Has been vaccinated in preparation for splenectomy.  [name] Page is unaccompanied. I personally reviewed and went over laboratory studies with the patient.  He asks if a half tablet of prednisone daily will work.   He has no concerns or complaints at this time. He has received a flu shot this year.  He feels well. Energy is unchanged. No nausea, no excessive fatigue. No change in appetite.   MEDICAL HISTORY: Past Medical History:  Diagnosis Date  . Coronary atherosclerosis of native coronary artery    Multivessel, LVEF 59%  . Essential hypertension, benign   . Hemolytic anemia (HCC)   . 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 Hyperlipidemia   . Pernicious anemia     SURGICAL HISTORY: Past Surgical History:  Procedure Laterality Date  . CORONARY ARTERY BYPASS GRAFT  2004   LIMA to LAD, SVG to OM, SVG to PDA    SOCIAL HISTORY: Social History   Social History  . Marital status: Married    Spouse name: N/A  . Number of children: N/A  . Years of education: N/A   Occupational History  . Full time    Social History Main Topics  . Smoking status: Former Smoker    Types: Cigarettes    Quit date: 02/11/2002  . Smokeless tobacco: Never Used  . Alcohol use No  . Drug use: No  . Sexual activity: Not on file   Other Topics Concern  . Not on file   Social History Narrative  . No narrative on file  Born in  [place], [place] where his father was in Pharmacy school. Move back to [place], [place] and has lived there most of his life. 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 [employer] after 37 years. Mother 72 yo has had CVA;s and other medical problems. Father died in 30's of emphysema. He has two sisters by adoption. Considered family Married on one contract with three children and 20 grandchildren all basically alive and well. Wife of 43 years has diabetes and had a GI bleed with anemia, non immune.  TOBACCO: Quit 11 years ago after smoking one half pack per day since  ALCOHOL: NA HIV RISK FACTORS: Denies TOXIN EXPOSURES: None. He was never in [employer].  FAMILY HISTORY: Family History  Problem Relation Age of Onset  . Dementia Mother   . Cancer Mother     colon  . COPD Father   . Ulcerative colitis Father   . Heart attack Father     Review of Systems  Constitutional: Negative for chills, fever, malaise/fatigue and weight loss.  HENT: Negative for congestion, hearing loss, nosebleeds, sore throat and tinnitus.   Eyes: Negative for blurred vision, double vision, pain and discharge.  Respiratory: Negative for cough, hemoptysis, sputum production, shortness of breath and wheezing.   Cardiovascular: Negative for chest pain, palpitations, claudication, leg swelling and PND.  Gastrointestinal: Negative for abdominal pain, blood in stool, constipation, diarrhea, heartburn, melena, nausea and vomiting.  Genitourinary: Negative for dysuria, frequency, hematuria and urgency.  Musculoskeletal: Negative for falls, joint pain and myalgias.  Skin: Negative for itching and rash.  Neurological: Negative for dizziness, tingling, tremors, sensory change, speech change, focal weakness, seizures, loss of consciousness, weakness and headaches.  Endo/Heme/Allergies: Does not bruise/bleed easily.  Psychiatric/Behavioral: Negative for depression, memory loss, substance  abuse and suicidal ideas. The patient is not nervous/anxious and does not have insomnia.   14 point review of systems was performed and is negative except as detailed under history of present illness and above   PHYSICAL EXAMINATION  ECOG PERFORMANCE STATUS: 0 - Asymptomatic  Vitals:   01/02/16 1012  BP: (!) 145/63  Pulse: 80  Resp: 16  Temp: 97.7 F (36.5 C)   Physical Exam  Constitutional: He is oriented to person, place, and time and well-developed, well-nourished, and in no distress.  HENT:  Head: Normocephalic and atraumatic.  Nose: Nose normal.  Mouth/Throat: Oropharynx is clear and moist. No oropharyngeal exudate.  Eyes: Conjunctivae and EOM are normal. Pupils are equal, round, and reactive to light. Right eye exhibits no discharge. Left eye exhibits no discharge. No scleral icterus.  Neck: Normal range of motion. Neck supple. No tracheal deviation present. No thyromegaly present.  Cardiovascular: Normal rate, regular rhythm and normal heart sounds.  Exam reveals no gallop and no friction rub.   No murmur heard. Pulmonary/Chest: Effort normal and breath sounds normal. He has no wheezes. He has no rales.  Abdominal: Soft. Bowel sounds are normal. He exhibits no distension and no mass. There is no tenderness. There is no rebound and no guarding.  Musculoskeletal: Normal range of motion. He exhibits no edema.  Lymphadenopathy:    He has no cervical adenopathy.  Neurological: He is alert and oriented to person, place, and time. He has normal reflexes. No cranial nerve deficit. Gait normal. Coordination normal.  Skin: Skin is warm and dry. No rash noted.  Psychiatric: Mood, memory, affect and judgment normal.  Nursing note and vitals reviewed.   LABORATORY DATA: Data was reviewed as listed. CBC    Component Value Date/Time   WBC 9.0 01/02/2016 0916   RBC 4.03 (L) 01/02/2016 0916   RBC 4.03 (L) 01/02/2016 0916   HGB 13.5 01/02/2016 0916   HCT 40.1 01/02/2016 0916   PLT  212 01/02/2016 0916   MCV 99.5 01/02/2016 0916   MCH 33.5 01/02/2016 0916   MCHC 33.7 01/02/2016 0916   RDW 13.6 01/02/2016 0916   LYMPHSABS 0.9 01/02/2016 0916   MONOABS 0.8 01/02/2016 0916   EOSABS 0.3 01/02/2016 0916   BASOSABS 0.1 01/02/2016 0916    CMP     Component Value Date/Time   NA 138 11/15/2015 1020   K 3.7 11/15/2015 1020   CL 105 11/15/2015 1020   CO2 27 11/15/2015 1020   GLUCOSE 114 (H) 11/15/2015 1020   BUN 16 11/15/2015 1020   CREATININE 0.95 11/15/2015 1020   CALCIUM 8.7 (L) 11/15/2015 1020   PROT 6.5 11/15/2015 1020   ALBUMIN 3.9 11/15/2015 1020   AST 25 11/15/2015 1020   ALT 27 11/15/2015 1020   ALKPHOS 57 11/15/2015 1020   BILITOT 1.5 (H) 11/15/2015 1020   GFRNONAA >60 11/15/2015 1020   GFRAA >60 11/15/2015 1020   Results for JAIS, DEMIR (MRN [id number]) as of 01/02/2016 10:43  Ref. Range 09/15/2015 08:52 11/01/2015 14:23 11/15/2015 10:20  Vitamin B12 Latest  Ref Range: 180 - 914 pg/mL 3,042 (H) 2,616 (H) 2,934 (H)  Results for Kellie SimmeringCHANDLER, Xayvion W (MRN [id number]) as of 01/02/2016 10:43  Ref. Range 11/15/2015 10:20 12/06/2015 12:01 01/02/2016 09:16  LDH Latest Ref Range: 98 - 192 U/L 206 (H) 206 (H) 224 (H)   Results for Kellie SimmeringCHANDLER, Jabron W (MRN [id number]) as of 01/02/2016 17:57  Ref. Range 11/08/2015 12:27 11/15/2015 10:20 12/06/2015 12:01 01/02/2016 09:16  RBC. 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  He is  on prednisone 10 mg every other day. I have reviewed his prior medical records. He was originally seen by my partner, Dr. Candise CheKale.   Apparently, at the time of his original remission. 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 He was responsive to steroids. He did receive Rituxan. It is uncertain if this contributed to his remission.  We again discussed multiple options moving forward including tapering prednisone to 5 mg qod. I also advised him that trying rituxan is very reasonable. He is very hesitant to proceed with splenectomy and I understand his reluctance. We can certainly [facility] If he so desires.  He also has pernicious anemia.He is only on SL B12. B12 levels are more than adequate. He can actually back off on his B12 dosing. We will keep an eye and his B12 levels. Some patients with pernicious anemia can be maintained on sublingual therapy without intramuscular therapy. I advised him, however, that this will have to be monitored and he is agreeable.  [facility] CONSULTATION 01/12/2015:      His reticulocyte count is increasing, c/w his rising MCV. He may ultimately fail his current steroid dose but we will continue to closely monitor. Right now H/H are excellent. He wants to try and taper his steroids to 5 mg qod. Will still check labs every 3 weeks, any significant changes in counts will prompt increasing his prednisone.  He will return for formal follow up in 2 months and return for labs every 3 weeks. He will be notified of labs at each lab draw once results are available.   Orders Placed This Encounter  Procedures  . CBC with Differential    Standing Status:   Standing    Number of Occurrences:   12    Standing Expiration Date:   01/01/2017  . Lactate dehydrogenase    Standing Status:   Standing    Number of Occurrences:   12    Standing Expiration Date:   01/01/2017  . Reticulocytes    Standing Status:   Standing    Number of  Occurrences:   12    Standing Expiration Date:   01/01/2017  . Haptoglobin    Standing Status:   Standing    Number of Occurrences:   12    Standing Expiration Date:   01/01/2017     All questions were answered. The patient  knows to call the clinic with any problems, questions or concerns. We can certainly see the patient much sooner if necessary.  This document serves as a record of services personally performed by Loma MessingShannon Penland, MD. It was created on her behalf by Delana MeyerElizabeth Ashley, a trained medical scribe. The creation of this record is based on the scribe's personal observations and the provider's statements to them. This document has been checked and approved by the attending provider.  I have reviewed the above documentation for accuracy and completeness and I agree with the above.  This note was electronically signed. Arvil ChacoPenland,Shannon Kristen, MD  01/02/2016

## 2016-01-02 NOTE — Patient Instructions (Signed)
[facility] at [facility] Discharge Instructions  RECOMMENDATIONS MADE BY THE CONSULTANT AND ANY TEST RESULTS WILL BE SENT TO YOUR REFERRING PHYSICIAN.  Exam with Dr. Galen ManilaPenland today. You will be scheduled for labs every three weeks and will return to the clinic in two months for a doctor appointment.    Thank you for choosing [facility] at [facility] to provide your oncology and hematology care.  To afford each patient quality time with our provider, please arrive at least 15 minutes before your scheduled appointment time.   Beginning January 23rd 2017 lab work for the [facility] will be done in the  Main lab at [facility] on 1st floor. If you have a lab appointment with the Cancer Center please come in thru the  Main Entrance and check in at the main information desk  You need to re-schedule your appointment should you arrive 10 or more minutes late.  We strive to give you quality time with our providers, and arriving late affects you and other patients whose appointments are after yours.  Also, if you no show three or more times for appointments you may be dismissed from the clinic at the providers discretion.     Again, thank you for choosing [facility].  Our hope is that these requests will decrease the amount of time that you wait before being seen by our physicians.       _____________________________________________________________  Should you have questions after your visit to [facility], please contact our office at [phone] between the hours of 8:30 a.m. and 4:30 p.m.  Voicemails left after 4:30 p.m. will not be returned until the following business day.  For prescription refill requests, have your pharmacy contact our office.         Resources For Cancer Patients and their Caregivers ? American Cancer Society: Can assist with transportation, wigs, general needs, runs Look Good Feel Better.         [phone] ? Cancer Care: Provides financial assistance, online support groups, medication/co-pay assistance.  1-800-813-HOPE 573-866-5308(4673) ? [facility] Assists [place] cancer patients and their families through emotional , educational and financial support.  [phone] ? [place] DSS Where to apply for food stamps, Medicaid and utility assistance. [phone] 061 5522 ? RCATS: Transportation to medical appointments. ([phone] [phone] ? Social Security Administration: May apply for disability if have a Stage IV cancer. [phone] ? [place] A[place], Disability and Transit Services: Assists with nutrition, care and transit needs. 9044321188([phone]  Cancer Center Support Programs: @10RELATIVEDAYS @ > Cancer Support Group  2nd Tuesday of the month 1pm-2pm, Journey Room  > Creative Journey  3rd Tuesday of the month 1130am-1pm, Journey Room  > Look Good Feel Better  1st Wednesday of the month 10am-12 noon, Journey Room (Call American Cancer Society to register [phone])

## 2016-01-23 ENCOUNTER — Encounter (HOSPITAL_COMMUNITY): Payer: Medicare Other | Attending: Hematology & Oncology

## 2016-01-23 DIAGNOSIS — D510 Vitamin B12 deficiency anemia due to intrinsic factor deficiency: Secondary | ICD-10-CM | POA: Diagnosis not present

## 2016-01-23 DIAGNOSIS — D591 Other autoimmune hemolytic anemias: Secondary | ICD-10-CM | POA: Insufficient documentation

## 2016-01-23 DIAGNOSIS — D5911 Warm autoimmune hemolytic anemia: Secondary | ICD-10-CM

## 2016-01-23 LAB — CBC WITH DIFFERENTIAL/PLATELET
BASOS ABS: 0.1 10*3/uL (ref 0.0–0.1)
BASOS PCT: 1 %
EOS ABS: 0.3 10*3/uL (ref 0.0–0.7)
EOS PCT: 4 %
HCT: 37.3 % — ABNORMAL LOW (ref 39.0–52.0)
Hemoglobin: 12.2 g/dL — ABNORMAL LOW (ref 13.0–17.0)
Lymphocytes Relative: 17 %
Lymphs Abs: 1.1 10*3/uL (ref 0.7–4.0)
MCH: 33.8 pg (ref 26.0–34.0)
MCHC: 32.7 g/dL (ref 30.0–36.0)
MCV: 103.3 fL — ABNORMAL HIGH (ref 78.0–100.0)
MONO ABS: 0.6 10*3/uL (ref 0.1–1.0)
Monocytes Relative: 8 %
Neutro Abs: 4.8 10*3/uL (ref 1.7–7.7)
Neutrophils Relative %: 71 %
PLATELETS: 219 10*3/uL (ref 150–400)
RBC: 3.61 MIL/uL — ABNORMAL LOW (ref 4.22–5.81)
RDW: 13.2 % (ref 11.5–15.5)
WBC: 6.8 10*3/uL (ref 4.0–10.5)

## 2016-01-23 LAB — LACTATE DEHYDROGENASE: LDH: 241 U/L — AB (ref 98–192)

## 2016-01-23 LAB — RETICULOCYTES
RBC.: 3.67 MIL/uL — AB (ref 4.22–5.81)
RETIC CT PCT: 9.5 % — AB (ref 0.4–3.1)
Retic Count, Absolute: 348.7 10*3/uL — ABNORMAL HIGH (ref 19.0–186.0)

## 2016-01-24 LAB — HAPTOGLOBIN: Haptoglobin: <10 mg/dL — ABNORMAL LOW (ref 34–200)

## 2016-01-26 ENCOUNTER — Other Ambulatory Visit (HOSPITAL_COMMUNITY): Payer: Self-pay

## 2016-01-26 DIAGNOSIS — D510 Vitamin B12 deficiency anemia due to intrinsic factor deficiency: Secondary | ICD-10-CM

## 2016-01-26 MED ORDER — PREDNISONE 10 MG PO TABS: ORAL_TABLET | ORAL | 1 refills | Status: DC

## 2016-02-13 ENCOUNTER — Encounter (HOSPITAL_COMMUNITY): Payer: Medicare Other | Attending: Hematology & Oncology

## 2016-02-13 ENCOUNTER — Other Ambulatory Visit (HOSPITAL_COMMUNITY): Payer: Self-pay | Admitting: *Deleted

## 2016-02-13 DIAGNOSIS — D591 Other autoimmune hemolytic anemias: Secondary | ICD-10-CM | POA: Diagnosis present

## 2016-02-13 DIAGNOSIS — D510 Vitamin B12 deficiency anemia due to intrinsic factor deficiency: Secondary | ICD-10-CM | POA: Diagnosis not present

## 2016-02-13 DIAGNOSIS — D5911 Warm autoimmune hemolytic anemia: Secondary | ICD-10-CM

## 2016-02-13 DIAGNOSIS — IMO0001 Reserved for inherently not codable concepts without codable children: Secondary | ICD-10-CM

## 2016-02-13 LAB — RETICULOCYTES
RBC.: 3.8 MIL/uL — AB (ref 4.22–5.81)
RETIC CT PCT: 8 % — AB (ref 0.4–3.1)
Retic Count, Absolute: 304 10*3/uL — ABNORMAL HIGH (ref 19.0–186.0)

## 2016-02-13 LAB — CBC WITH DIFFERENTIAL/PLATELET
BASOS ABS: 0.1 10*3/uL (ref 0.0–0.1)
BASOS PCT: 1 %
Eosinophils Absolute: 0.3 10*3/uL (ref 0.0–0.7)
Eosinophils Relative: 2 %
HEMATOCRIT: 39.3 % (ref 39.0–52.0)
HEMOGLOBIN: 12.8 g/dL — AB (ref 13.0–17.0)
LYMPHS PCT: 7 %
Lymphs Abs: 0.9 10*3/uL (ref 0.7–4.0)
MCH: 33.7 pg (ref 26.0–34.0)
MCHC: 32.6 g/dL (ref 30.0–36.0)
MCV: 103.4 fL — AB (ref 78.0–100.0)
MONO ABS: 0.7 10*3/uL (ref 0.1–1.0)
Monocytes Relative: 5 %
NEUTROS ABS: 11.3 10*3/uL — AB (ref 1.7–7.7)
NEUTROS PCT: 85 %
Platelets: 309 10*3/uL (ref 150–400)
RBC: 3.8 MIL/uL — ABNORMAL LOW (ref 4.22–5.81)
RDW: 13.8 % (ref 11.5–15.5)
WBC: 13.2 10*3/uL — ABNORMAL HIGH (ref 4.0–10.5)

## 2016-02-13 LAB — LACTATE DEHYDROGENASE: LDH: 209 U/L — ABNORMAL HIGH (ref 98–192)

## 2016-02-13 MED ORDER — AZITHROMYCIN 250 MG PO TABS: ORAL_TABLET | ORAL | 0 refills | Status: DC

## 2016-02-14 LAB — HAPTOGLOBIN: HAPTOGLOBIN: 55 mg/dL (ref 34–200)

## 2016-03-04 ENCOUNTER — Encounter (HOSPITAL_COMMUNITY): Payer: Self-pay | Admitting: Hematology & Oncology

## 2016-03-04 ENCOUNTER — Encounter (HOSPITAL_BASED_OUTPATIENT_CLINIC_OR_DEPARTMENT_OTHER): Payer: Medicare Other | Admitting: Hematology & Oncology

## 2016-03-04 ENCOUNTER — Encounter (HOSPITAL_COMMUNITY): Payer: Medicare Other

## 2016-03-04 VITALS — BP 146/67 | HR 79 | Temp 98.0°F | Resp 18 | Ht 73.5 in | Wt 234.9 lb

## 2016-03-04 DIAGNOSIS — D591 Autoimmune hemolytic anemia, unspecified: Secondary | ICD-10-CM

## 2016-03-04 DIAGNOSIS — D510 Vitamin B12 deficiency anemia due to intrinsic factor deficiency: Secondary | ICD-10-CM

## 2016-03-04 DIAGNOSIS — R161 Splenomegaly, not elsewhere classified: Secondary | ICD-10-CM

## 2016-03-04 DIAGNOSIS — D5911 Warm autoimmune hemolytic anemia: Secondary | ICD-10-CM

## 2016-03-04 LAB — CBC WITH DIFFERENTIAL/PLATELET
BASOS PCT: 0 %
Basophils Absolute: 0 10*3/uL (ref 0.0–0.1)
Eosinophils Absolute: 0.2 10*3/uL (ref 0.0–0.7)
Eosinophils Relative: 3 %
HEMATOCRIT: 37 % — AB (ref 39.0–52.0)
Hemoglobin: 12.4 g/dL — ABNORMAL LOW (ref 13.0–17.0)
Lymphocytes Relative: 16 %
Lymphs Abs: 1.1 10*3/uL (ref 0.7–4.0)
MCH: 34 pg (ref 26.0–34.0)
MCHC: 33.5 g/dL (ref 30.0–36.0)
MCV: 101.4 fL — AB (ref 78.0–100.0)
MONO ABS: 0.6 10*3/uL (ref 0.1–1.0)
MONOS PCT: 9 %
NEUTROS ABS: 5 10*3/uL (ref 1.7–7.7)
Neutrophils Relative %: 72 %
Platelets: 196 10*3/uL (ref 150–400)
RBC: 3.65 MIL/uL — ABNORMAL LOW (ref 4.22–5.81)
RDW: 13.4 % (ref 11.5–15.5)
WBC: 7 10*3/uL (ref 4.0–10.5)

## 2016-03-04 LAB — RETICULOCYTES
RBC.: 3.65 MIL/uL — ABNORMAL LOW (ref 4.22–5.81)
Retic Count, Absolute: 288.4 10*3/uL — ABNORMAL HIGH (ref 19.0–186.0)
Retic Ct Pct: 7.9 % — ABNORMAL HIGH (ref 0.4–3.1)

## 2016-03-04 LAB — LACTATE DEHYDROGENASE: LDH: 243 U/L — ABNORMAL HIGH (ref 98–192)

## 2016-03-04 NOTE — Progress Notes (Signed)
[name], MD [address] / [place] [address]  PROGRESS NOTE  DIAGNOSIS: Cancer Staging No matching staging information was found for the patient. 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 male returns for follow-up of hemolytic anemia, history of positive coombs. Currently he is on prednisone 10 mg qod. Has had consultation at [facility] in the past. Has been vaccinated in preparation for splenectomy.  [name] Page is unaccompanied. I have reviewed the labs with the patient.   He was been sick with an upper respiratory infection last week. He is better now. He has a history of upper respiratory infections. Last year it turned into pneumonia.   He is still taking his prednisone. Denies any other complaints.  He is thinking about taking Rituxan. It put him into a long remission in the past. He would like to wait until the spring however. Energy is ok. Appetite is good.   MEDICAL HISTORY: Past Medical History:  Diagnosis Date  . Coronary atherosclerosis of native coronary artery    Multivessel, LVEF 59%  . Essential hypertension, benign   . Hemolytic anemia (HCC)   . 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 Hyperlipidemia   . Pernicious anemia     SURGICAL HISTORY: Past Surgical History:  Procedure Laterality Date  . CORONARY ARTERY BYPASS GRAFT  2004   LIMA to LAD, SVG to OM, SVG to PDA    SOCIAL HISTORY: Social History   Social History  . Marital status: Married    Spouse name: N/A  . Number of children: N/A  . Years of education: N/A   Occupational History  . Full time    Social History Main Topics  . Smoking status: Former Smoker    Types: Cigarettes    Quit date: 02/11/2002  . Smokeless tobacco: Never Used  . Alcohol use  No  . Drug use: No  . Sexual activity: Not on file   Other Topics Concern  . Not on file   Social History Narrative  . No narrative on file  Born in [place], [place] where his father was in Pharmacy school. Move back to [place], [place] and has lived there most of his life. 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 College after 37 years. Mother 44 yo has had CVA;s and other medical problems. Father died in 79's of emphysema. He has two sisters by adoption. Considered family Married on one contract with three children and 20 grandchildren all basically alive and well. Wife of 43 years has diabetes and had a GI bleed with anemia, non immune.  TOBACCO: Quit 11 years ago after smoking one half pack per day since  ALCOHOL: NA HIV RISK FACTORS: Denies TOXIN EXPOSURES: None. He was never in [employer].  FAMILY HISTORY: Family History  Problem Relation Age of Onset  . Dementia Mother   . Cancer Mother     colon  . COPD Father   . Ulcerative colitis Father   . Heart attack Father     Review of Systems  Constitutional: Negative.   HENT: Negative.   Eyes: Negative.   Respiratory: Negative.        Upper respiratory infection last week.   Cardiovascular: Negative.  Gastrointestinal: Negative.   Genitourinary: Negative.   Musculoskeletal: Negative.   Skin: Negative.   Neurological: Negative.   Endo/Heme/Allergies: Negative.   Psychiatric/Behavioral: Negative.   All other systems reviewed and are negative. 14 point review of systems was performed and is negative except as detailed under history of present illness and above   PHYSICAL EXAMINATION  ECOG PERFORMANCE STATUS: 0 - Asymptomatic  Vitals:   03/04/16 1154  BP: (!) 146/67  Pulse: 79  Resp: 18  Temp: 98 F (36.7 C)    Physical Exam  Constitutional: He is oriented to person, place, and time and well-developed, well-nourished, and in no distress.  Pt was able to get on exam  table without assistance.  Wears glasses.   HENT:  Head: Normocephalic and atraumatic.  Nose: Nose normal.  Mouth/Throat: Oropharynx is clear and moist. No oropharyngeal exudate.  Eyes: Conjunctivae and EOM are normal. Pupils are equal, round, and reactive to light. Right eye exhibits no discharge. Left eye exhibits no discharge. No scleral icterus.  Neck: Normal range of motion. Neck supple. No tracheal deviation present. No thyromegaly present.  Cardiovascular: Normal rate, regular rhythm and normal heart sounds.  Exam reveals no gallop and no friction rub.   No murmur heard. Pulmonary/Chest: Effort normal and breath sounds normal. He has no wheezes. He has no rales.  Abdominal: Soft. Bowel sounds are normal. He exhibits no distension and no mass. There is no tenderness. There is no rebound and no guarding.  Musculoskeletal: Normal range of motion. He exhibits no edema.  Lymphadenopathy:    He has no cervical adenopathy.  Neurological: He is alert and oriented to person, place, and time. He has normal reflexes. No cranial nerve deficit. Gait normal. Coordination normal.  Skin: Skin is warm and dry. No rash noted.  Psychiatric: Mood, memory, affect and judgment normal.  Nursing note and vitals reviewed.  LABORATORY DATA: Data was reviewed as listed. CBC    Component Value Date/Time   WBC 7.0 03/04/2016 1022   RBC 3.65 (L) 03/04/2016 1022   RBC 3.65 (L) 03/04/2016 1022   HGB 12.4 (L) 03/04/2016 1022   HCT 37.0 (L) 03/04/2016 1022   PLT 196 03/04/2016 1022   MCV 101.4 (H) 03/04/2016 1022   MCH 34.0 03/04/2016 1022   MCHC 33.5 03/04/2016 1022   RDW 13.4 03/04/2016 1022   LYMPHSABS 1.1 03/04/2016 1022   MONOABS 0.6 03/04/2016 1022   EOSABS 0.2 03/04/2016 1022   BASOSABS 0.0 03/04/2016 1022    CMP     Component Value Date/Time   NA 138 11/15/2015 1020   K 3.7 11/15/2015 1020   CL 105 11/15/2015 1020   CO2 27 11/15/2015 1020   GLUCOSE 114 (H) 11/15/2015 1020   BUN 16  11/15/2015 1020   CREATININE 0.95 11/15/2015 1020   CALCIUM 8.7 (L) 11/15/2015 1020   PROT 6.5 11/15/2015 1020   ALBUMIN 3.9 11/15/2015 1020   AST 25 11/15/2015 1020   ALT 27 11/15/2015 1020   ALKPHOS 57 11/15/2015 1020   BILITOT 1.5 (H) 11/15/2015 1020   GFRNONAA >60 11/15/2015 1020   GFRAA >60 11/15/2015 1020   Results for [name] SimmeringCHANDLER, [name] Page (MRN [id number]) as of 03/04/2016 11:49  Ref. Range 09/15/2015 08:52 11/01/2015 14:23 11/15/2015 10:20  Vitamin B12 Latest Ref Range: 180 - 914 pg/mL 3,042 (H) 2,616 (H) 2,934 (H)    Results for [name] SimmeringCHANDLER, [name] Page (MRN [id number]) as of 03/04/2016 11:49  Ref. Range 12/06/2015 12:01 01/02/2016 09:16 01/23/2016 10:10 02/13/2016 10:33  03/04/2016 10:22  LDH Latest Ref Range: 98 - 192 U/L 206 (H) 224 (H) 241 (H) 209 (H) 243 (H)    Results for ATILLA, ZOLLNER (MRN [id number]) as of 03/04/2016 11:49  Ref. 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 He still has obvious hemolysis. I have encouraged him to consider single agent Rituxan. He is probably going to consider this in the spsring.   Apparently, at the time of his original remission. 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 He was responsive to steroids. He did receive Rituxan. It is uncertain if this contributed to his remission.  He also has pernicious anemia.He is only on SL B12. B12 levels are more than adequate. He can actually back off on his B12 dosing. We will keep an eye and his B12 levels. Some  patients with pernicious anemia can be maintained on sublingual therapy without intramuscular therapy. I advised him, however, that this will have to be monitored and he is agreeable.  He will return for formal follow up in 1 months and return for labs every 3 weeks. He will be notified of labs at each lab draw once results are available. Add the following to his next blood draw:  Orders Placed This Encounter  Procedures  . Vitamin B12    Standing Status:   Future    Standing Expiration Date:   04/01/2016  . Ferritin    Standing Status:   Future    Standing Expiration Date:   04/01/2016  . Folate    Standing Status:   Future    Standing Expiration Date:   04/01/2016  . IgG, IgA, IgM    Standing Status:   Future    Standing Expiration Date:   04/01/2016  .   All questions were answered. The patient knows to call the clinic with any problems, questions or concerns. We can certainly see the patient much sooner if necessary.  This document serves as a record of services personally performed by Loma Messing, MD. It was created on her behalf by Delana Meyer, a trained medical scribe. The creation of this record is based on the scribe's personal observations and the provider's statements to them. This document has been checked and approved by the attending provider.  I have reviewed the above documentation for accuracy and completeness and I agree with the above.  This note was electronically signed. Arvil Chaco, MD  03/04/2016

## 2016-03-04 NOTE — Patient Instructions (Signed)
[facility] at [facility] Discharge Instructions  RECOMMENDATIONS MADE BY THE CONSULTANT AND ANY TEST RESULTS WILL BE SENT TO YOUR REFERRING PHYSICIAN.  You were seen today by Dr. Galen ManilaPenland Follow up as scheduled every 3 weeks for lab work Follow up in clinic in 2 months  Thank you for choosing [facility] at [facility] to provide your oncology and hematology care.  To afford each patient quality time with our provider, please arrive at least 15 minutes before your scheduled appointment time.    If you have a lab appointment with the Cancer Center please come in thru the  Main Entrance and check in at the main information desk  You need to re-schedule your appointment should you arrive 10 or more minutes late.  We strive to give you quality time with our providers, and arriving late affects you and other patients whose appointments are after yours.  Also, if you no show three or more times for appointments you may be dismissed from the clinic at the providers discretion.     Again, thank you for choosing [facility].  Our hope is that these requests will decrease the amount of time that you wait before being seen by our physicians.       _____________________________________________________________  Should you have questions after your visit to [facility], please contact our office at [phone] between the hours of 8:30 a.m. and 4:30 p.m.  Voicemails left after 4:30 p.m. will not be returned until the following business day.  For prescription refill requests, have your pharmacy contact our office.       Resources For Cancer Patients and their Caregivers ? American Cancer Society: Can assist with transportation, wigs, general needs, runs Look Good Feel Better.        [phone] ? Cancer Care: Provides financial assistance, online support groups, medication/co-pay assistance.  1-800-813-HOPE 226-635-9602(4673) ? [facility] Assists [place] cancer patients and their families through emotional , educational and financial support.  [phone] ? [place] DSS Where to apply for food stamps, Medicaid and utility assistance. 787-319-1642([phone] ? RCATS: Transportation to medical appointments. 941-464-8973([phone] ? Social Security Administration: May apply for disability if have a Stage IV cancer. [phone] ? [place] Aging, Disability and Transit Services: Assists with nutrition, care and transit needs. [phone]  Cancer Center Support Programs: @10RELATIVEDAYS @ > Cancer Support Group  2nd Tuesday of the month 1pm-2pm, Journey Room  > Creative Journey  3rd Tuesday of the month 1130am-1pm, Journey Room  > Look Good Feel Better  1st Wednesday of the month 10am-12 noon, Journey Room (Call American Cancer Society to register [phone])

## 2016-03-05 LAB — HAPTOGLOBIN: Haptoglobin: <10 mg/dL — ABNORMAL LOW (ref 34–200)

## 2016-03-14 ENCOUNTER — Encounter (HOSPITAL_COMMUNITY): Payer: Self-pay | Admitting: Hematology & Oncology

## 2016-03-25 ENCOUNTER — Encounter (HOSPITAL_COMMUNITY): Payer: Medicare Other | Attending: Oncology

## 2016-03-25 DIAGNOSIS — D591 Autoimmune hemolytic anemia, unspecified: Secondary | ICD-10-CM

## 2016-03-25 DIAGNOSIS — D5911 Warm autoimmune hemolytic anemia: Secondary | ICD-10-CM

## 2016-03-25 DIAGNOSIS — D510 Vitamin B12 deficiency anemia due to intrinsic factor deficiency: Secondary | ICD-10-CM | POA: Insufficient documentation

## 2016-03-25 LAB — CBC WITH DIFFERENTIAL/PLATELET
BASOS PCT: 1 %
Basophils Absolute: 0.1 10*3/uL (ref 0.0–0.1)
EOS ABS: 0.2 10*3/uL (ref 0.0–0.7)
EOS PCT: 3 %
HCT: 38.2 % — ABNORMAL LOW (ref 39.0–52.0)
Hemoglobin: 13 g/dL (ref 13.0–17.0)
LYMPHS PCT: 17 %
Lymphs Abs: 1.3 10*3/uL (ref 0.7–4.0)
MCH: 34.4 pg — ABNORMAL HIGH (ref 26.0–34.0)
MCHC: 34 g/dL (ref 30.0–36.0)
MCV: 101.1 fL — AB (ref 78.0–100.0)
MONO ABS: 0.5 10*3/uL (ref 0.1–1.0)
Monocytes Relative: 7 %
Neutro Abs: 5.4 10*3/uL (ref 1.7–7.7)
Neutrophils Relative %: 72 %
PLATELETS: 206 10*3/uL (ref 150–400)
RBC: 3.78 MIL/uL — ABNORMAL LOW (ref 4.22–5.81)
RDW: 13.1 % (ref 11.5–15.5)
WBC: 7.4 10*3/uL (ref 4.0–10.5)

## 2016-03-25 LAB — VITAMIN B12: Vitamin B-12: 1754 pg/mL — ABNORMAL HIGH (ref 180–914)

## 2016-03-25 LAB — LACTATE DEHYDROGENASE: LDH: 226 U/L — AB (ref 98–192)

## 2016-03-25 LAB — RETICULOCYTES
RBC.: 3.78 MIL/uL — AB (ref 4.22–5.81)
Retic Count, Absolute: 313.7 10*3/uL — ABNORMAL HIGH (ref 19.0–186.0)
Retic Ct Pct: 8.3 % — ABNORMAL HIGH (ref 0.4–3.1)

## 2016-03-25 LAB — FERRITIN: Ferritin: 138 ng/mL (ref 24–336)

## 2016-03-25 LAB — FOLATE: FOLATE: 91.2 ng/mL (ref >5.9)

## 2016-03-26 LAB — HAPTOGLOBIN: Haptoglobin: <10 mg/dL — ABNORMAL LOW (ref 34–200)

## 2016-03-26 LAB — IGG, IGA, IGM
IGG (IMMUNOGLOBIN G), SERUM: 906 mg/dL (ref 700–1600)
IgA: 208 mg/dL (ref 61–437)
IgM, Serum: 266 mg/dL — ABNORMAL HIGH (ref 20–172)

## 2016-04-01 ENCOUNTER — Other Ambulatory Visit (HOSPITAL_COMMUNITY): Payer: Self-pay | Admitting: Oncology

## 2016-04-01 DIAGNOSIS — IMO0001 Reserved for inherently not codable concepts without codable children: Secondary | ICD-10-CM

## 2016-04-15 ENCOUNTER — Encounter (HOSPITAL_COMMUNITY): Payer: Medicare Other | Attending: Oncology

## 2016-04-15 DIAGNOSIS — D591 Other autoimmune hemolytic anemias: Secondary | ICD-10-CM | POA: Insufficient documentation

## 2016-04-15 DIAGNOSIS — D510 Vitamin B12 deficiency anemia due to intrinsic factor deficiency: Secondary | ICD-10-CM | POA: Insufficient documentation

## 2016-04-15 DIAGNOSIS — D5911 Warm autoimmune hemolytic anemia: Secondary | ICD-10-CM

## 2016-04-15 LAB — CBC WITH DIFFERENTIAL/PLATELET
BASOS ABS: 0.1 10*3/uL (ref 0.0–0.1)
Basophils Relative: 1 %
Eosinophils Absolute: 0.2 10*3/uL (ref 0.0–0.7)
Eosinophils Relative: 1 %
HEMATOCRIT: 44 % (ref 39.0–52.0)
Hemoglobin: 14.8 g/dL (ref 13.0–17.0)
LYMPHS PCT: 10 %
Lymphs Abs: 1.1 10*3/uL (ref 0.7–4.0)
MCH: 33.4 pg (ref 26.0–34.0)
MCHC: 33.6 g/dL (ref 30.0–36.0)
MCV: 99.3 fL (ref 78.0–100.0)
Monocytes Absolute: 0.6 10*3/uL (ref 0.1–1.0)
Monocytes Relative: 5 %
NEUTROS ABS: 8.7 10*3/uL — AB (ref 1.7–7.7)
Neutrophils Relative %: 83 %
PLATELETS: 278 10*3/uL (ref 150–400)
RBC: 4.43 MIL/uL (ref 4.22–5.81)
RDW: 13.1 % (ref 11.5–15.5)
WBC: 10.6 10*3/uL — AB (ref 4.0–10.5)

## 2016-04-15 LAB — RETICULOCYTES
RBC.: 4.43 MIL/uL (ref 4.22–5.81)
RETIC COUNT ABSOLUTE: 239.2 10*3/uL — AB (ref 19.0–186.0)
RETIC CT PCT: 5.4 % — AB (ref 0.4–3.1)

## 2016-04-15 LAB — LACTATE DEHYDROGENASE: LDH: 207 U/L — ABNORMAL HIGH (ref 98–192)

## 2016-04-16 LAB — HAPTOGLOBIN: Haptoglobin: <10 mg/dL — ABNORMAL LOW (ref 34–200)

## 2016-04-23 NOTE — Progress Notes (Signed)
Cardiology Office Note  Date: 04/24/2016   ID: [name] Page, [name] Page 22-Oct-1945, MRN [id number]  PCP: [name], MD  Primary Cardiologist: Nona Dell, MD   Chief Complaint  Patient presents with  . Coronary Artery Disease    History of Present Illness: [name] Page is a 71 y.o. male last seen in December 2016. He presents for a routine follow-up visit. 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 Has very long days. No reported angina symptoms. He has not been exercising regularly, plans to get back to using his treadmill and stationary bicycle in the spring.  Cardiac catheterization from 2014 is outlined below. We continued medical therapy. Current cardiac regimen includes aspirin, omega-3 supplements, flaxseed oil, and Toprol-XL. I personally reviewed his ECG today which shows sinus rhythm with occasional PVCs, old inferior infarct pattern.  He continues to follow with oncology, history of autoimmune hemolytic anemia. Follow-up hemoglobin recently was 14.8.  Past Medical History:  Diagnosis Date  . Coronary atherosclerosis of native coronary artery    Multivessel, LVEF 59%  . Essential hypertension, benign   . Hemolytic anemia (HCC)   . 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 Hyperlipidemia   . Pernicious anemia     Past Surgical History:  Procedure Laterality Date  . CORONARY ARTERY BYPASS GRAFT  2004   LIMA to LAD, SVG to OM, SVG to PDA    Current Outpatient Prescriptions  Medication Sig Dispense Refill  . aspirin EC 81 MG tablet Take 81 mg by mouth daily.    . Aspirin-Acetaminophen-Caffeine (GOODYS EXTRA STRENGTH) 249 [id number] [id number] MG PACK Take by mouth.    Marland Kitchen b complex vitamins tablet Take 1 tablet by mouth daily.    . calcium carbonate (OS-CAL) 600 MG TABS tablet Take 600 mg by mouth daily with breakfast.    . calcium citrate-vitamin D (CITRACAL+D) 315-200 MG-UNIT tablet Take by mouth.    . Cranberry 500 MG CAPS  Take by mouth.    . Fish Oil-Cholecalciferol (FISH OIL + D3 PO) Take 1 capsule by mouth daily.    . Flaxseed, Linseed, (FLAX SEED OIL PO) Take 1 capsule by mouth daily.    . folic acid (FOLVITE) 1 MG tablet Take 1 mg by mouth daily.    Marland Kitchen GARLIC OIL PO Take 1 capsule by mouth daily.    Marland Kitchen glipiZIDE (GLUCOTROL) 5 MG tablet Take 5 mg by mouth every other day.     . insulin aspart (NOVOLOG) 100 UNIT/ML injection Inject 0-6 Units into the skin 3 (three) times daily before meals.    Marland Kitchen levothyroxine (SYNTHROID, LEVOTHROID) 50 MCG tablet Take 50 mcg by mouth daily.    . Magnesium 250 MG TABS Take 1 tablet by mouth daily.    . metFORMIN (GLUCOPHAGE) 500 MG tablet Take 500 mg by mouth daily with breakfast.    . metoprolol succinate (TOPROL-XL) 25 MG 24 hr tablet Take 25 mg by mouth daily.      . Misc Natural Products (PROSTATE CONTROL PO) Take 1 tablet by mouth daily.    . Multiple Vitamin (MULTIVITAMIN) tablet Take 1 tablet by mouth daily.    . Multiple Vitamins-Minerals (MULTI FOR HIM 50+) TABS Take by mouth.    Ailene Ards 3-6-9 CAPS Take by mouth.    . predniSONE (DELTASONE) 10 MG tablet 10mg  by mouth every day. (Patient taking differently: Take 10 mg by mouth every other day. 10mg  by mouth every day.) 100  tablet 1  . psyllium (METAMUCIL) 58.6 % powder Take 1 packet by mouth 3 (three) times daily.    . Red Yeast Rice 600 MG TABS Take by mouth.    . Saw Palmetto 160 MG CAPS Take by mouth.    . vitamin B-12 (CYANOCOBALAMIN) 1000 MCG tablet Take 1,000 mcg by mouth daily.    Marland Kitchen Zn-Pyg Afri-Nettle-Saw Palmet (SAW PALMETTO COMPLEX PO) Take 2 capsules by mouth 2 (two) times daily.     No current facility-administered medications for this visit.    Allergies:  Patient has no known allergies.   Social History: The patient  reports that he quit smoking about 14 years ago. His smoking use included Cigarettes. He has never used smokeless tobacco. He reports that he does not drink alcohol or use drugs.   ROS:   Please see the history of present illness. Otherwise, complete review of systems is positive for recent upper respiratory tract infection treated with antibiotics and steroids.  All other systems are reviewed and negative.   Physical Exam: VS:  BP 120/78   Pulse 79   Ht 6\' 1"  (1.854 m)   Wt 230 lb 12.8 oz (104.7 kg)   SpO2 98%   BMI 30.45 kg/m , BMI Body mass index is 30.45 kg/m.  Wt Readings from Last 3 Encounters:  04/24/16 230 lb 12.8 oz (104.7 kg)  03/04/16 234 lb 14.4 oz (106.5 kg)  01/02/16 232 lb 9.6 oz (105.5 kg)    Overweight male in no acute distress.  HEENT: Conjunctiva and lids normal, oropharynx clear.  Neck: Supple, no elevated JVP or bruits. No thyromegaly.  Lungs: Decreased breath sounds without wheezing, nonlabored.  Cardiac: Regular rate and rhythm with ectopy, no S3.  Abdomen: Soft, nontender, bowel sounds present there  Extremities: Mild ankle edema, distal pulses one plus.  Skin: Warm and dry.  Musculoskeletal: No kyphosis.  Neuropsychiatric: Alert and oriented x3, affect appropriate.  ECG: I personally reviewed the tracing from 02/08/2015 which showed sinus rhythm with old inferolateral infarct pattern.  Recent Labwork: 11/15/2015: ALT 27; AST 25; BUN 16; Creatinine, Ser 0.95; Potassium 3.7; Sodium 138 04/15/2016: Hemoglobin 14.8; Platelets 278   Other Studies Reviewed Today:  Cardiac catheterization 09/30/2012: Coronary angiography: Coronary dominance: right  Left mainstem: Widely patent with mild irregularity but no significant stenosis. Divides into the LAD and left circumflex.  Left anterior descending (LAD): There is moderately tight 70% proximal LAD stenosis. The first diagonal branch is patent. The mid LAD fills competitively from the mammary artery.  Left circumflex (LCx): There is a small intermediate branch. The AV groove circumflex is patent. The mid AV circumflex has a 60% stenosis. After the second obtuse marginal, the distal AV  circumflex is subtotally occluded with TIMI 1 flow beyond the stenosis.  Right coronary artery (RCA): Severely diseased throughout the proximal RCA. The mid vessel is totally occluded.  Saphenous vein graft to obtuse marginal is widely patent with no significant stenosis. The obtuse marginal branches have no significant disease beyond the graft insertion site.  Saphenous vein graft to PDA is widely patent with no significant stenosis. The PDA has no significant disease.  LIMA to LAD: Widely patent throughout. The LAD beyond the LIMA insertion site is patent without significant disease it reaches the left ventricular apex.  Left ventriculography: There is hypokinesis of the inferior wall. The other LV segments contract normally. The estimated left ventricular ejection fraction is 55%.  Final Conclusions:   1. Severe three-vessel native coronary artery  disease 2. Status post aortocoronary bypass surgery with continued patency of all bypass grafts 3. Mild segmental left ventricular systolic dysfunction.  Assessment and Plan:  1. Multivessel CAD status post CABG in 2004, patent bypass grafts documented in 2014. He reports no active angina on medical therapy, ECG reviewed. We will continue with observation.  2. Hyperlipidemia with history of statin intolerance. Continues on omega-3 supplements and flaxseed oil. Lab work followed by Dr. Sherril CroonVyas.  3.  Essential hypertension, blood pressure is well controlled today. No changes made to current regimen.  4. History of hemolytic anemia, followed by oncology and on steroids. Recent hemoglobin was up to 14.8.  Current medicines were reviewed with the patient today.   Orders Placed This Encounter  Procedures  . EKG 12-Lead    Disposition: Follow-up in one year, sooner if needed.  Signed, Jonelle SidleSamuel G. McDowell, MD, [facility] 04/24/2016 9:33 AM    [facility] [address], [place], [place] [address] Phone: [phone]  [phone]; Fax: [phone]) [phone]

## 2016-04-24 ENCOUNTER — Encounter: Payer: Self-pay | Admitting: Cardiology

## 2016-04-24 ENCOUNTER — Ambulatory Visit (INDEPENDENT_AMBULATORY_CARE_PROVIDER_SITE_OTHER): Payer: Medicare Other | Admitting: Cardiology

## 2016-04-24 VITALS — BP 120/78 | HR 79 | Ht 73.0 in | Wt 230.8 lb

## 2016-04-24 DIAGNOSIS — Z789 Other specified health status: Secondary | ICD-10-CM | POA: Diagnosis not present

## 2016-04-24 DIAGNOSIS — I10 Essential (primary) hypertension: Secondary | ICD-10-CM | POA: Diagnosis not present

## 2016-04-24 DIAGNOSIS — I2510 Atherosclerotic heart disease of native coronary artery without angina pectoris: Secondary | ICD-10-CM

## 2016-04-24 DIAGNOSIS — E782 Mixed hyperlipidemia: Secondary | ICD-10-CM

## 2016-04-24 NOTE — Patient Instructions (Signed)

## 2016-05-05 NOTE — Progress Notes (Addendum)
[facility] [address], [place] [address]   CLINIC:  Medical Oncology/Hematology  PCP:  [name], MD [address] [place] [address] [phone]   REASON FOR VISIT:  Follow-up for Hemolytic anemia AND B12 deficiency   CURRENT THERAPY: Prednisone 10 mg po every other day AND sublingual B12 supplementation     HISTORY OF PRESENT ILLNESS:  (From Dr. Chiquita Loth last note on 03/04/16)      INTERVAL HISTORY:  [name] Page 71 y.o. male returns for follow-up for hemolytic anemia and pernicious anemia.   At last visit with Dr. Galen Manila, they discussed resuming Rituxan therapy. He is doing well on prednisone 10 mg every other day dosing.  He does report recurrent upper respiratory infections; most recently was a few weeks ago where he was placed on antibiotics and received "a shot of steroids."  Remains on sublingual B12 and is tolerating without complaints.    He has intermittent bilateral LE peripheral neuropathy; he reports that he Hgb A1c has been excellent when monitored by his PCP, but he has sliding scale insulin that he takes if his blood sugars are >150.    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  Therefore, he is fatigued, but attributes much of this to his work.  His appetite is great.  Bowels are moving well; denies any blood in his stools or melena.  Denies any urinary complaints.  Overall, he is largely with no complaints today.     REVIEW OF SYSTEMS:  Review of Systems  Constitutional: Positive for fatigue (he relates fatigue to current work schedule). Negative for chills and fever.  HENT:  Negative.  Negative for lump/mass and nosebleeds.   Eyes: Negative.   Respiratory: Positive for cough (recovering from recent URI ). Negative for shortness of breath.   Cardiovascular: Negative.  Negative for chest pain and leg swelling.  Gastrointestinal: Negative.  Negative for abdominal pain, blood in stool,  constipation, diarrhea, nausea and vomiting.  Endocrine: Negative.   Genitourinary: Negative.  Negative for dysuria and hematuria.   Musculoskeletal: Negative.  Negative for arthralgias.  Skin: Negative.  Negative for rash.  Neurological: Positive for numbness (intermittent peripheral neuropathy to bilat feet ). Negative for dizziness and headaches.  Hematological: Negative.  Negative for adenopathy. Does not bruise/bleed easily.  Psychiatric/Behavioral: Negative.  Negative for depression and sleep disturbance. The patient is not nervous/anxious.      PAST MEDICAL/SURGICAL HISTORY:  Past Medical History:  Diagnosis Date  . Coronary atherosclerosis of native coronary artery    Multivessel, LVEF 59%  . Essential hypertension, benign   . Hemolytic anemia (HCC)   . 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 Hyperlipidemia   . Pernicious anemia    Past Surgical History:  Procedure Laterality Date  . CORONARY ARTERY BYPASS GRAFT  2004   LIMA to LAD, SVG to OM, SVG to PDA     SOCIAL HISTORY:  Social History   Social History  . Marital status: Married    Spouse name: N/A  . Number of children: N/A  . Years of education: N/A   Occupational History  . Full time    Social History Main Topics  . Smoking status: Former Smoker    Types: Cigarettes    Quit date: 02/11/2002  . Smokeless tobacco: Never Used  . Alcohol use No  . Drug use: No  . Sexual activity: Not  on file   Other Topics Concern  . Not on file   Social History Narrative  . No narrative on file    FAMILY HISTORY:  Family History  Problem Relation Age of Onset  . Dementia Mother   . Cancer Mother     colon  . COPD Father   . Ulcerative colitis Father   . Heart attack Father     CURRENT MEDICATIONS:  Outpatient Encounter Prescriptions as of 05/06/2016  Medication Sig Note  . aspirin EC 81 MG tablet Take 81 mg by mouth daily.   . Aspirin-Acetaminophen-Caffeine (GOODYS EXTRA STRENGTH)  [phone] MG PACK Take by mouth. 09/01/2015: [facility]  . b complex vitamins tablet Take 1 tablet by mouth daily.   . calcium carbonate (OS-CAL) 600 MG TABS tablet Take 600 mg by mouth daily with breakfast.   . calcium citrate-vitamin D (CITRACAL+D) 315-200 MG-UNIT tablet Take by mouth. 09/01/2015: Received from: [facility]  . Cranberry 500 MG CAPS Take by mouth. 09/01/2015: Received from: [facility]  . Fish Oil-Cholecalciferol (FISH OIL + D3 PO) Take 1 capsule by mouth daily.   . Flaxseed, Linseed, (FLAX SEED OIL PO) Take 1 capsule by mouth daily.   . folic acid (FOLVITE) 1 MG tablet Take 1 mg by mouth daily.   Marland Kitchen. GARLIC OIL PO Take 1 capsule by mouth daily.   Marland Kitchen. glipiZIDE (GLUCOTROL) 5 MG tablet Take 5 mg by mouth every other day.    . insulin aspart (NOVOLOG) 100 UNIT/ML injection Inject 0-6 Units into the skin 3 (three) times daily before meals.   Marland Kitchen. levothyroxine (SYNTHROID, LEVOTHROID) 50 MCG tablet Take 50 mcg by mouth daily.   . Magnesium 250 MG TABS Take 1 tablet by mouth daily.   . metFORMIN (GLUCOPHAGE) 500 MG tablet Take 500 mg by mouth daily with breakfast.   . metoprolol succinate (TOPROL-XL) 25 MG 24 hr tablet Take 25 mg by mouth daily.     . Misc Natural Products (PROSTATE CONTROL PO) Take 1 tablet by mouth daily.   . Multiple Vitamin (MULTIVITAMIN) tablet Take 1 tablet by mouth daily.   . Multiple Vitamins-Minerals (MULTI FOR HIM 50+) TABS Take by mouth. 09/01/2015: Received from: [facility]  . Omega 3-6-9 CAPS Take by mouth. 09/01/2015: Received from: [facility]  . predniSONE (DELTASONE) 10 MG tablet 10mg  by mouth every day. (Patient taking differently: Take 10 mg by mouth every other day. 10mg  by mouth every day.)   . psyllium (METAMUCIL) 58.6 % powder Take 1 packet by mouth 3 (three) times daily.   . Red Yeast Rice 600 MG TABS Take by mouth. 09/01/2015: Received from: [facility]  . Saw Palmetto 160 MG CAPS Take by mouth. 09/01/2015:  [facility]  . vitamin B-12 (CYANOCOBALAMIN) 1000 MCG tablet Take 1,000 mcg by mouth daily.   Marland Kitchen. Zn-Pyg Afri-Nettle-Saw Palmet (SAW PALMETTO COMPLEX PO) Take 2 capsules by mouth 2 (two) times daily.    No facility-administered encounter medications on file as of 05/06/2016.     ALLERGIES:  No Known Allergies   PHYSICAL EXAM:  ECOG Performance status: 0-1 - Symptomatic, but independent.   Vitals:   05/06/16 1322  BP: (!) 144/76  Pulse: 71  Resp: 16  Temp: 97.8 F (36.6 C)   Filed Weights   05/06/16 1322  Weight: 231 lb (104.8 kg)    Physical Exam  Constitutional: He is oriented to person, place, and time and well-developed, well-nourished, and  in no distress.  HENT:  Head: Normocephalic.  Mouth/Throat: Oropharynx is clear and moist. No oropharyngeal exudate.  Eyes: Conjunctivae are normal. Pupils are equal, round, and reactive to light. No scleral icterus.  Neck: Normal range of motion. Neck supple.  Cardiovascular: Normal rate, regular rhythm and normal heart sounds.   Pulmonary/Chest: Effort normal and breath sounds normal. No respiratory distress.  Abdominal: Soft. Bowel sounds are normal. There is no tenderness.  Musculoskeletal: Normal range of motion. He exhibits no edema.  Lymphadenopathy:    He has no cervical adenopathy.       Right: No supraclavicular adenopathy present.       Left: No supraclavicular adenopathy present.  Neurological: He is alert and oriented to person, place, and time. No cranial nerve deficit. Gait normal.  Skin: Skin is warm and dry. No rash noted.  Psychiatric: Mood, memory, affect and judgment normal.  Nursing note and vitals reviewed.    LABORATORY DATA:  I have reviewed the labs as listed.  CBC    Component Value Date/Time   WBC 7.8 05/06/2016 1231   RBC 4.02 (L) 05/06/2016 1231   RBC 4.02 (L) 05/06/2016 1231   HGB 13.5 05/06/2016 1231   HCT 39.4 05/06/2016 1231   PLT 156 05/06/2016 1231   MCV  98.0 05/06/2016 1231   MCH 33.6 05/06/2016 1231   MCHC 34.3 05/06/2016 1231   RDW 12.9 05/06/2016 1231   LYMPHSABS 1.6 05/06/2016 1231   MONOABS 0.6 05/06/2016 1231   EOSABS 0.2 05/06/2016 1231   BASOSABS 0.0 05/06/2016 1231   CMP Latest Ref Rng & Units 11/15/2015 11/08/2015 11/01/2015  Glucose 65 - 99 mg/dL 096(E) 454(U) 981(X)  BUN 6 - 20 mg/dL 16 18 91(Y)  Creatinine 0.61 - 1.24 mg/dL 7.82 9.56 2.13  Sodium 135 - 145 mmol/L 138 136 137  Potassium 3.5 - 5.1 mmol/L 3.7 4.5 4.2  Chloride 101 - 111 mmol/L 105 105 105  CO2 22 - 32 mmol/L 27 25 29   Calcium 8.9 - 10.3 mg/dL 0.8(M) 9.1 5.7(Q)  Total Protein 6.5 - 8.1 g/dL 6.5 6.9 6.5  Total Bilirubin 0.3 - 1.2 mg/dL 4.6(N) 6.2(X) 5.2(W)  Alkaline Phos 38 - 126 U/L 57 64 61  AST 15 - 41 U/L 25 27 25   ALT 17 - 63 U/L 27 34 29   Results for YAW, ESCOTO (MRN [id number])  Ref. Range 05/06/2016 12:31  RBC. 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 Currently on prednisone 10 mg every other day. Was previously treated with Rituxan x 4 doses in 07/2013 at [facility] in [place] by Dr. Mariel Sleet . Mr. Smelser also saw a specialist at [facility] in the past, and receiving appropriate vaccinations in preparation of splenectomy.  -Hemoglobin normal today at 13.5 g/dL. Retic count remains elevated at 221.1. Haptoglobin historically has been low at <10; haptoglobin results pending for today.  We will contact him when all lab results have been received and reviewed.  -IgG and IgA have historically been normal; results pending for today.   -Return for monthly labs; return to cancer center in ~10 weeks to follow-up  with Dr. Janyth Contes regarding any  possible treatment plan changes.   B12 deficiency:  -Previously on B12 injections; now on sublingual B12. Last serum vitamin B12 elevated at 1754.  -Continue B12 supplementation as adequate vitamin B supplementation can help with peripheral neuropathy, as well as his anemia; he understands that B vitamins are water soluble and he will void excess vitamin B12.   Frequent URIs:  -Likely secondary to chronic immunosuppression with prednisone. Encouraged him to follow-up with his PCP to ensure he has been adequately vaccinated given immune suppression.   Health maintenance/Wellness promotion:  -He is reportedly up-to-date with colonoscopy, vaccinations, and PSA screenings.  Encouraged continued follow-up with PCP for health maintenance. Encouraged increased exercise and healthy diet.     Dispo:  -Labs monthly.  -Return to cancer center in about 10 weeks for follow-up with Dr. Janyth Contes.    All questions were answered to patient's stated satisfaction. Encouraged patient to call with any new concerns or questions before his next visit to the cancer center and we can certain see him sooner, if needed.    Plan of care discussed with Dr. Janyth Contes, who agrees with the above aforementioned.     Orders placed this encounter:  Orders Placed This Encounter  Procedures  . CBC with Differential/Platelet  . Comprehensive metabolic panel  . Lactate dehydrogenase  . Vitamin B12  . Reticulocytes  . Haptoglobin      Lubertha Basque, NP [name] [phone]

## 2016-05-06 ENCOUNTER — Encounter (HOSPITAL_COMMUNITY): Payer: Self-pay | Admitting: Adult Health

## 2016-05-06 ENCOUNTER — Encounter (HOSPITAL_BASED_OUTPATIENT_CLINIC_OR_DEPARTMENT_OTHER): Payer: Medicare Other | Admitting: Adult Health

## 2016-05-06 ENCOUNTER — Encounter (HOSPITAL_COMMUNITY): Payer: Medicare Other

## 2016-05-06 VITALS — BP 144/76 | HR 71 | Temp 97.8°F | Resp 16 | Ht 73.0 in | Wt 231.0 lb

## 2016-05-06 DIAGNOSIS — E538 Deficiency of other specified B group vitamins: Secondary | ICD-10-CM | POA: Diagnosis not present

## 2016-05-06 DIAGNOSIS — D510 Vitamin B12 deficiency anemia due to intrinsic factor deficiency: Secondary | ICD-10-CM | POA: Diagnosis not present

## 2016-05-06 DIAGNOSIS — D591 Autoimmune hemolytic anemia, unspecified: Secondary | ICD-10-CM

## 2016-05-06 DIAGNOSIS — D5911 Warm autoimmune hemolytic anemia: Secondary | ICD-10-CM

## 2016-05-06 DIAGNOSIS — D599 Acquired hemolytic anemia, unspecified: Secondary | ICD-10-CM

## 2016-05-06 LAB — RETICULOCYTES
RBC.: 4.02 MIL/uL — ABNORMAL LOW (ref 4.22–5.81)
Retic Count, Absolute: 221.1 10*3/uL — ABNORMAL HIGH (ref 19.0–186.0)
Retic Ct Pct: 5.5 % — ABNORMAL HIGH (ref 0.4–3.1)

## 2016-05-06 LAB — CBC WITH DIFFERENTIAL/PLATELET
BASOS ABS: 0 10*3/uL (ref 0.0–0.1)
BASOS PCT: 1 %
Eosinophils Absolute: 0.2 10*3/uL (ref 0.0–0.7)
Eosinophils Relative: 3 %
HEMATOCRIT: 39.4 % (ref 39.0–52.0)
Hemoglobin: 13.5 g/dL (ref 13.0–17.0)
Lymphocytes Relative: 21 %
Lymphs Abs: 1.6 10*3/uL (ref 0.7–4.0)
MCH: 33.6 pg (ref 26.0–34.0)
MCHC: 34.3 g/dL (ref 30.0–36.0)
MCV: 98 fL (ref 78.0–100.0)
Monocytes Absolute: 0.6 10*3/uL (ref 0.1–1.0)
Monocytes Relative: 8 %
NEUTROS ABS: 5.3 10*3/uL (ref 1.7–7.7)
Neutrophils Relative %: 67 %
PLATELETS: 156 10*3/uL (ref 150–400)
RBC: 4.02 MIL/uL — ABNORMAL LOW (ref 4.22–5.81)
RDW: 12.9 % (ref 11.5–15.5)
WBC: 7.8 10*3/uL (ref 4.0–10.5)

## 2016-05-06 LAB — LACTATE DEHYDROGENASE: LDH: 256 U/L — ABNORMAL HIGH (ref 98–192)

## 2016-05-06 MED ORDER — PREDNISONE 10 MG PO TABS: 10.0000 mg | ORAL_TABLET | ORAL | 1 refills | Status: DC

## 2016-05-06 NOTE — Patient Instructions (Addendum)
[facility] at [facility] Discharge Instructions  RECOMMENDATIONS MADE BY THE CONSULTANT AND ANY TEST RESULTS WILL BE SENT TO YOUR REFERRING PHYSICIAN.  YOU WERE SEEN TODAY BY Lubertha BasqueGRETCHEN DAWSON NP. Continue monthly labs. Return in 10 weeks for follow up and labs.    Thank you for choosing [facility] at [facility] to provide your oncology and hematology care.  To afford each patient quality time with our provider, please arrive at least 15 minutes before your scheduled appointment time.    If you have a lab appointment with the Cancer Center please come in thru the  Main Entrance and check in at the main information desk  You need to re-schedule your appointment should you arrive 10 or more minutes late.  We strive to give you quality time with our providers, and arriving late affects you and other patients whose appointments are after yours.  Also, if you no show three or more times for appointments you may be dismissed from the clinic at the providers discretion.     Again, thank you for choosing [facility].  Our hope is that these requests will decrease the amount of time that you wait before being seen by our physicians.       _____________________________________________________________  Should you have questions after your visit to [facility], please contact our office at [phone] between the hours of 8:30 a.m. and 4:30 p.m.  Voicemails left after 4:30 p.m. will not be returned until the following business day.  For prescription refill requests, have your pharmacy contact our office.       Resources For Cancer Patients and their Caregivers ? American Cancer Society: Can assist with transportation, wigs, general needs, runs Look Good Feel Better.        [phone] ? Cancer Care: Provides financial assistance, online support groups, medication/co-pay assistance.  1-800-813-HOPE 7794564427(4673) ? [facility] Assists [place] cancer patients and their families through emotional , educational and financial support.  ([phone] [phone] ? [place] DSS Where to apply for food stamps, Medicaid and utility assistance. ([phone] ? RCATS: Transportation to medical appointments. 231-609-4659(805)674-9405 ? Social Security Administration: May apply for disability if have a Stage IV cancer. [phone] ? [place] A[facility], Disability and Transit Services: Assists with nutrition, care and transit needs. [phone]  Cancer Center Support Programs: @10RELATIVEDAYS @ > Cancer Support Group  2nd Tuesday of the month 1pm-2pm, Journey Room  > Creative Journey  3rd Tuesday of the month 1130am-1pm, Journey Room  > Look Good Feel Better  1st Wednesday of the month 10am-12 noon, Journey Room (Call American Cancer Society to register [phone])

## 2016-05-07 LAB — HAPTOGLOBIN

## 2016-06-03 ENCOUNTER — Other Ambulatory Visit (HOSPITAL_COMMUNITY): Payer: Medicare Other

## 2016-06-11 ENCOUNTER — Encounter (HOSPITAL_COMMUNITY): Payer: Medicare Other | Attending: Oncology

## 2016-06-11 DIAGNOSIS — D591 Autoimmune hemolytic anemia, unspecified: Secondary | ICD-10-CM

## 2016-06-11 LAB — COMPREHENSIVE METABOLIC PANEL
ALT: 42 U/L (ref 17–63)
ANION GAP: 8 (ref 5–15)
AST: 34 U/L (ref 15–41)
Albumin: 3.9 g/dL (ref 3.5–5.0)
Alkaline Phosphatase: 75 U/L (ref 38–126)
BILIRUBIN TOTAL: 1.9 mg/dL — AB (ref 0.3–1.2)
BUN: 16 mg/dL (ref 6–20)
CO2: 29 mmol/L (ref 22–32)
Calcium: 9.2 mg/dL (ref 8.9–10.3)
Chloride: 101 mmol/L (ref 101–111)
Creatinine, Ser: 1.06 mg/dL (ref 0.61–1.24)
GFR calc Af Amer: >60 mL/min (ref >60)
Glucose, Bld: 207 mg/dL — ABNORMAL HIGH (ref 65–99)
POTASSIUM: 4.2 mmol/L (ref 3.5–5.1)
Sodium: 138 mmol/L (ref 135–145)
Total Protein: 6.6 g/dL (ref 6.5–8.1)

## 2016-06-11 LAB — CBC WITH DIFFERENTIAL/PLATELET
BASOS PCT: 1 %
Basophils Absolute: 0 10*3/uL (ref 0.0–0.1)
EOS ABS: 0.3 10*3/uL (ref 0.0–0.7)
Eosinophils Relative: 4 %
HEMATOCRIT: 37.8 % — AB (ref 39.0–52.0)
Hemoglobin: 12.6 g/dL — ABNORMAL LOW (ref 13.0–17.0)
Lymphocytes Relative: 8 %
Lymphs Abs: 0.6 10*3/uL — ABNORMAL LOW (ref 0.7–4.0)
MCH: 34.1 pg — ABNORMAL HIGH (ref 26.0–34.0)
MCHC: 33.3 g/dL (ref 30.0–36.0)
MCV: 102.2 fL — AB (ref 78.0–100.0)
MONO ABS: 0.5 10*3/uL (ref 0.1–1.0)
MONOS PCT: 7 %
NEUTROS ABS: 5.9 10*3/uL (ref 1.7–7.7)
NEUTROS PCT: 80 %
Platelets: 200 10*3/uL (ref 150–400)
RBC: 3.7 MIL/uL — ABNORMAL LOW (ref 4.22–5.81)
RDW: 13.2 % (ref 11.5–15.5)
WBC: 7.3 10*3/uL (ref 4.0–10.5)

## 2016-06-11 LAB — LACTATE DEHYDROGENASE: LDH: 250 U/L — AB (ref 98–192)

## 2016-06-11 LAB — RETICULOCYTES
RBC.: 3.7 MIL/uL — ABNORMAL LOW (ref 4.22–5.81)
RETIC CT PCT: 7.8 % — AB (ref 0.4–3.1)
Retic Count, Absolute: 288.6 10*3/uL — ABNORMAL HIGH (ref 19.0–186.0)

## 2016-06-11 LAB — VITAMIN B12: Vitamin B-12: 1989 pg/mL — ABNORMAL HIGH (ref 180–914)

## 2016-06-12 LAB — HAPTOGLOBIN

## 2016-07-15 ENCOUNTER — Encounter (HOSPITAL_COMMUNITY): Payer: Medicare Other

## 2016-07-15 ENCOUNTER — Encounter (HOSPITAL_COMMUNITY): Payer: Medicare Other | Attending: Oncology | Admitting: Oncology

## 2016-07-15 ENCOUNTER — Encounter (HOSPITAL_COMMUNITY): Payer: Self-pay

## 2016-07-15 VITALS — BP 133/55 | HR 79 | Resp 18 | Ht 73.5 in | Wt 236.0 lb

## 2016-07-15 DIAGNOSIS — D510 Vitamin B12 deficiency anemia due to intrinsic factor deficiency: Secondary | ICD-10-CM

## 2016-07-15 DIAGNOSIS — D591 Autoimmune hemolytic anemia, unspecified: Secondary | ICD-10-CM

## 2016-07-15 DIAGNOSIS — R161 Splenomegaly, not elsewhere classified: Secondary | ICD-10-CM | POA: Diagnosis not present

## 2016-07-15 LAB — CBC WITH DIFFERENTIAL/PLATELET
Basophils Absolute: 0 10*3/uL (ref 0.0–0.1)
Basophils Relative: 0 %
Eosinophils Absolute: 0.2 10*3/uL (ref 0.0–0.7)
Eosinophils Relative: 3 %
HEMATOCRIT: 37.9 % — AB (ref 39.0–52.0)
Hemoglobin: 12.5 g/dL — ABNORMAL LOW (ref 13.0–17.0)
LYMPHS ABS: 1.2 10*3/uL (ref 0.7–4.0)
LYMPHS PCT: 18 %
MCH: 33.2 pg (ref 26.0–34.0)
MCHC: 33 g/dL (ref 30.0–36.0)
MCV: 100.5 fL — AB (ref 78.0–100.0)
MONO ABS: 0.6 10*3/uL (ref 0.1–1.0)
MONOS PCT: 9 %
NEUTROS ABS: 4.9 10*3/uL (ref 1.7–7.7)
Neutrophils Relative %: 70 %
PLATELETS: 198 10*3/uL (ref 150–400)
RBC: 3.77 MIL/uL — ABNORMAL LOW (ref 4.22–5.81)
RDW: 13.2 % (ref 11.5–15.5)
WBC: 6.9 10*3/uL (ref 4.0–10.5)

## 2016-07-15 LAB — COMPREHENSIVE METABOLIC PANEL
ALT: 40 U/L (ref 17–63)
AST: 31 U/L (ref 15–41)
Albumin: 3.9 g/dL (ref 3.5–5.0)
Alkaline Phosphatase: 67 U/L (ref 38–126)
Anion gap: 8 (ref 5–15)
BILIRUBIN TOTAL: 1.8 mg/dL — AB (ref 0.3–1.2)
BUN: 17 mg/dL (ref 6–20)
CHLORIDE: 105 mmol/L (ref 101–111)
CO2: 26 mmol/L (ref 22–32)
CREATININE: 0.98 mg/dL (ref 0.61–1.24)
Calcium: 8.9 mg/dL (ref 8.9–10.3)
Glucose, Bld: 190 mg/dL — ABNORMAL HIGH (ref 65–99)
Potassium: 3.9 mmol/L (ref 3.5–5.1)
Sodium: 139 mmol/L (ref 135–145)
TOTAL PROTEIN: 6.6 g/dL (ref 6.5–8.1)

## 2016-07-15 LAB — RETICULOCYTES
RBC.: 3.77 MIL/uL — AB (ref 4.22–5.81)
RETIC COUNT ABSOLUTE: 294.1 10*3/uL — AB (ref 19.0–186.0)
Retic Ct Pct: 7.8 % — ABNORMAL HIGH (ref 0.4–3.1)

## 2016-07-15 LAB — VITAMIN B12: VITAMIN B 12: 1170 pg/mL — AB (ref 180–914)

## 2016-07-15 LAB — LACTATE DEHYDROGENASE: LDH: 233 U/L — AB (ref 98–192)

## 2016-07-15 NOTE — Progress Notes (Signed)
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 male returns for follow-up of hemolytic anemia, history of positive coombs. Currently he is on prednisone 10 mg qod. [facility]. Has been vaccinated in preparation for splenectomy.  [name] Page is unaccompanied. I have reviewed the labs with the patient.  Patient is doing well. He denies any fatigue or generalized weakness. 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 He denies any chest pain, shortness breath, abdominal pain, focal weakness. He is compliant with his prednisone and vitamin B12.  MEDICAL HISTORY: Past Medical History:  Diagnosis Date  . Coronary atherosclerosis of native coronary artery    Multivessel, LVEF 59%  . Essential hypertension, benign   . Hemolytic anemia (HCC)   . 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 Hyperlipidemia   . Pernicious anemia     SURGICAL HISTORY: Past Surgical History:  Procedure Laterality Date  . CORONARY ARTERY BYPASS GRAFT  2004   LIMA to LAD, SVG to OM, SVG to PDA    SOCIAL HISTORY: Social History   Social History  . Marital status: Married    Spouse name: N/A  . Number of children: N/A  . Years of education: N/A   Occupational History  . Full time    Social History Main Topics  . Smoking status: Former Smoker    Types: Cigarettes    Quit date: 02/11/2002  . Smokeless tobacco: Never Used  . Alcohol use No  . Drug use: No  . Sexual activity: Not on file   Other Topics Concern  . Not on file   Social History Narrative  . No narrative on file  [place] where his father was in Pharmacy school. Move back to [place], [place]  and has lived there most of his life. 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 Mother 69 yo has had CVA;s and other medical problems. Father died in 4370's of emphysema. He has two sisters by adoption. Considered family Married on one contract with three children and 20 grandchildren all basically alive and well. Wife of 43 years has diabetes and had a GI bleed with anemia, non immune.  TOBACCO: Quit 11 years ago after smoking one half pack per day since  ALCOHOL: NA HIV RISK FACTORS: Denies TOXIN EXPOSURES: None. He was never in [employer] service.  FAMILY HISTORY: Family History  Problem Relation Age of Onset  . Dementia Mother   . Cancer Mother        colon  . COPD Father   . Ulcerative colitis Father   . Heart attack Father     Review of Systems  Constitutional: Negative.  Negative for chills and fever.  HENT: Negative.  Negative for hearing loss, sore throat and tinnitus.   Eyes: Negative.  Negative for blurred vision, photophobia and discharge.  Respiratory: Negative.  Negative for cough, hemoptysis, shortness of breath and wheezing.   Cardiovascular: Negative.  Negative for chest pain, palpitations, orthopnea, claudication and leg swelling.  Gastrointestinal: Negative.  Negative for abdominal pain, constipation, diarrhea, melena, nausea and vomiting.  Genitourinary: Negative.  Negative for dysuria and hematuria.  Musculoskeletal: Negative.  Negative for back pain, joint pain and myalgias.  Skin: Negative.  Negative for itching and rash.  Neurological: Negative.  Negative for dizziness, weakness and headaches.  Endo/Heme/Allergies: Negative.  Negative for environmental allergies and polydipsia. Does not bruise/bleed easily.  Psychiatric/Behavioral: Negative.  Negative for depression. The patient is not nervous/anxious and does not have insomnia.   All other systems reviewed and are negative. 14 point review of  systems was performed and is negative except as detailed under history of present illness and above   PHYSICAL EXAMINATION  ECOG PERFORMANCE STATUS: 0 - Asymptomatic  Vitals:   07/15/16 1353  BP: (!) 133/55  Pulse: 79  Resp: 18    Physical Exam  Constitutional: He is oriented to person, place, and time and well-developed, well-nourished, and in no distress. No distress.  HENT:  Head: Normocephalic and atraumatic.  Nose: Nose normal.  Mouth/Throat: Oropharynx is clear and moist. No oropharyngeal exudate.  Eyes: Conjunctivae and EOM are normal. Pupils are equal, round, and reactive to light. Right eye exhibits no discharge. Left eye exhibits no discharge. No scleral icterus.  Neck: Normal range of motion. Neck supple. No JVD present. No tracheal deviation present. No thyromegaly present.  Cardiovascular: Normal rate, regular rhythm and normal heart sounds.  Exam reveals no gallop and no friction rub.   No murmur heard. Pulmonary/Chest: Effort normal and breath sounds normal. No respiratory distress. He has no wheezes. He has no rales.  Abdominal: Soft. Bowel sounds are normal. He exhibits no distension and no mass. There is no tenderness. There is no rebound and no guarding.  Musculoskeletal: Normal range of motion. He exhibits no edema or tenderness.  Lymphadenopathy:    He has no cervical adenopathy.  Neurological: He is alert and oriented to person, place, and time. He has normal reflexes. No cranial nerve deficit. Gait normal. Coordination normal.  Skin: Skin is warm and dry. No rash noted. No erythema. No pallor.  Psychiatric: Mood, memory, affect and judgment normal.  Nursing note and vitals reviewed.  LABORATORY DATA: Data was reviewed as listed. CBC    Component Value Date/Time   WBC 6.9 07/15/2016 1320   RBC 3.77 (L) 07/15/2016 1320   RBC 3.77 (L) 07/15/2016 1320   HGB 12.5 (L) 07/15/2016 1320   HCT 37.9 (L) 07/15/2016 1320   PLT 198 07/15/2016 1320   MCV 100.5 (H)  07/15/2016 1320   MCH 33.2 07/15/2016 1320   MCHC 33.0 07/15/2016 1320   RDW 13.2 07/15/2016 1320   LYMPHSABS 1.2 07/15/2016 1320   MONOABS 0.6 07/15/2016 1320   EOSABS 0.2 07/15/2016 1320   BASOSABS 0.0 07/15/2016 1320    CMP     Component Value Date/Time   NA 139 07/15/2016 1320   K 3.9 07/15/2016 1320   CL 105 07/15/2016 1320   CO2 26 07/15/2016 1320   GLUCOSE 190 (H) 07/15/2016 1320   BUN 17 07/15/2016 1320   CREATININE 0.98 07/15/2016 1320   CALCIUM 8.9 07/15/2016 1320   PROT 6.6 07/15/2016 1320   ALBUMIN 3.9 07/15/2016 1320   AST 31 07/15/2016 1320   ALT 40 07/15/2016 1320   ALKPHOS 67 07/15/2016 1320   BILITOT 1.8 (H) 07/15/2016 1320   GFRNONAA >60 07/15/2016 1320   GFRAA >60 07/15/2016 1320   Results for YUSUF, YU (MRN [id number]) as of 03/04/2016 11:49  Ref. Range 09/15/2015 08:52 11/01/2015 14:23 11/15/2015 10:20  Vitamin B12 Latest Ref  Range: 180 - 914 pg/mL 3,042 (H) 2,616 (H) 2,934 (H)    Results for LADISLAUS, REPSHER (MRN [id number]) as of 03/04/2016 11:49  Ref. Range 12/06/2015 12:01 01/02/2016 09:16 01/23/2016 10:10 02/13/2016 10:33 03/04/2016 10:22  LDH Latest Ref Range: 98 - 192 U/L 206 (H) 224 (H) 241 (H) 209 (H) 243 (H)    Results for ZAYON, TRULSON (MRN [id number]) as of 03/04/2016 11:49  Ref. 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 He was responsive to steroids. He did receive Rituxan. It is  uncertain if this contributed to his remission.  Patient continues to have mild ongoing hemolysis. His hemoglobin is 12.5 g/dl today. I will bump his prednisone back up to 10 mg PO daily to see if this will improve his hemoglobin back up to the 13-14 g/dl range. He currently does not have any evidence of his hemolytic anemia being refractory to steroids. Will consider rituxan if he should become refractory in the future.   He also has pernicious anemia.He is only on SL B12. His B12 levels are high, continue to monitor. Some patients with pernicious anemia can be maintained on sublingual therapy without intramuscular therapy. I advised him, however, that this will have to be monitored and he is agreeable.  He will return for follow up in 2 months with labs.  Orders Placed This Encounter  Procedures  . CBC with Differential    Standing Status:   Future    Standing Expiration Date:   07/15/2017  . Comprehensive metabolic panel    Standing Status:   Future    Standing Expiration Date:   07/15/2017  .   All questions were answered. The patient knows to call the clinic with any problems, questions or concerns. We can certainly see the patient much sooner if necessary.  This document serves as a record of services personally performed by Loma Messing, MD. It was created on her behalf by Delana Meyer, a trained medical scribe. The creation of this record is based on the scribe's personal observations and the provider's statements to them. This document has been checked and approved by the attending provider.  I have reviewed the above documentation for accuracy and completeness and I agree with the above.  This note was electronically signed. Ralene Cork, MD  07/15/2016

## 2016-07-15 NOTE — Patient Instructions (Addendum)
[facility] at [facility] Discharge Instructions  RECOMMENDATIONS MADE BY THE CONSULTANT AND ANY TEST RESULTS WILL BE SENT TO YOUR REFERRING PHYSICIAN.  You were seen today by Dr. Janyth ContesZhou. Return in 2 months for labs and follow up.    Thank you for choosing [facility] at [facility] to provide your oncology and hematology care.  To afford each patient quality time with our provider, please arrive at least 15 minutes before your scheduled appointment time.    If you have a lab appointment with the Cancer Center please come in thru the  Main Entrance and check in at the main information desk  You need to re-schedule your appointment should you arrive 10 or more minutes late.  We strive to give you quality time with our providers, and arriving late affects you and other patients whose appointments are after yours.  Also, if you no show three or more times for appointments you may be dismissed from the clinic at the providers discretion.     Again, thank you for choosing [facility].  Our hope is that these requests will decrease the amount of time that you wait before being seen by our physicians.       _____________________________________________________________  Should you have questions after your visit to [facility], please contact our office at [phone] between the hours of 8:30 a.m. and 4:30 p.m.  Voicemails left after 4:30 p.m. will not be returned until the following business day.  For prescription refill requests, have your pharmacy contact our office.       Resources For Cancer Patients and their Caregivers ? American Cancer Society: Can assist with transportation, wigs, general needs, runs Look Good Feel Better.        [phone] ? Cancer Care: Provides financial assistance, online support groups, medication/co-pay assistance.  1-800-813-HOPE 409-600-7444(4673) ? [facility] Assists  [place] cancer patients and their families through emotional , educational and financial support.  [phone] ? [place] DSS Where to apply for food stamps, Medicaid and utility assistance. ([phone] [phone] ? [employer]: Transportation to medical appointments. [phone] [phone] ? Social Security Administration: May apply for disability if have a Stage IV cancer. 340 [phone] ? [place] [facility], Dis[place] and Transit Services: Assists with nutrition, care and transit needs. [phone]  Cancer Center Support Programs: @10RELATIVEDAYS @ > Cancer Support Group  2nd Tuesday of the month 1pm-2pm, Journey Room  > Creative Journey  3rd Tuesday of the month 1130am-1pm, Journey Room  > Look Good Feel Better  1st Wednesday of the month 10am-12 noon, Journey Room (Call American Cancer Society to register [phone])

## 2016-07-16 LAB — HAPTOGLOBIN: Haptoglobin: <10 mg/dL — ABNORMAL LOW (ref 34–200)

## 2016-08-29 ENCOUNTER — Encounter (HOSPITAL_BASED_OUTPATIENT_CLINIC_OR_DEPARTMENT_OTHER): Payer: Medicare Other | Attending: Internal Medicine

## 2016-08-29 DIAGNOSIS — Z794 Long term (current) use of insulin: Secondary | ICD-10-CM | POA: Diagnosis not present

## 2016-08-29 DIAGNOSIS — Y9301 Activity, walking, marching and hiking: Secondary | ICD-10-CM | POA: Insufficient documentation

## 2016-08-29 DIAGNOSIS — Z7952 Long term (current) use of systemic steroids: Secondary | ICD-10-CM | POA: Diagnosis not present

## 2016-08-29 DIAGNOSIS — Y92832 Beach as the place of occurrence of the external cause: Secondary | ICD-10-CM | POA: Diagnosis not present

## 2016-08-29 DIAGNOSIS — L97511 Non-pressure chronic ulcer of other part of right foot limited to breakdown of skin: Secondary | ICD-10-CM | POA: Diagnosis not present

## 2016-08-29 DIAGNOSIS — Z951 Presence of aortocoronary bypass graft: Secondary | ICD-10-CM | POA: Diagnosis not present

## 2016-08-29 DIAGNOSIS — L97521 Non-pressure chronic ulcer of other part of left foot limited to breakdown of skin: Secondary | ICD-10-CM | POA: Diagnosis not present

## 2016-08-29 DIAGNOSIS — E11621 Type 2 diabetes mellitus with foot ulcer: Secondary | ICD-10-CM | POA: Insufficient documentation

## 2016-08-29 DIAGNOSIS — I2510 Atherosclerotic heart disease of native coronary artery without angina pectoris: Secondary | ICD-10-CM | POA: Insufficient documentation

## 2016-08-29 DIAGNOSIS — E1140 Type 2 diabetes mellitus with diabetic neuropathy, unspecified: Secondary | ICD-10-CM | POA: Insufficient documentation

## 2016-08-29 DIAGNOSIS — T25221A Burn of second degree of right foot, initial encounter: Secondary | ICD-10-CM | POA: Diagnosis not present

## 2016-08-29 DIAGNOSIS — T25222A Burn of second degree of left foot, initial encounter: Secondary | ICD-10-CM | POA: Insufficient documentation

## 2016-09-06 DIAGNOSIS — E11621 Type 2 diabetes mellitus with foot ulcer: Secondary | ICD-10-CM | POA: Diagnosis not present

## 2016-09-13 ENCOUNTER — Encounter (HOSPITAL_BASED_OUTPATIENT_CLINIC_OR_DEPARTMENT_OTHER): Payer: Medicare Other | Attending: Internal Medicine

## 2016-09-13 DIAGNOSIS — I2510 Atherosclerotic heart disease of native coronary artery without angina pectoris: Secondary | ICD-10-CM | POA: Insufficient documentation

## 2016-09-13 DIAGNOSIS — E1140 Type 2 diabetes mellitus with diabetic neuropathy, unspecified: Secondary | ICD-10-CM | POA: Diagnosis not present

## 2016-09-13 DIAGNOSIS — E1142 Type 2 diabetes mellitus with diabetic polyneuropathy: Secondary | ICD-10-CM | POA: Diagnosis not present

## 2016-09-13 DIAGNOSIS — I10 Essential (primary) hypertension: Secondary | ICD-10-CM | POA: Diagnosis not present

## 2016-09-13 DIAGNOSIS — E11621 Type 2 diabetes mellitus with foot ulcer: Secondary | ICD-10-CM | POA: Insufficient documentation

## 2016-09-13 DIAGNOSIS — L97522 Non-pressure chronic ulcer of other part of left foot with fat layer exposed: Secondary | ICD-10-CM | POA: Diagnosis not present

## 2016-09-13 DIAGNOSIS — L97512 Non-pressure chronic ulcer of other part of right foot with fat layer exposed: Secondary | ICD-10-CM | POA: Diagnosis not present

## 2016-09-16 ENCOUNTER — Ambulatory Visit (HOSPITAL_COMMUNITY): Payer: Medicare Other

## 2016-09-16 ENCOUNTER — Other Ambulatory Visit (HOSPITAL_COMMUNITY): Payer: Medicare Other

## 2016-09-20 DIAGNOSIS — E11621 Type 2 diabetes mellitus with foot ulcer: Secondary | ICD-10-CM | POA: Diagnosis not present

## 2016-09-27 DIAGNOSIS — E11621 Type 2 diabetes mellitus with foot ulcer: Secondary | ICD-10-CM | POA: Diagnosis not present

## 2016-10-04 DIAGNOSIS — E11621 Type 2 diabetes mellitus with foot ulcer: Secondary | ICD-10-CM | POA: Diagnosis not present

## 2016-10-09 ENCOUNTER — Other Ambulatory Visit (HOSPITAL_COMMUNITY): Payer: Medicare Other

## 2016-10-09 ENCOUNTER — Ambulatory Visit (HOSPITAL_COMMUNITY): Payer: Medicare Other

## 2016-10-11 DIAGNOSIS — E11621 Type 2 diabetes mellitus with foot ulcer: Secondary | ICD-10-CM | POA: Diagnosis not present

## 2016-10-18 ENCOUNTER — Encounter (HOSPITAL_BASED_OUTPATIENT_CLINIC_OR_DEPARTMENT_OTHER): Payer: Medicare Other | Attending: Internal Medicine

## 2016-10-18 DIAGNOSIS — E11621 Type 2 diabetes mellitus with foot ulcer: Secondary | ICD-10-CM | POA: Diagnosis present

## 2016-10-18 DIAGNOSIS — L97512 Non-pressure chronic ulcer of other part of right foot with fat layer exposed: Secondary | ICD-10-CM | POA: Diagnosis not present

## 2016-10-18 DIAGNOSIS — L97522 Non-pressure chronic ulcer of other part of left foot with fat layer exposed: Secondary | ICD-10-CM | POA: Diagnosis not present

## 2016-10-18 DIAGNOSIS — I10 Essential (primary) hypertension: Secondary | ICD-10-CM | POA: Insufficient documentation

## 2016-10-18 DIAGNOSIS — E1140 Type 2 diabetes mellitus with diabetic neuropathy, unspecified: Secondary | ICD-10-CM | POA: Insufficient documentation

## 2016-10-18 DIAGNOSIS — E1142 Type 2 diabetes mellitus with diabetic polyneuropathy: Secondary | ICD-10-CM | POA: Insufficient documentation

## 2016-10-18 DIAGNOSIS — I2510 Atherosclerotic heart disease of native coronary artery without angina pectoris: Secondary | ICD-10-CM | POA: Diagnosis not present

## 2016-10-30 ENCOUNTER — Encounter (HOSPITAL_COMMUNITY): Payer: Medicare Other | Attending: Oncology | Admitting: Oncology

## 2016-10-30 ENCOUNTER — Encounter (HOSPITAL_COMMUNITY): Payer: Medicare Other

## 2016-10-30 ENCOUNTER — Encounter (HOSPITAL_COMMUNITY): Payer: Self-pay

## 2016-10-30 VITALS — BP 141/62 | HR 77 | Resp 16 | Ht 73.5 in | Wt 236.0 lb

## 2016-10-30 DIAGNOSIS — D510 Vitamin B12 deficiency anemia due to intrinsic factor deficiency: Secondary | ICD-10-CM | POA: Diagnosis not present

## 2016-10-30 DIAGNOSIS — D591 Autoimmune hemolytic anemia, unspecified: Secondary | ICD-10-CM

## 2016-10-30 DIAGNOSIS — R161 Splenomegaly, not elsewhere classified: Secondary | ICD-10-CM

## 2016-10-30 LAB — CBC WITH DIFFERENTIAL/PLATELET
Basophils Absolute: 0 10*3/uL (ref 0.0–0.1)
Basophils Relative: 0 %
EOS PCT: 1 %
Eosinophils Absolute: 0.1 10*3/uL (ref 0.0–0.7)
HEMATOCRIT: 41.6 % (ref 39.0–52.0)
Hemoglobin: 13.9 g/dL (ref 13.0–17.0)
LYMPHS ABS: 0.7 10*3/uL (ref 0.7–4.0)
LYMPHS PCT: 7 %
MCH: 31.3 pg (ref 26.0–34.0)
MCHC: 33.4 g/dL (ref 30.0–36.0)
MCV: 93.7 fL (ref 78.0–100.0)
Monocytes Absolute: 0.6 10*3/uL (ref 0.1–1.0)
Monocytes Relative: 5 %
NEUTROS ABS: 9.6 10*3/uL — AB (ref 1.7–7.7)
Neutrophils Relative %: 87 %
PLATELETS: 220 10*3/uL (ref 150–400)
RBC: 4.44 MIL/uL (ref 4.22–5.81)
RDW: 14.3 % (ref 11.5–15.5)
WBC: 11 10*3/uL — ABNORMAL HIGH (ref 4.0–10.5)

## 2016-10-30 LAB — COMPREHENSIVE METABOLIC PANEL
ALK PHOS: 72 U/L (ref 38–126)
ALT: 39 U/L (ref 17–63)
AST: 27 U/L (ref 15–41)
Albumin: 3.9 g/dL (ref 3.5–5.0)
Anion gap: 7 (ref 5–15)
BILIRUBIN TOTAL: 1.3 mg/dL — AB (ref 0.3–1.2)
BUN: 18 mg/dL (ref 6–20)
CALCIUM: 9.3 mg/dL (ref 8.9–10.3)
CHLORIDE: 102 mmol/L (ref 101–111)
CO2: 27 mmol/L (ref 22–32)
CREATININE: 1.05 mg/dL (ref 0.61–1.24)
GFR calc Af Amer: >60 mL/min (ref >60)
Glucose, Bld: 219 mg/dL — ABNORMAL HIGH (ref 65–99)
Potassium: 4.4 mmol/L (ref 3.5–5.1)
Sodium: 136 mmol/L (ref 135–145)
Total Protein: 6.9 g/dL (ref 6.5–8.1)
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
## 2016-11-01 DIAGNOSIS — E11621 Type 2 diabetes mellitus with foot ulcer: Secondary | ICD-10-CM | POA: Diagnosis not present

## 2016-11-08 DIAGNOSIS — E11621 Type 2 diabetes mellitus with foot ulcer: Secondary | ICD-10-CM | POA: Diagnosis not present

## 2016-11-22 ENCOUNTER — Encounter (HOSPITAL_BASED_OUTPATIENT_CLINIC_OR_DEPARTMENT_OTHER): Payer: Medicare Other | Attending: Internal Medicine

## 2016-11-22 DIAGNOSIS — L84 Corns and callosities: Secondary | ICD-10-CM | POA: Insufficient documentation

## 2016-11-22 DIAGNOSIS — L97522 Non-pressure chronic ulcer of other part of left foot with fat layer exposed: Secondary | ICD-10-CM | POA: Diagnosis not present

## 2016-11-22 DIAGNOSIS — E1142 Type 2 diabetes mellitus with diabetic polyneuropathy: Secondary | ICD-10-CM | POA: Insufficient documentation

## 2016-11-22 DIAGNOSIS — E11621 Type 2 diabetes mellitus with foot ulcer: Secondary | ICD-10-CM | POA: Diagnosis present

## 2016-11-29 ENCOUNTER — Other Ambulatory Visit (HOSPITAL_COMMUNITY): Payer: Self-pay | Admitting: Oncology

## 2016-11-29 DIAGNOSIS — E11621 Type 2 diabetes mellitus with foot ulcer: Secondary | ICD-10-CM | POA: Diagnosis not present

## 2016-11-29 DIAGNOSIS — D510 Vitamin B12 deficiency anemia due to intrinsic factor deficiency: Secondary | ICD-10-CM

## 2016-12-06 ENCOUNTER — Other Ambulatory Visit (HOSPITAL_COMMUNITY)
Admission: RE | Admit: 2016-12-06 | Discharge: 2016-12-06 | Disposition: A | Discharge status: Home / Self Care | Payer: Medicare Other | Source: Other Acute Inpatient Hospital | Attending: Internal Medicine | Admitting: Internal Medicine

## 2016-12-06 DIAGNOSIS — E11621 Type 2 diabetes mellitus with foot ulcer: Secondary | ICD-10-CM | POA: Diagnosis not present

## 2016-12-09 LAB — AEROBIC CULTURE  (SUPERFICIAL SPECIMEN)

## 2016-12-09 LAB — AEROBIC CULTURE W GRAM STAIN (SUPERFICIAL SPECIMEN)

## 2016-12-13 ENCOUNTER — Encounter (HOSPITAL_BASED_OUTPATIENT_CLINIC_OR_DEPARTMENT_OTHER): Payer: Medicare Other | Attending: Internal Medicine

## 2016-12-13 DIAGNOSIS — E1142 Type 2 diabetes mellitus with diabetic polyneuropathy: Secondary | ICD-10-CM | POA: Diagnosis not present

## 2016-12-13 DIAGNOSIS — E11621 Type 2 diabetes mellitus with foot ulcer: Secondary | ICD-10-CM | POA: Insufficient documentation

## 2016-12-13 DIAGNOSIS — B9620 Unspecified Escherichia coli [E. coli] as the cause of diseases classified elsewhere: Secondary | ICD-10-CM | POA: Diagnosis not present

## 2016-12-13 DIAGNOSIS — L97522 Non-pressure chronic ulcer of other part of left foot with fat layer exposed: Secondary | ICD-10-CM | POA: Diagnosis not present

## 2016-12-13 DIAGNOSIS — B952 Enterococcus as the cause of diseases classified elsewhere: Secondary | ICD-10-CM | POA: Diagnosis not present

## 2016-12-20 DIAGNOSIS — E11621 Type 2 diabetes mellitus with foot ulcer: Secondary | ICD-10-CM | POA: Diagnosis not present

## 2016-12-27 DIAGNOSIS — E11621 Type 2 diabetes mellitus with foot ulcer: Secondary | ICD-10-CM | POA: Diagnosis not present

## 2017-01-10 DIAGNOSIS — E11621 Type 2 diabetes mellitus with foot ulcer: Secondary | ICD-10-CM | POA: Diagnosis not present

## 2017-01-13 ENCOUNTER — Encounter (HOSPITAL_BASED_OUTPATIENT_CLINIC_OR_DEPARTMENT_OTHER): Payer: Medicare Other | Attending: Internal Medicine

## 2017-01-13 DIAGNOSIS — T25221D Burn of second degree of right foot, subsequent encounter: Secondary | ICD-10-CM | POA: Insufficient documentation

## 2017-01-13 DIAGNOSIS — L97522 Non-pressure chronic ulcer of other part of left foot with fat layer exposed: Secondary | ICD-10-CM | POA: Diagnosis not present

## 2017-01-13 DIAGNOSIS — E1142 Type 2 diabetes mellitus with diabetic polyneuropathy: Secondary | ICD-10-CM | POA: Diagnosis not present

## 2017-01-13 DIAGNOSIS — I2510 Atherosclerotic heart disease of native coronary artery without angina pectoris: Secondary | ICD-10-CM | POA: Diagnosis not present

## 2017-01-13 DIAGNOSIS — Z794 Long term (current) use of insulin: Secondary | ICD-10-CM | POA: Insufficient documentation

## 2017-01-13 DIAGNOSIS — T25222D Burn of second degree of left foot, subsequent encounter: Secondary | ICD-10-CM | POA: Insufficient documentation

## 2017-01-13 DIAGNOSIS — I10 Essential (primary) hypertension: Secondary | ICD-10-CM | POA: Insufficient documentation

## 2017-01-13 DIAGNOSIS — X58XXXD Exposure to other specified factors, subsequent encounter: Secondary | ICD-10-CM | POA: Insufficient documentation

## 2017-01-13 DIAGNOSIS — D649 Anemia, unspecified: Secondary | ICD-10-CM | POA: Insufficient documentation

## 2017-01-13 DIAGNOSIS — E11621 Type 2 diabetes mellitus with foot ulcer: Secondary | ICD-10-CM | POA: Diagnosis not present

## 2017-01-13 DIAGNOSIS — L97512 Non-pressure chronic ulcer of other part of right foot with fat layer exposed: Secondary | ICD-10-CM | POA: Insufficient documentation

## 2017-01-17 DIAGNOSIS — E11621 Type 2 diabetes mellitus with foot ulcer: Secondary | ICD-10-CM | POA: Diagnosis not present

## 2017-01-24 DIAGNOSIS — E11621 Type 2 diabetes mellitus with foot ulcer: Secondary | ICD-10-CM | POA: Diagnosis not present

## 2017-01-31 DIAGNOSIS — E11621 Type 2 diabetes mellitus with foot ulcer: Secondary | ICD-10-CM | POA: Diagnosis not present

## 2017-02-07 DIAGNOSIS — E11621 Type 2 diabetes mellitus with foot ulcer: Secondary | ICD-10-CM | POA: Diagnosis not present

## 2017-02-14 ENCOUNTER — Encounter (HOSPITAL_BASED_OUTPATIENT_CLINIC_OR_DEPARTMENT_OTHER): Payer: Medicare Other | Attending: Internal Medicine

## 2017-02-14 DIAGNOSIS — L97522 Non-pressure chronic ulcer of other part of left foot with fat layer exposed: Secondary | ICD-10-CM | POA: Diagnosis not present

## 2017-02-14 DIAGNOSIS — E1142 Type 2 diabetes mellitus with diabetic polyneuropathy: Secondary | ICD-10-CM | POA: Insufficient documentation

## 2017-02-14 DIAGNOSIS — E11621 Type 2 diabetes mellitus with foot ulcer: Secondary | ICD-10-CM | POA: Insufficient documentation

## 2017-02-14 DIAGNOSIS — E1140 Type 2 diabetes mellitus with diabetic neuropathy, unspecified: Secondary | ICD-10-CM | POA: Insufficient documentation

## 2017-02-14 DIAGNOSIS — I2510 Atherosclerotic heart disease of native coronary artery without angina pectoris: Secondary | ICD-10-CM | POA: Insufficient documentation

## 2017-02-14 DIAGNOSIS — I10 Essential (primary) hypertension: Secondary | ICD-10-CM | POA: Diagnosis not present

## 2017-02-14 DIAGNOSIS — L97529 Non-pressure chronic ulcer of other part of left foot with unspecified severity: Secondary | ICD-10-CM | POA: Diagnosis not present

## 2017-02-21 DIAGNOSIS — E11621 Type 2 diabetes mellitus with foot ulcer: Secondary | ICD-10-CM | POA: Diagnosis not present

## 2017-02-24 ENCOUNTER — Inpatient Hospital Stay (HOSPITAL_COMMUNITY): Payer: Medicare Other | Attending: Oncology

## 2017-02-24 DIAGNOSIS — Z7982 Long term (current) use of aspirin: Secondary | ICD-10-CM | POA: Diagnosis not present

## 2017-02-24 DIAGNOSIS — I2510 Atherosclerotic heart disease of native coronary artery without angina pectoris: Secondary | ICD-10-CM | POA: Insufficient documentation

## 2017-02-24 DIAGNOSIS — E785 Hyperlipidemia, unspecified: Secondary | ICD-10-CM | POA: Insufficient documentation

## 2017-02-24 DIAGNOSIS — Z951 Presence of aortocoronary bypass graft: Secondary | ICD-10-CM | POA: Diagnosis not present

## 2017-02-24 DIAGNOSIS — D591 Autoimmune hemolytic anemia, unspecified: Secondary | ICD-10-CM

## 2017-02-24 DIAGNOSIS — Z79899 Other long term (current) drug therapy: Secondary | ICD-10-CM | POA: Diagnosis not present

## 2017-02-24 DIAGNOSIS — I10 Essential (primary) hypertension: Secondary | ICD-10-CM | POA: Diagnosis not present

## 2017-02-24 DIAGNOSIS — Z87891 Personal history of nicotine dependence: Secondary | ICD-10-CM | POA: Insufficient documentation

## 2017-02-24 LAB — COMPREHENSIVE METABOLIC PANEL
ALBUMIN: 3.8 g/dL (ref 3.5–5.0)
ALT: 33 U/L (ref 17–63)
AST: 26 U/L (ref 15–41)
Alkaline Phosphatase: 73 U/L (ref 38–126)
Anion gap: 9 (ref 5–15)
BUN: 19 mg/dL (ref 6–20)
CHLORIDE: 102 mmol/L (ref 101–111)
CO2: 28 mmol/L (ref 22–32)
Calcium: 9.3 mg/dL (ref 8.9–10.3)
Creatinine, Ser: 1.11 mg/dL (ref 0.61–1.24)
GFR calc Af Amer: >60 mL/min (ref >60)
GFR calc non Af Amer: >60 mL/min (ref >60)
GLUCOSE: 165 mg/dL — AB (ref 65–99)
POTASSIUM: 4.1 mmol/L (ref 3.5–5.1)
SODIUM: 139 mmol/L (ref 135–145)
Total Bilirubin: 1.5 mg/dL — ABNORMAL HIGH (ref 0.3–1.2)
Total Protein: 6.9 g/dL (ref 6.5–8.1)

## 2017-02-24 LAB — CBC WITH DIFFERENTIAL/PLATELET
BASOS ABS: 0 10*3/uL (ref 0.0–0.1)
BASOS PCT: 0 %
EOS ABS: 0.2 10*3/uL (ref 0.0–0.7)
EOS PCT: 1 %
HCT: 41.2 % (ref 39.0–52.0)
Hemoglobin: 13.3 g/dL (ref 13.0–17.0)
Lymphocytes Relative: 8 %
Lymphs Abs: 0.9 10*3/uL (ref 0.7–4.0)
MCH: 32.3 pg (ref 26.0–34.0)
MCHC: 32.3 g/dL (ref 30.0–36.0)
MCV: 100 fL (ref 78.0–100.0)
MONO ABS: 0.7 10*3/uL (ref 0.1–1.0)
Monocytes Relative: 6 %
Neutro Abs: 10.2 10*3/uL — ABNORMAL HIGH (ref 1.7–7.7)
Neutrophils Relative %: 85 %
PLATELETS: 235 10*3/uL (ref 150–400)
RBC: 4.12 MIL/uL — AB (ref 4.22–5.81)
RDW: 13.4 % (ref 11.5–15.5)
WBC: 12.1 10*3/uL — AB (ref 4.0–10.5)

## 2017-02-24 LAB — VITAMIN B12: VITAMIN B 12: 972 pg/mL — AB (ref 180–914)

## 2017-02-28 DIAGNOSIS — E11621 Type 2 diabetes mellitus with foot ulcer: Secondary | ICD-10-CM | POA: Diagnosis not present

## 2017-03-03 ENCOUNTER — Ambulatory Visit (HOSPITAL_COMMUNITY): Payer: Medicare Other | Admitting: Oncology

## 2017-03-07 DIAGNOSIS — E11621 Type 2 diabetes mellitus with foot ulcer: Secondary | ICD-10-CM | POA: Diagnosis not present

## 2017-03-08 ENCOUNTER — Other Ambulatory Visit (HOSPITAL_COMMUNITY): Payer: Self-pay | Admitting: Adult Health

## 2017-03-08 DIAGNOSIS — D510 Vitamin B12 deficiency anemia due to intrinsic factor deficiency: Secondary | ICD-10-CM

## 2017-03-10 ENCOUNTER — Other Ambulatory Visit (HOSPITAL_COMMUNITY): Payer: Self-pay | Admitting: Emergency Medicine

## 2017-03-10 DIAGNOSIS — D510 Vitamin B12 deficiency anemia due to intrinsic factor deficiency: Secondary | ICD-10-CM

## 2017-03-10 MED ORDER — PREDNISONE 10 MG PO TABS: 10.0000 mg | ORAL_TABLET | ORAL | 1 refills | Status: DC

## 2017-03-10 NOTE — Progress Notes (Signed)
Prednisone refilled

## 2017-03-12 ENCOUNTER — Inpatient Hospital Stay (HOSPITAL_BASED_OUTPATIENT_CLINIC_OR_DEPARTMENT_OTHER): Payer: Medicare Other | Admitting: Internal Medicine

## 2017-03-12 ENCOUNTER — Encounter (HOSPITAL_COMMUNITY): Payer: Self-pay | Admitting: Internal Medicine

## 2017-03-12 ENCOUNTER — Other Ambulatory Visit: Payer: Self-pay

## 2017-03-12 VITALS — BP 141/67 | HR 75 | Temp 97.8°F | Resp 20 | Ht 73.5 in | Wt 240.3 lb

## 2017-03-12 DIAGNOSIS — I10 Essential (primary) hypertension: Secondary | ICD-10-CM | POA: Diagnosis not present

## 2017-03-12 DIAGNOSIS — D591 Autoimmune hemolytic anemia, unspecified: Secondary | ICD-10-CM

## 2017-03-12 DIAGNOSIS — Z79899 Other long term (current) drug therapy: Secondary | ICD-10-CM | POA: Diagnosis not present

## 2017-03-12 NOTE — Patient Instructions (Addendum)
[facility] at [facility] Discharge Instructions  RECOMMENDATIONS MADE BY THE CONSULTANT AND ANY TEST RESULTS WILL BE SENT TO YOUR REFERRING PHYSICIAN.  Labs from 02/24/17 were reviewed today in clinic. Labs were normal.   Prednisone 10mg  to continue daily.   If you are interested in speaking with a urologist about testosterone treatments - we can refer you to one. Just let us know if/when you are interested.   Return in 4 months to see MD with labs. If you need us before then, please call us and schedule an appointment.    Thank you for choosing [facility] at [facility] to provide your oncology and hematology care.  To afford each patient quality time with our provider, please arrive at least 15 minutes before your scheduled appointment time.    If you have a lab appointment with the Cancer Center please come in thru the  Main Entrance and check in at the main information desk  You need to re-schedule your appointment should you arrive 10 or more minutes late.  We strive to give you quality time with our providers, and arriving late affects you and other patients whose appointments are after yours.  Also, if you no show three or more times for appointments you may be dismissed from the clinic at the providers discretion.     Again, thank you for choosing [facility].  Our hope is that these requests will decrease the amount of time that you wait before being seen by our physicians.       _____________________________________________________________  Should you have questions after your visit to [facility], please contact our office at [phone] between the hours of 8:30 a.m. and 4:30 p.m.  Voicemails left after 4:30 p.m. will not be returned until the following business day.  For prescription refill requests, have your pharmacy contact our office.       Resources For Cancer Patients and their  Caregivers ? American Cancer Society: Can assist with transportation, wigs, general needs, runs Look Good Feel Better.        [phone] ? Cancer Care: Provides financial assistance, online support groups, medication/co-pay assistance.  1-800-813-HOPE 519-756-6938(4673) ? [facility] Assists [place] cancer patients and their families through emotional , educational and financial support.  [phone] ? [place] DSS Where to apply for food stamps, Medicaid and utility assistance. [phone]([phone] ? [employer]: Transportation to medical appointments. [phone] ? Social Security Administration: May apply for disability if have a Stage IV cancer. [phone] ? [place] [facility], Dis[facility] and Transit Services: Assists with nutrition, care and transit needs. [phone]  Cancer Center Support Programs: @10RELATIVEDAYS @ > Cancer Support Group  2nd Tuesday of the month 1pm-2pm, Journey Room  > Creative Journey  3rd Tuesday of the month 1130am-1pm, Journey Room  > Look Good Feel Better  1st Wednesday of the month 10am-12 noon, Journey Room (Call American Cancer Society to register [phone])

## 2017-03-14 ENCOUNTER — Encounter (HOSPITAL_BASED_OUTPATIENT_CLINIC_OR_DEPARTMENT_OTHER): Payer: Medicare Other | Attending: Internal Medicine

## 2017-03-14 DIAGNOSIS — E1140 Type 2 diabetes mellitus with diabetic neuropathy, unspecified: Secondary | ICD-10-CM | POA: Diagnosis not present

## 2017-03-14 DIAGNOSIS — E11621 Type 2 diabetes mellitus with foot ulcer: Secondary | ICD-10-CM | POA: Insufficient documentation

## 2017-03-14 DIAGNOSIS — I2510 Atherosclerotic heart disease of native coronary artery without angina pectoris: Secondary | ICD-10-CM | POA: Diagnosis not present

## 2017-03-14 DIAGNOSIS — E1142 Type 2 diabetes mellitus with diabetic polyneuropathy: Secondary | ICD-10-CM | POA: Diagnosis not present

## 2017-03-14 DIAGNOSIS — L97522 Non-pressure chronic ulcer of other part of left foot with fat layer exposed: Secondary | ICD-10-CM | POA: Insufficient documentation

## 2017-03-14 DIAGNOSIS — I10 Essential (primary) hypertension: Secondary | ICD-10-CM | POA: Insufficient documentation

## 2017-03-14 NOTE — Progress Notes (Signed)
Diagnosis Autoimmune hemolytic anemia (HCC) - Plan: CBC with Differential, Comprehensive metabolic panel, Lactate dehydrogenase  Staging Cancer Staging No matching staging information was found for the patient.  Assessment and Plan: 1.  Autoimmune hemolytic anemia, IgG Warm Reactive.  On 09/2012 Positive Coombs, increased retic, LDH, low haptogobin.  He was treated with Rituxan x4 in June 2015.  The patient is currently on prednisone 10 mg daily.  White count is 12 hemoglobin 13.2 platelets 235,000.  He reports in the past he had been treated with prednisone every other day but his dose was increased due to worsening anemia.  I have discussed with him he would have the option of trying to go back to every other day but with the understanding that his blood count may decrease with change in therapy.  He is willing to continue his current dose and will be set up for repeat labs and follow-up.  2.  Pernicious anemia.  The patient remains on B12.  Hemoglobin is 13.2.  3.  Hypertension.  Blood pressure is 141/67.  Follow-up with primary care physician.  4.  DM/Wound.  Follow-up with primary care physician for ongoing management.    CURRENT THERAPY: prednisone 10 mg po qod  INTERVAL HISTORY: [name] Page 72 y.o. male with hemolytic anemia, history of positive coombs. Currently he is on prednisone 10 mg qod.  Pt had consultation at [facility] in the past. Has been vaccinated in preparation for splenectomy but reports surgery was not recommended.  Current status.  He is seen today for follow-up.  He reports a wound on his foot which he feels is due to neuropathy and chronic steroid use.  He previously had been on prednisone every other day but reports the prednisone dose has been increased to daily.     Problem List Patient Active Problem List   Diagnosis Date Noted  . Cold [J00] 02/13/2016  . Autoimmune hemolytic anemia ([place]) [D59.1] 10/18/2015  . Pernicious anemia [D51.0] 10/18/2015  .  Neuropathy [G62.9] 03/01/2015  . Lung nodule, solitary [R91.1] 12/06/2014  . Splenomegaly [R16.1] 11/01/2013  . CORONARY ATHEROSCLEROSIS NATIVE CORONARY ARTERY [I25.10] 07/07/2009  . HYPERLIPIDEMIA-MIXED [E78.5] 11/28/2008  . Essential hypertension [I10] 11/28/2008    Past Medical History Past Medical History:  Diagnosis Date  . Coronary atherosclerosis of native coronary artery    Multivessel, LVEF 59%  . Essential hypertension, benign   . Hemolytic anemia ([place])   . Hemolytic anemia due to warm antibody ([place]) 10/18/2015  . Hyperlipidemia   . Pernicious anemia     Past Surgical History Past Surgical History:  Procedure Laterality Date  . CORONARY ARTERY BYPASS GRAFT  2004   LIMA to LAD, SVG to OM, SVG to PDA    Family History Family History  Problem Relation Age of Onset  . Dementia Mother   . Cancer Mother        colon  . COPD Father   . Ulcerative colitis Father   . Heart attack Father      Social History  reports that he quit smoking about 15 years ago. His smoking use included cigarettes. he has never used smokeless tobacco. He reports that he does not drink alcohol or use drugs.  Medications  Current Outpatient Medications:  .  aspirin EC 81 MG tablet, Take 81 mg by mouth daily., Disp: , Rfl:  .  Aspirin-Acetaminophen-Caffeine (GOODYS EXTRA STRENGTH) 500-325-65 MG PACK, Take by mouth., Disp: , Rfl:  .  b complex vitamins tablet, Take 1 tablet by  mouth daily., Disp: , Rfl:  .  calcium carbonate (OS-CAL) 600 MG TABS tablet, Take 600 mg by mouth daily with breakfast., Disp: , Rfl:  .  calcium citrate-vitamin D (CITRACAL+D) 315-200 MG-UNIT tablet, Take by mouth., Disp: , Rfl:  .  Cranberry 500 MG CAPS, Take by mouth., Disp: , Rfl:  .  Fish Oil-Cholecalciferol (FISH OIL + D3 PO), Take 1 capsule by mouth daily., Disp: , Rfl:  .  Flaxseed, Linseed, (FLAX SEED OIL PO), Take 1 capsule by mouth daily., Disp: , Rfl:  .  folic acid (FOLVITE) 1 MG tablet, Take 1 mg by mouth  daily., Disp: , Rfl:  .  GARLIC OIL PO, Take 1 capsule by mouth daily., Disp: , Rfl:  .  glipiZIDE (GLUCOTROL) 5 MG tablet, Take 5 mg by mouth daily. , Disp: , Rfl:  .  insulin aspart (NOVOLOG) 100 UNIT/ML injection, Inject 0-6 Units into the skin as needed. , Disp: , Rfl:  .  levothyroxine (SYNTHROID, LEVOTHROID) 50 MCG tablet, Take 50 mcg by mouth daily., Disp: , Rfl:  .  Magnesium 250 MG TABS, Take 1 tablet by mouth daily., Disp: , Rfl:  .  metFORMIN (GLUCOPHAGE) 500 MG tablet, Take 500 mg by mouth daily with breakfast., Disp: , Rfl:  .  metoprolol succinate (TOPROL-XL) 25 MG 24 hr tablet, Take 25 mg by mouth daily.  , Disp: , Rfl:  .  Misc Natural Products (PROSTATE CONTROL PO), Take 1 tablet by mouth daily., Disp: , Rfl:  .  Multiple Vitamin (MULTIVITAMIN) tablet, Take 1 tablet by mouth daily., Disp: , Rfl:  .  Multiple Vitamins-Minerals (MULTI FOR HIM 50+) TABS, Take by mouth., Disp: , Rfl:  .  Omega 3-6-9 CAPS, Take by mouth., Disp: , Rfl:  .  POTASSIUM PO, Take by mouth., Disp: , Rfl:  .  predniSONE (DELTASONE) 10 MG tablet, Take 1 tablet (10 mg total) by mouth every other day. 2m by mouth every day. (Patient taking differently: Take 10 mg by mouth daily. 129mby mouth every day.), Disp: 100 tablet, Rfl: 1 .  psyllium (METAMUCIL) 58.6 % powder, Take 1 packet by mouth daily. , Disp: , Rfl:  .  Red Yeast Rice 600 MG TABS, Take by mouth., Disp: , Rfl:  .  Saw Palmetto 160 MG CAPS, Take by mouth., Disp: , Rfl:  .  vitamin B-12 (CYANOCOBALAMIN) 1000 MCG tablet, Take 1,000 mcg by mouth daily., Disp: , Rfl:  .  Zn-Pyg Afri-Nettle-Saw Palmet (SAW PALMETTO COMPLEX PO), Take 2 capsules by mouth 2 (two) times daily., Disp: , Rfl:   Allergies Patient has no known allergies.  Review of Systems Review of Systems - Oncology ROS as per HPI otherwise 12 point ROS is negative.   Physical Exam  Vitals Wt Readings from Last 3 Encounters:  03/12/17 240 lb 4.8 oz (109 kg)  10/30/16 236 lb (107  kg)  07/15/16 236 lb (107 kg)   Temp Readings from Last 3 Encounters:  03/12/17 97.8 F (36.6 C) (Oral)  05/06/16 97.8 F (36.6 C) (Oral)  03/04/16 98 F (36.7 C) (Oral)   BP Readings from Last 3 Encounters:  03/12/17 (!) 141/67  10/30/16 (!) 141/62  07/15/16 (!) 133/55   Pulse Readings from Last 3 Encounters:  03/12/17 75  10/30/16 77  07/15/16 79   Constitutional: Well-developed, well-nourished, and in no distress.   HENT:  Head: Normocephalic and atraumatic.  Mouth/Throat: No oropharyngeal exudate. Mucosa moist. Eyes: Pupils are equal, round, and reactive to  light. Conjunctivae are normal. No scleral icterus.  Neck: Normal range of motion. Neck supple. No JVD present.  Cardiovascular: Normal rate, regular rhythm and normal heart sounds.  Exam reveals no gallop and no friction rub.   No murmur heard. Pulmonary/Chest: Effort normal and breath sounds normal. No respiratory distress. No wheezes.No rales.  Abdominal: Soft. Bowel sounds are normal. No distension. There is no tenderness. There is no guarding.  Musculoskeletal: No edema or tenderness.  Soft shoe noted on  foot Lymphadenopathy: No cervical, axillary or supraclavicular adenopathy.  Neurological: Alert and oriented to person, place, and time. No cranial nerve deficit.  Skin: Skin is warm and dry. No rash noted. No erythema. No pallor.  Psychiatric: Affect and judgment normal.   Labs No visits with results within 3 Day(s) from this visit.  Latest known visit with results is:  Appointment on 02/24/2017  Component Date Value Ref Range Status  . WBC 02/24/2017 12.1* 4.0 - 10.5 K/uL Final  . RBC 02/24/2017 4.12* 4.22 - 5.81 MIL/uL Final  . Hemoglobin 02/24/2017 13.3  13.0 - 17.0 g/dL Final  . HCT 02/24/2017 41.2  39.0 - 52.0 % Final  . MCV 02/24/2017 100.0  78.0 - 100.0 fL Final  . MCH 02/24/2017 32.3  26.0 - 34.0 pg Final  . MCHC 02/24/2017 32.3  30.0 - 36.0 g/dL Final  . RDW 02/24/2017 13.4  11.5 - 15.5 % Final   . Platelets 02/24/2017 235  150 - 400 K/uL Final  . Neutrophils Relative % 02/24/2017 85  % Final  . Neutro Abs 02/24/2017 10.2* 1.7 - 7.7 K/uL Final  . Lymphocytes Relative 02/24/2017 8  % Final  . Lymphs Abs 02/24/2017 0.9  0.7 - 4.0 K/uL Final  . Monocytes Relative 02/24/2017 6  % Final  . Monocytes Absolute 02/24/2017 0.7  0.1 - 1.0 K/uL Final  . Eosinophils Relative 02/24/2017 1  % Final  . Eosinophils Absolute 02/24/2017 0.2  0.0 - 0.7 K/uL Final  . Basophils Relative 02/24/2017 0  % Final  . Basophils Absolute 02/24/2017 0.0  0.0 - 0.1 K/uL Final  . Sodium 02/24/2017 139  135 - 145 mmol/L Final  . Potassium 02/24/2017 4.1  3.5 - 5.1 mmol/L Final  . Chloride 02/24/2017 102  101 - 111 mmol/L Final  . CO2 02/24/2017 28  22 - 32 mmol/L Final  . Glucose, Bld 02/24/2017 165* 65 - 99 mg/dL Final  . BUN 02/24/2017 19  6 - 20 mg/dL Final  . Creatinine, Ser 02/24/2017 1.11  0.61 - 1.24 mg/dL Final  . Calcium 02/24/2017 9.3  8.9 - 10.3 mg/dL Final  . Total Protein 02/24/2017 6.9  6.5 - 8.1 g/dL Final  . Albumin 02/24/2017 3.8  3.5 - 5.0 g/dL Final  . AST 02/24/2017 26  15 - 41 U/L Final  . ALT 02/24/2017 33  17 - 63 U/L Final  . Alkaline Phosphatase 02/24/2017 73  38 - 126 U/L Final  . Total Bilirubin 02/24/2017 1.5* 0.3 - 1.2 mg/dL Final  . GFR calc non Af Amer 02/24/2017 >60  >60 mL/min Final  . GFR calc Af Amer 02/24/2017 >60  >60 mL/min Final   Comment: (NOTE) The eGFR has been calculated using the CKD EPI equation. This calculation has not been validated in all clinical situations. eGFR's persistently <60 mL/min signify possible Chronic Kidney Disease.   . Anion gap 02/24/2017 9  5 - 15 Final  . Vitamin B-12 02/24/2017 972* 180 - 914 pg/mL Final  Comment: (NOTE) This assay is not validated for testing neonatal or myeloproliferative syndrome specimens for Vitamin B12 levels. Performed at [facility], [place] [address] [place], [place] [address]       Pathology Orders Placed This Encounter  Procedures  . CBC with Differential    Standing Status:   Future    Standing Expiration Date:   03/12/2018  . Comprehensive metabolic panel    Standing Status:   Future    Standing Expiration Date:   03/12/2018  . Lactate dehydrogenase    Standing Status:   Future    Standing Expiration Date:   03/12/2018       Zoila Shutter MD

## 2017-03-21 DIAGNOSIS — E11621 Type 2 diabetes mellitus with foot ulcer: Secondary | ICD-10-CM | POA: Diagnosis not present

## 2017-04-04 DIAGNOSIS — E11621 Type 2 diabetes mellitus with foot ulcer: Secondary | ICD-10-CM | POA: Diagnosis not present

## 2017-04-11 ENCOUNTER — Encounter (HOSPITAL_BASED_OUTPATIENT_CLINIC_OR_DEPARTMENT_OTHER): Payer: Medicare Other | Attending: Internal Medicine

## 2017-04-11 DIAGNOSIS — D591 Other autoimmune hemolytic anemias: Secondary | ICD-10-CM | POA: Insufficient documentation

## 2017-04-11 DIAGNOSIS — E1142 Type 2 diabetes mellitus with diabetic polyneuropathy: Secondary | ICD-10-CM | POA: Diagnosis not present

## 2017-04-11 DIAGNOSIS — E11621 Type 2 diabetes mellitus with foot ulcer: Secondary | ICD-10-CM | POA: Insufficient documentation

## 2017-04-11 DIAGNOSIS — Z7952 Long term (current) use of systemic steroids: Secondary | ICD-10-CM | POA: Diagnosis not present

## 2017-04-11 DIAGNOSIS — I2510 Atherosclerotic heart disease of native coronary artery without angina pectoris: Secondary | ICD-10-CM | POA: Diagnosis not present

## 2017-04-11 DIAGNOSIS — I10 Essential (primary) hypertension: Secondary | ICD-10-CM | POA: Insufficient documentation

## 2017-04-11 DIAGNOSIS — L97522 Non-pressure chronic ulcer of other part of left foot with fat layer exposed: Secondary | ICD-10-CM | POA: Diagnosis not present

## 2017-04-11 DIAGNOSIS — L97529 Non-pressure chronic ulcer of other part of left foot with unspecified severity: Secondary | ICD-10-CM | POA: Insufficient documentation

## 2017-04-18 DIAGNOSIS — E11621 Type 2 diabetes mellitus with foot ulcer: Secondary | ICD-10-CM | POA: Diagnosis not present

## 2017-04-25 ENCOUNTER — Other Ambulatory Visit (HOSPITAL_COMMUNITY): Payer: Self-pay | Admitting: Adult Health

## 2017-04-25 DIAGNOSIS — D510 Vitamin B12 deficiency anemia due to intrinsic factor deficiency: Secondary | ICD-10-CM

## 2017-04-25 DIAGNOSIS — E11621 Type 2 diabetes mellitus with foot ulcer: Secondary | ICD-10-CM | POA: Diagnosis not present

## 2017-05-02 DIAGNOSIS — E11621 Type 2 diabetes mellitus with foot ulcer: Secondary | ICD-10-CM | POA: Diagnosis not present
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
## 2017-05-07 ENCOUNTER — Ambulatory Visit (INDEPENDENT_AMBULATORY_CARE_PROVIDER_SITE_OTHER): Payer: Medicare Other | Admitting: Cardiology

## 2017-05-07 ENCOUNTER — Other Ambulatory Visit (HOSPITAL_COMMUNITY): Payer: Self-pay | Admitting: Adult Health

## 2017-05-07 ENCOUNTER — Encounter: Payer: Self-pay | Admitting: Cardiology

## 2017-05-07 VITALS — BP 138/78 | HR 71 | Ht 73.0 in | Wt 240.0 lb

## 2017-05-07 DIAGNOSIS — Z789 Other specified health status: Secondary | ICD-10-CM

## 2017-05-07 DIAGNOSIS — I25119 Atherosclerotic heart disease of native coronary artery with unspecified angina pectoris: Secondary | ICD-10-CM | POA: Diagnosis not present

## 2017-05-07 DIAGNOSIS — D510 Vitamin B12 deficiency anemia due to intrinsic factor deficiency: Secondary | ICD-10-CM

## 2017-05-07 DIAGNOSIS — E782 Mixed hyperlipidemia: Secondary | ICD-10-CM | POA: Diagnosis not present

## 2017-05-07 DIAGNOSIS — I10 Essential (primary) hypertension: Secondary | ICD-10-CM

## 2017-05-07 NOTE — Patient Instructions (Signed)

## 2017-05-09 DIAGNOSIS — E11621 Type 2 diabetes mellitus with foot ulcer: Secondary | ICD-10-CM | POA: Diagnosis not present

## 2017-05-19 ENCOUNTER — Encounter (HOSPITAL_BASED_OUTPATIENT_CLINIC_OR_DEPARTMENT_OTHER): Payer: Medicare Other

## 2017-05-19 DIAGNOSIS — I10 Essential (primary) hypertension: Secondary | ICD-10-CM | POA: Insufficient documentation

## 2017-05-19 DIAGNOSIS — I2510 Atherosclerotic heart disease of native coronary artery without angina pectoris: Secondary | ICD-10-CM | POA: Diagnosis not present

## 2017-05-19 DIAGNOSIS — Z09 Encounter for follow-up examination after completed treatment for conditions other than malignant neoplasm: Secondary | ICD-10-CM | POA: Insufficient documentation

## 2017-05-19 DIAGNOSIS — Z8631 Personal history of diabetic foot ulcer: Secondary | ICD-10-CM | POA: Insufficient documentation

## 2017-05-19 DIAGNOSIS — E1142 Type 2 diabetes mellitus with diabetic polyneuropathy: Secondary | ICD-10-CM | POA: Insufficient documentation

## 2017-06-04 ENCOUNTER — Other Ambulatory Visit (HOSPITAL_COMMUNITY): Payer: Self-pay | Admitting: Adult Health

## 2017-06-04 DIAGNOSIS — D510 Vitamin B12 deficiency anemia due to intrinsic factor deficiency: Secondary | ICD-10-CM

## 2017-06-05 NOTE — Telephone Encounter (Signed)
Just being sure you received this.. 

## 2017-07-09 ENCOUNTER — Other Ambulatory Visit (HOSPITAL_COMMUNITY): Payer: Medicare Other

## 2017-07-09 ENCOUNTER — Other Ambulatory Visit: Payer: Self-pay

## 2017-07-09 ENCOUNTER — Ambulatory Visit (HOSPITAL_COMMUNITY): Payer: Medicare Other | Admitting: Adult Health

## 2017-07-09 ENCOUNTER — Inpatient Hospital Stay (HOSPITAL_BASED_OUTPATIENT_CLINIC_OR_DEPARTMENT_OTHER): Payer: Medicare Other | Admitting: Internal Medicine

## 2017-07-09 ENCOUNTER — Inpatient Hospital Stay (HOSPITAL_COMMUNITY): Payer: Medicare Other | Attending: Internal Medicine

## 2017-07-09 ENCOUNTER — Encounter (HOSPITAL_COMMUNITY): Payer: Self-pay | Admitting: Internal Medicine

## 2017-07-09 VITALS — BP 144/76 | HR 78 | Temp 97.7°F | Resp 18 | Wt 240.1 lb

## 2017-07-09 DIAGNOSIS — Z79899 Other long term (current) drug therapy: Secondary | ICD-10-CM

## 2017-07-09 DIAGNOSIS — D591 Autoimmune hemolytic anemia, unspecified: Secondary | ICD-10-CM

## 2017-07-09 DIAGNOSIS — I2510 Atherosclerotic heart disease of native coronary artery without angina pectoris: Secondary | ICD-10-CM | POA: Diagnosis not present

## 2017-07-09 DIAGNOSIS — Z7982 Long term (current) use of aspirin: Secondary | ICD-10-CM | POA: Diagnosis not present

## 2017-07-09 DIAGNOSIS — E785 Hyperlipidemia, unspecified: Secondary | ICD-10-CM | POA: Diagnosis not present

## 2017-07-09 DIAGNOSIS — Z951 Presence of aortocoronary bypass graft: Secondary | ICD-10-CM | POA: Diagnosis not present

## 2017-07-09 DIAGNOSIS — D510 Vitamin B12 deficiency anemia due to intrinsic factor deficiency: Secondary | ICD-10-CM

## 2017-07-09 DIAGNOSIS — R918 Other nonspecific abnormal finding of lung field: Secondary | ICD-10-CM | POA: Insufficient documentation

## 2017-07-09 DIAGNOSIS — I10 Essential (primary) hypertension: Secondary | ICD-10-CM | POA: Diagnosis not present

## 2017-07-09 DIAGNOSIS — Z87891 Personal history of nicotine dependence: Secondary | ICD-10-CM | POA: Insufficient documentation

## 2017-07-09 DIAGNOSIS — G629 Polyneuropathy, unspecified: Secondary | ICD-10-CM | POA: Diagnosis not present

## 2017-07-09 LAB — CBC WITH DIFFERENTIAL/PLATELET
Basophils Absolute: 0 10*3/uL (ref 0.0–0.1)
Basophils Relative: 0 %
EOS ABS: 0.1 10*3/uL (ref 0.0–0.7)
EOS PCT: 1 %
HCT: 41.1 % (ref 39.0–52.0)
Hemoglobin: 13.2 g/dL (ref 13.0–17.0)
LYMPHS ABS: 0.7 10*3/uL (ref 0.7–4.0)
LYMPHS PCT: 7 %
MCH: 32 pg (ref 26.0–34.0)
MCHC: 32.1 g/dL (ref 30.0–36.0)
MCV: 99.8 fL (ref 78.0–100.0)
MONO ABS: 0.6 10*3/uL (ref 0.1–1.0)
Monocytes Relative: 6 %
Neutro Abs: 9.2 10*3/uL — ABNORMAL HIGH (ref 1.7–7.7)
Neutrophils Relative %: 86 %
PLATELETS: 217 10*3/uL (ref 150–400)
RBC: 4.12 MIL/uL — AB (ref 4.22–5.81)
RDW: 13.7 % (ref 11.5–15.5)
WBC: 10.7 10*3/uL — AB (ref 4.0–10.5)

## 2017-07-09 LAB — COMPREHENSIVE METABOLIC PANEL
ALT: 36 U/L (ref 17–63)
ANION GAP: 6 (ref 5–15)
AST: 29 U/L (ref 15–41)
Albumin: 3.8 g/dL (ref 3.5–5.0)
Alkaline Phosphatase: 72 U/L (ref 38–126)
BUN: 20 mg/dL (ref 6–20)
CHLORIDE: 102 mmol/L (ref 101–111)
CO2: 30 mmol/L (ref 22–32)
Calcium: 9.2 mg/dL (ref 8.9–10.3)
Creatinine, Ser: 1.04 mg/dL (ref 0.61–1.24)
Glucose, Bld: 118 mg/dL — ABNORMAL HIGH (ref 65–99)
POTASSIUM: 4.6 mmol/L (ref 3.5–5.1)
SODIUM: 138 mmol/L (ref 135–145)
Total Bilirubin: 1.7 mg/dL — ABNORMAL HIGH (ref 0.3–1.2)
Total Protein: 6.9 g/dL (ref 6.5–8.1)

## 2017-07-09 LAB — LACTATE DEHYDROGENASE: LDH: 263 U/L — AB (ref 98–192)

## 2017-07-09 NOTE — Patient Instructions (Signed)
[facility] Discharge Instructions  Today you saw Dr. Higgs.    Thank you for choosing [facility] to provide your oncology and hematology care.  To afford each patient quality time with our provider, please arrive at least 15 minutes before your scheduled appointment time.   If you have a lab appointment with the Cancer Center please come in thru the  Main Entrance and check in at the main information desk  You need to re-schedule your appointment should you arrive 10 or more minutes late.  We strive to give you quality time with our providers, and arriving late affects you and other patients whose appointments are after yours.  Also, if you no show three or more times for appointments you may be dismissed from the clinic at the providers discretion.     Again, thank you for choosing [facility].  Our hope is that these requests will decrease the amount of time that you wait before being seen by our physicians.       _____________________________________________________________  Should you have questions after your visit to [facility], please contact our office at [phone] between the hours of 8:30 a.m. and 4:30 p.m.  Voicemails left after 4:30 p.m. will not be returned until the following business day.  For prescription refill requests, have your pharmacy contact our office.       Resources For Cancer Patients and their Caregivers ? American Cancer Society: Can assist with transportation, wigs, general needs, runs Look Good Feel Better.        1-888-227-6333 ? Cancer Care: Provides financial assistance, online support groups, medication/co-pay assistance.  1-800-813-HOPE (4673) ? [facility] Assists [place] cancer patients and their families through emotional , educational and financial support.  336-427-4357 ? [place] DSS Where to apply for  food stamps, Medicaid and utility assistance. 336-342-1394 ? [facility]: Transportation to medical appointments. 336-347-2287 ? Social Security Administration: May apply for disability if have a Stage IV cancer. 336-342-7796 1-800-772-1213 ? [place] Aging, Disability and Transit Services: Assists with nutrition, care and transit needs. 336-349-2343  Cancer Center Support Programs:   > Cancer Support Group  2nd Tuesday of the month 1pm-2pm, Journey Room   > Creative Journey  3rd Tuesday of the month 1130am-1pm, Journey Room    

## 2017-07-09 NOTE — Progress Notes (Addendum)
Diagnosis Autoimmune hemolytic anemia (HCC) - Plan: CBC with Differential/Platelet, Comprehensive metabolic panel, Lactate dehydrogenase, CBC with Differential/Platelet, Haptoglobin, Haptoglobin  Staging Cancer Staging No matching staging information was found for the patient.  Assessment and Plan:  1.  Autoimmune hemolytic anemia, IgG Warm Reactive.  On 09/2012 Positive Coombs, increased retic, LDH, low haptogobin.  He was treated with Rituxan x4 in June 2015.  The patient is currently on prednisone 10 mg daily.   Labs done 07/09/2017 showed WBC 10.7 HB 13.2 plts 217,000.  He will decrease prednisone to 5 mg po daily and will determine if counts remain stable with labs repeated on 07/23/2017.  Pending those results he will RTC in 4 months.    2.  Pernicious anemia.  The patient remains on oral B12.  Hemoglobin stable at 13.2.  3.  Hypertension.  Blood pressure is 144/76.  .  Follow-up with PCP.    4.  Pulmonary nodule.  Review of chart shows he had a outside CT of chest done 02/2015 that showed pulmonary nodules.  Will discuss with pt  if he has undergone repeat imaging and if not will repeat imaging for ongoing follow-up.    CURRENT THERAPY: prednisone 10 mg po qd  INTERVAL HISTORY:72  y.o. male with hemolytic anemia, history of positive coombs. Currently he is on prednisone 10 mg qd.  Pt had consultation at [facility] in the past. Has been vaccinated in preparation for splenectomy but reports surgery was not recommended.  Current status.  He is seen today for follow-up.  He reports some worsening bruising on hands. He has a vacation planned for late June.  He is here to go over labs.    Problem List Patient Active Problem List   Diagnosis Date Noted  . Cold [IMO0001] 02/13/2016  . Autoimmune hemolytic anemia ([place]) [D59.1] 10/18/2015  . Pernicious anemia [D51.0] 10/18/2015  . Neuropathy [G62.9] 03/01/2015  . Lung nodule, solitary [R91.1] 12/06/2014  . Splenomegaly [R16.1] 11/01/2013  .  CORONARY ATHEROSCLEROSIS NATIVE CORONARY ARTERY [I25.10] 07/07/2009  . HYPERLIPIDEMIA-MIXED [E78.5] 11/28/2008  . Essential hypertension [I10] 11/28/2008    Past Medical History Past Medical History:  Diagnosis Date  . Coronary atherosclerosis of native coronary artery    Multivessel, LVEF 59%  . Essential hypertension, benign   . Hemolytic anemia ([place])   . Hemolytic anemia due to warm antibody ([place]) 10/18/2015  . Hyperlipidemia   . Pernicious anemia     Past Surgical History Past Surgical History:  Procedure Laterality Date  . CORONARY ARTERY BYPASS GRAFT  2004   LIMA to LAD, SVG to OM, SVG to PDA    Family History Family History  Problem Relation Age of Onset  . Dementia Mother   . Cancer Mother        colon  . COPD Father   . Ulcerative colitis Father   . Heart attack Father      Social History  reports that he quit smoking about 15 years ago. His smoking use included cigarettes. He has never used smokeless tobacco. He reports that he does not drink alcohol or use drugs.  Medications  Current Outpatient Medications:  .  aspirin EC 81 MG tablet, Take 81 mg by mouth daily., Disp: , Rfl:  .  Aspirin-Acetaminophen-Caffeine (GOODYS EXTRA STRENGTH) 500-325-65 MG PACK, Take by mouth., Disp: , Rfl:  .  b complex vitamins tablet, Take 1 tablet by mouth daily., Disp: , Rfl:  .  calcium carbonate (OS-CAL) 600 MG TABS tablet, Take 600 mg  by mouth daily with breakfast., Disp: , Rfl:  .  calcium citrate-vitamin D (CITRACAL+D) 315-200 MG-UNIT tablet, Take by mouth., Disp: , Rfl:  .  Cranberry 500 MG CAPS, Take by mouth., Disp: , Rfl:  .  Fish Oil-Cholecalciferol (FISH OIL + D3 PO), Take 1 capsule by mouth daily., Disp: , Rfl:  .  Flaxseed, Linseed, (FLAX SEED OIL PO), Take 1 capsule by mouth daily., Disp: , Rfl:  .  folic acid (FOLVITE) 1 MG tablet, Take 1 mg by mouth daily., Disp: , Rfl:  .  GARLIC OIL PO, Take 1 capsule by mouth daily., Disp: , Rfl:  .  glipiZIDE (GLUCOTROL) 5  MG tablet, Take 5 mg by mouth daily. , Disp: , Rfl:  .  insulin aspart (NOVOLOG) 100 UNIT/ML injection, Inject 0-6 Units into the skin as needed. , Disp: , Rfl:  .  levothyroxine (SYNTHROID, LEVOTHROID) 50 MCG tablet, Take 50 mcg by mouth daily., Disp: , Rfl:  .  Magnesium 250 MG TABS, Take 1 tablet by mouth daily., Disp: , Rfl:  .  metFORMIN (GLUCOPHAGE) 500 MG tablet, Take 500 mg by mouth daily with breakfast., Disp: , Rfl:  .  metoprolol succinate (TOPROL-XL) 25 MG 24 hr tablet, Take 25 mg by mouth daily.  , Disp: , Rfl:  .  Misc Natural Products (PROSTATE CONTROL PO), Take 1 tablet by mouth daily., Disp: , Rfl:  .  Multiple Vitamin (MULTIVITAMIN) tablet, Take 1 tablet by mouth daily., Disp: , Rfl:  .  Multiple Vitamins-Minerals (MULTI FOR HIM 50+) TABS, Take by mouth., Disp: , Rfl:  .  Omega 3-6-9 CAPS, Take by mouth., Disp: , Rfl:  .  POTASSIUM PO, Take by mouth., Disp: , Rfl:  .  predniSONE (DELTASONE) 10 MG tablet, TAKE 1 TABLET ONCE DAILY., Disp: 30 tablet, Rfl: 1 .  psyllium (METAMUCIL) 58.6 % powder, Take 1 packet by mouth daily. , Disp: , Rfl:  .  Red Yeast Rice 600 MG TABS, Take by mouth., Disp: , Rfl:  .  Saw Palmetto 160 MG CAPS, Take by mouth., Disp: , Rfl:  .  vitamin B-12 (CYANOCOBALAMIN) 1000 MCG tablet, Take 1,000 mcg by mouth daily., Disp: , Rfl:  .  Zn-Pyg Afri-Nettle-Saw Palmet (SAW PALMETTO COMPLEX PO), Take 2 capsules by mouth 2 (two) times daily., Disp: , Rfl:   Allergies Patient has no known allergies.  Review of Systems Review of Systems - Oncology ROS as per HPI otherwise 12 point ROS is negative.   Physical Exam  Vitals Wt Readings from Last 3 Encounters:  07/09/17 240 lb 1.6 oz (108.9 kg)  05/07/17 240 lb (108.9 kg)  03/12/17 240 lb 4.8 oz (109 kg)   Temp Readings from Last 3 Encounters:  07/09/17 97.7 F (36.5 C) (Oral)  03/12/17 97.8 F (36.6 C) (Oral)  05/06/16 97.8 F (36.6 C) (Oral)   BP Readings from Last 3 Encounters:  07/09/17 (!)  144/76  05/07/17 138/78  03/12/17 (!) 141/67   Pulse Readings from Last 3 Encounters:  07/09/17 78  05/07/17 71  03/12/17 75   Constitutional: Well-developed, well-nourished, and in no distress.   HENT: Head: Normocephalic and atraumatic.  Mouth/Throat: No oropharyngeal exudate. Mucosa moist. Eyes: Pupils are equal, round, and reactive to light. Conjunctivae are normal. No scleral icterus.  Neck: Normal range of motion. Neck supple. No JVD present.  Cardiovascular: Normal rate, regular rhythm and normal heart sounds.  Exam reveals no gallop and no friction rub.   No murmur heard. Pulmonary/Chest:  Effort normal and breath sounds normal. No respiratory distress. No wheezes.No rales.  Abdominal: Soft. Bowel sounds are normal. No distension. There is no tenderness. There is no guarding.  Musculoskeletal: No edema or tenderness.  Lymphadenopathy: No cervical, axillary or supraclavicular adenopathy.  Neurological: Alert and oriented to person, place, and time. No cranial nerve deficit.  Skin: Skin is warm and dry. No rash noted. No erythema. No pallor.  Psychiatric: Affect and judgment normal.   Labs Appointment on 07/09/2017  Component Date Value Ref Range Status  . WBC 07/09/2017 10.7* 4.0 - 10.5 K/uL Final  . RBC 07/09/2017 4.12* 4.22 - 5.81 MIL/uL Final  . Hemoglobin 07/09/2017 13.2  13.0 - 17.0 g/dL Final  . HCT 07/09/2017 41.1  39.0 - 52.0 % Final  . MCV 07/09/2017 99.8  78.0 - 100.0 fL Final  . MCH 07/09/2017 32.0  26.0 - 34.0 pg Final  . MCHC 07/09/2017 32.1  30.0 - 36.0 g/dL Final  . RDW 07/09/2017 13.7  11.5 - 15.5 % Final  . Platelets 07/09/2017 217  150 - 400 K/uL Final  . Neutrophils Relative % 07/09/2017 86  % Final  . Neutro Abs 07/09/2017 9.2* 1.7 - 7.7 K/uL Final  . Lymphocytes Relative 07/09/2017 7  % Final  . Lymphs Abs 07/09/2017 0.7  0.7 - 4.0 K/uL Final  . Monocytes Relative 07/09/2017 6  % Final  . Monocytes Absolute 07/09/2017 0.6  0.1 - 1.0 K/uL Final  .  Eosinophils Relative 07/09/2017 1  % Final  . Eosinophils Absolute 07/09/2017 0.1  0.0 - 0.7 K/uL Final  . Basophils Relative 07/09/2017 0  % Final  . Basophils Absolute 07/09/2017 0.0  0.0 - 0.1 K/uL Final   Performed at [facility], [address] [place], [place] [address]  . Sodium 07/09/2017 138  135 - 145 mmol/L Final  . Potassium 07/09/2017 4.6  3.5 - 5.1 mmol/L Final  . Chloride 07/09/2017 102  101 - 111 mmol/L Final  . CO2 07/09/2017 30  22 - 32 mmol/L Final  . Glucose, Bld 07/09/2017 118* 65 - 99 mg/dL Final  . BUN 07/09/2017 20  6 - 20 mg/dL Final  . Creatinine, Ser 07/09/2017 1.04  0.61 - 1.24 mg/dL Final  . Calcium 07/09/2017 9.2  8.9 - 10.3 mg/dL Final  . Total Protein 07/09/2017 6.9  6.5 - 8.1 g/dL Final  . Albumin 07/09/2017 3.8  3.5 - 5.0 g/dL Final  . AST 07/09/2017 29  15 - 41 U/L Final  . ALT 07/09/2017 36  17 - 63 U/L Final  . Alkaline Phosphatase 07/09/2017 72  38 - 126 U/L Final  . Total Bilirubin 07/09/2017 1.7* 0.3 - 1.2 mg/dL Final  . GFR calc non Af Amer 07/09/2017 >60  >60 mL/min Final  . GFR calc Af Amer 07/09/2017 >60  >60 mL/min Final   Comment: (NOTE) The eGFR has been calculated using the CKD EPI equation. This calculation has not been validated in all clinical situations. eGFR's persistently <60 mL/min signify possible Chronic Kidney Disease.   . Anion gap 07/09/2017 6  5 - 15 Final   Performed at [facility], [address] [place], [place] [address]  . LDH 07/09/2017 263* 98 - 192 U/L Final   Performed at [facility], [address] [place], [place] [address]     Pathology Orders Placed This Encounter  Procedures  . CBC with Differential/Platelet    Standing Status:   Future    Standing Expiration Date:     07/10/2018  . Comprehensive metabolic panel    Standing Status:   Future    Standing Expiration Date:   07/10/2018  . Lactate dehydrogenase    Standing Status:   Future    Standing Expiration Date:   07/10/2018  . CBC with  Differential/Platelet    Standing Status:   Future    Standing Expiration Date:   07/10/2018  . Haptoglobin    Standing Status:   Future    Standing Expiration Date:   07/10/2018  . Haptoglobin    Standing Status:   Future    Standing Expiration Date:   07/10/2018       Zoila Shutter MD

## 2017-07-23 ENCOUNTER — Inpatient Hospital Stay (HOSPITAL_COMMUNITY): Payer: Medicare Other | Attending: Hematology

## 2017-07-23 DIAGNOSIS — D591 Autoimmune hemolytic anemia, unspecified: Secondary | ICD-10-CM

## 2017-07-23 LAB — CBC WITH DIFFERENTIAL/PLATELET
BASOS ABS: 0.1 10*3/uL (ref 0.0–0.1)
BASOS PCT: 1 %
EOS ABS: 0.3 10*3/uL (ref 0.0–0.7)
Eosinophils Relative: 3 %
HCT: 41.2 % (ref 39.0–52.0)
HEMOGLOBIN: 13.2 g/dL (ref 13.0–17.0)
Lymphocytes Relative: 9 %
Lymphs Abs: 0.8 10*3/uL (ref 0.7–4.0)
MCH: 32.5 pg (ref 26.0–34.0)
MCHC: 32 g/dL (ref 30.0–36.0)
MCV: 101.5 fL — ABNORMAL HIGH (ref 78.0–100.0)
MONOS PCT: 8 %
Monocytes Absolute: 0.7 10*3/uL (ref 0.1–1.0)
Neutro Abs: 7 10*3/uL (ref 1.7–7.7)
Neutrophils Relative %: 79 %
Platelets: 215 10*3/uL (ref 150–400)
RBC: 4.06 MIL/uL — AB (ref 4.22–5.81)
RDW: 13.4 % (ref 11.5–15.5)
WBC: 8.8 10*3/uL (ref 4.0–10.5)

## 2017-07-24 LAB — HAPTOGLOBIN: Haptoglobin: <10 mg/dL — ABNORMAL LOW (ref 34–200)

## 2017-07-25 ENCOUNTER — Telehealth (HOSPITAL_COMMUNITY): Payer: Self-pay

## 2017-07-25 ENCOUNTER — Other Ambulatory Visit (HOSPITAL_COMMUNITY): Payer: Self-pay | Admitting: Internal Medicine

## 2017-07-25 DIAGNOSIS — D591 Autoimmune hemolytic anemia, unspecified: Secondary | ICD-10-CM

## 2017-07-25 DIAGNOSIS — R911 Solitary pulmonary nodule: Secondary | ICD-10-CM

## 2017-07-25 NOTE — Telephone Encounter (Signed)
Notified patient that Dr. Melton AlarHiggs wants him to have a CT scan of his chest to follow up on some pulmonary nodules and labs also in 1 month. Gave patient appts. He verbalized understanding.

## 2017-07-25 NOTE — Telephone Encounter (Signed)
Per Dr. Melton AlarHiggs request, called patient to let him know that his labs were good and to continue his prednisone at 5 mg daily. Patient verbalized understanding.

## 2017-07-26 IMAGING — CT NM PET TUM IMG INITIAL (PI) SKULL BASE T - THIGH
7 series · 25 of 25 positions shown · non-contrast
Comparison: 10/14/2014 CT abdomen/ pelvis. 10/15/2012 CT of the
chest, abdomen and pelvis.

CLINICAL DATA: Initial treatment strategy for autoimmune hemolytic
anemia due to IgG with splenomegaly. Clinical concern for NHL.

EXAM:
NUCLEAR MEDICINE PET SKULL BASE TO THIGH
TECHNIQUE: 11.12 mCi F-18 FDG was injected intravenously. Full-ring PET imaging
was performed from the skull base to thigh after the radiotracer. CT
data was obtained and used for attenuation correction and anatomic
localization.
FASTING BLOOD GLUCOSE:  Value: 178 mg/dl

[Series 3: pet sk_thigh ac · axial · 5.0mm · 4.07mm/px · z∈[-918,+10]mm · 5 of 233 slices shown]
[im 1/233]
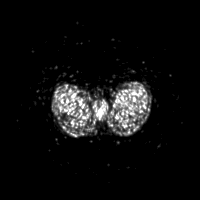
[im 59/233]
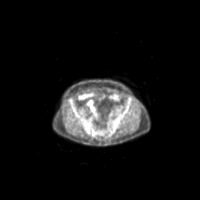
[im 117/233]
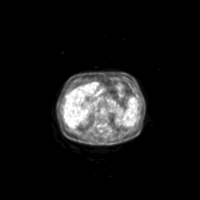
[im 175/233]
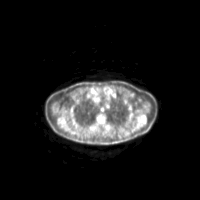
[im 233/233]
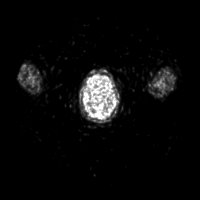

[Series 4: ct sk_thigh 5.0 hd_fov · axial · 5.0mm · 1.14mm/px · z∈[-918,+10]mm · 5 of 233 slices shown]
[im 1/233]
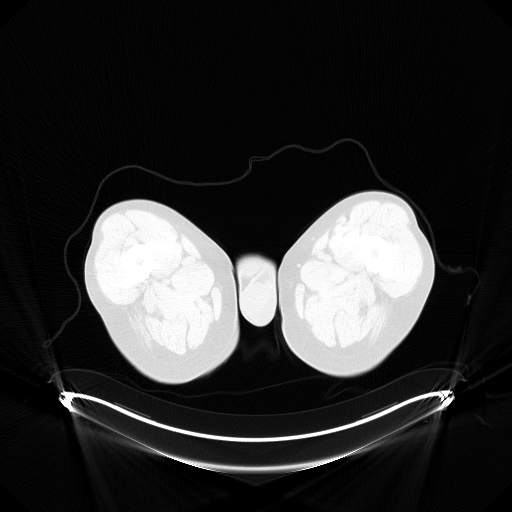
[im 59/233]
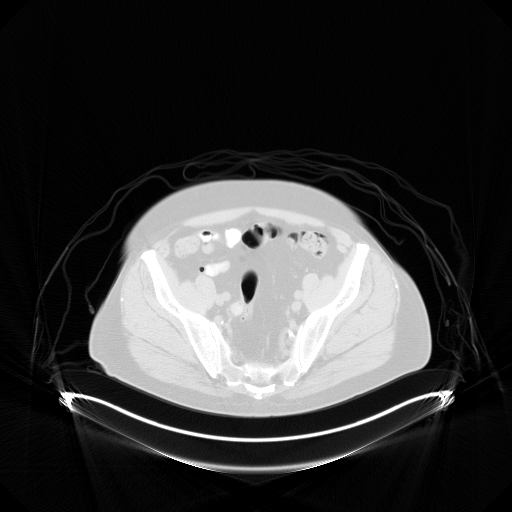
[im 117/233]
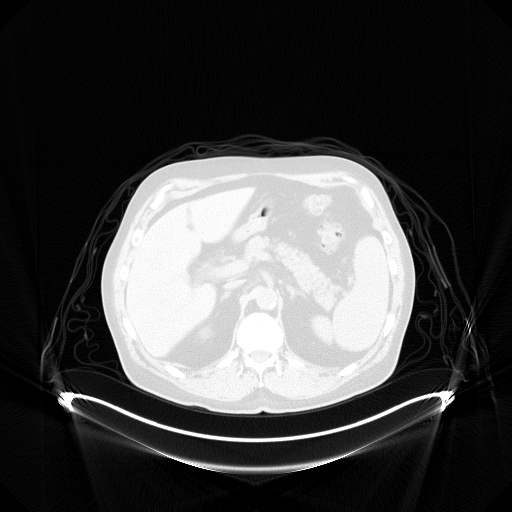
[im 175/233]
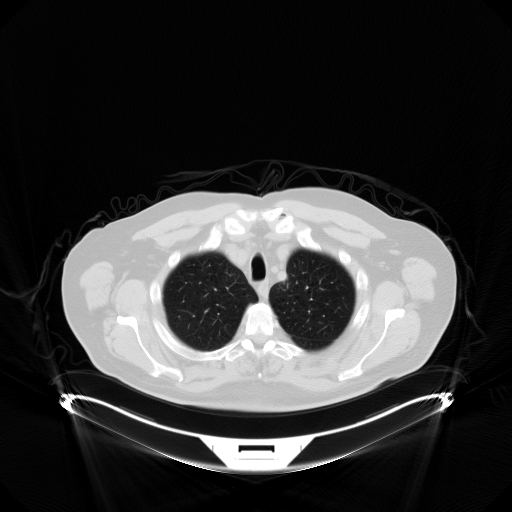
[im 233/233  brain]
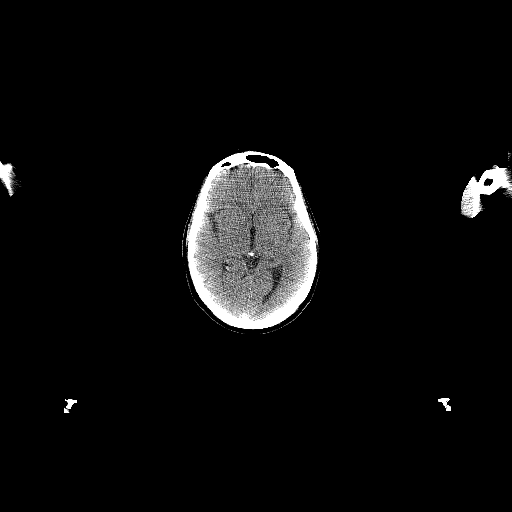

[Series 7: pet sk_thigh nac · axial · 5.0mm · 4.07mm/px · z∈[-918,+10]mm · 5 of 233 slices shown]
[im 1/233]
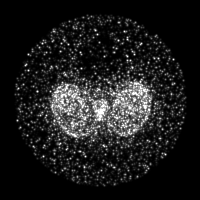
[im 59/233]
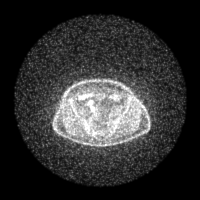
[im 117/233]
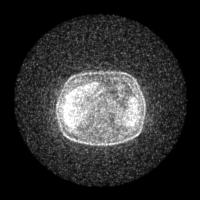
[im 175/233]
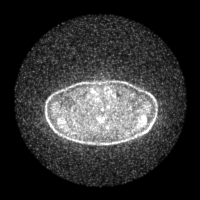
[im 233/233]
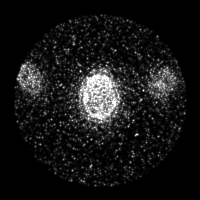

[Series 8: ct sk_thigh 5.0 b70f lung_bone · axial · 5.0mm · 0.72mm/px · z∈[-426,-162]mm · 2 of 67 slices shown]
[im 1/67  bone]
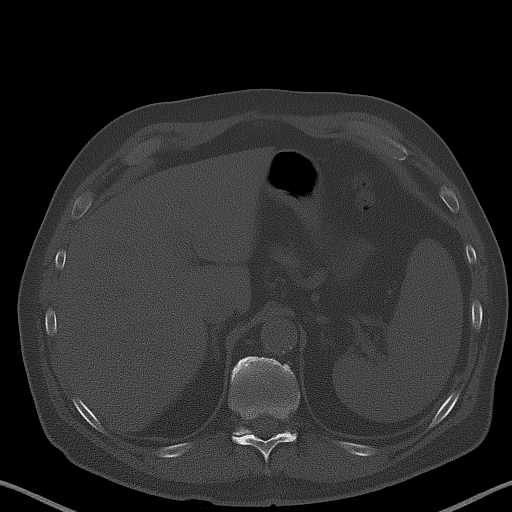
[im 67/67  bone]
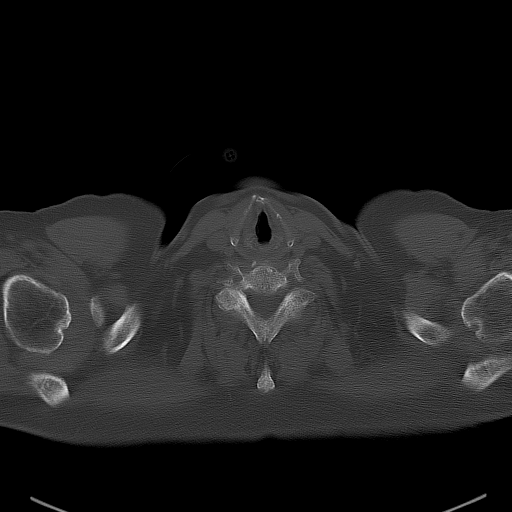

[Series 604: mip collection<mip range> · coronal · 1.93mm/px · 1 of 32 slices shown]
[im 1/32]
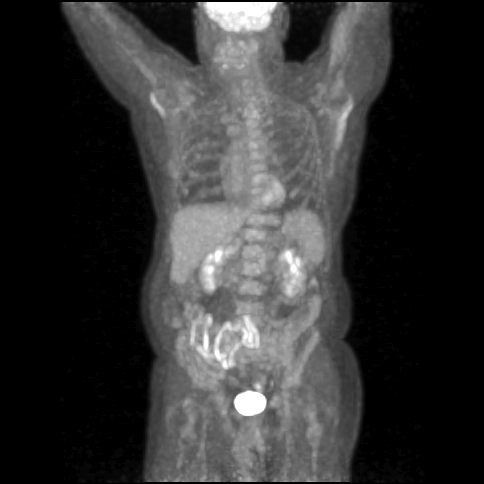

[Series 605: range-ct sk_thigh 5.0 hd_fov-cor-<alpha range> · 2 of 78 slices shown]
[im 1/78]
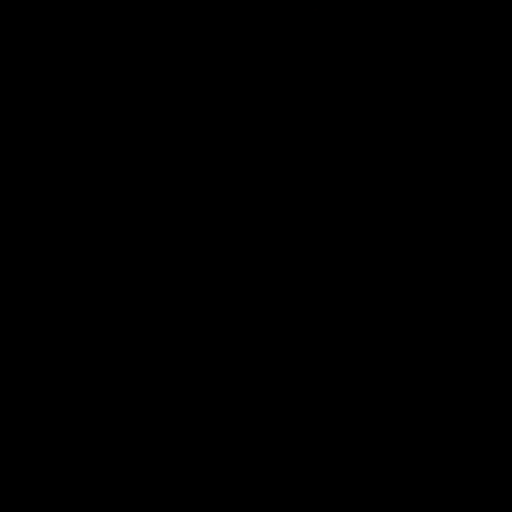
[im 78/78]
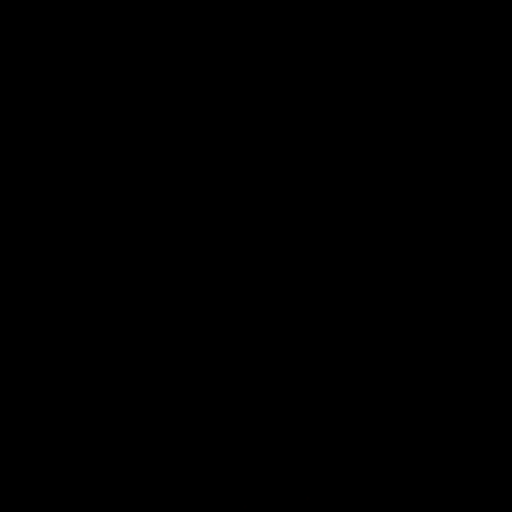

[Series 606: range-ct sk_thigh 5.0 hd_fov-tra-<alpha range> · 5 of 214 slices shown]
[im 1/214]
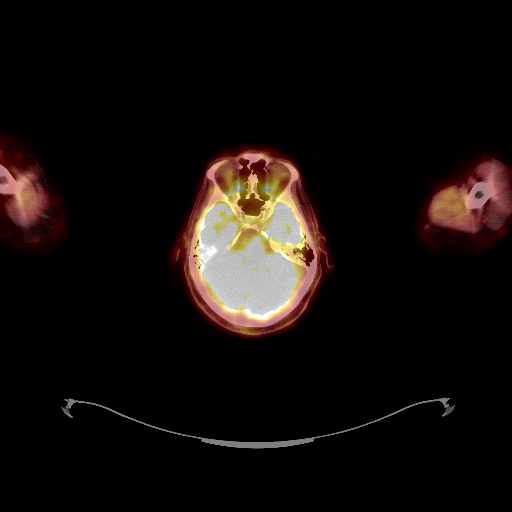
[im 54/214]
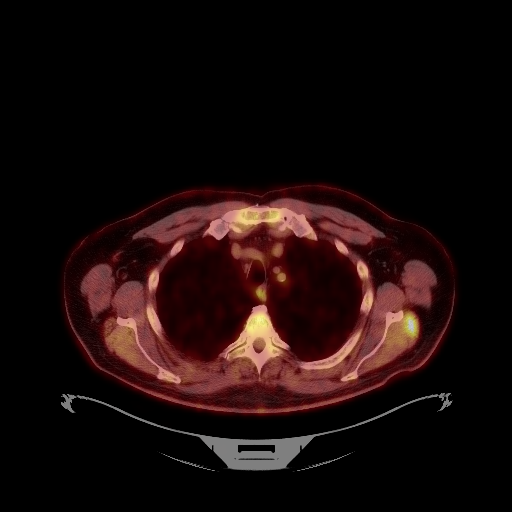
[im 107/214]
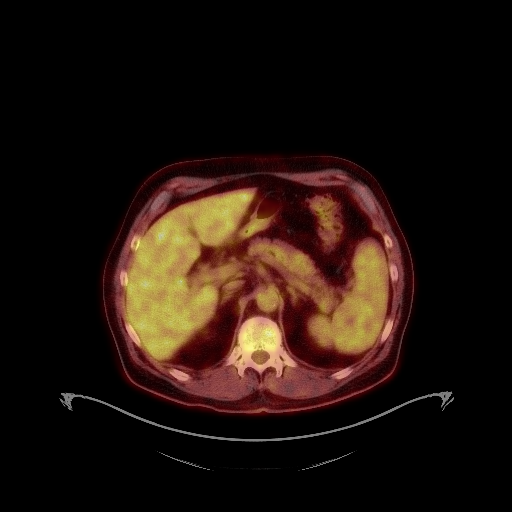
[im 160/214]
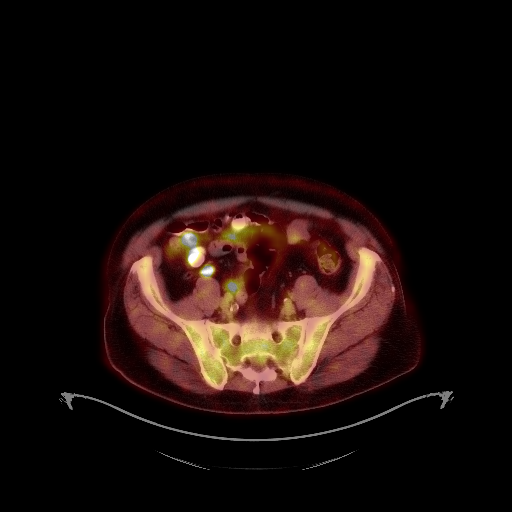
[im 214/214]
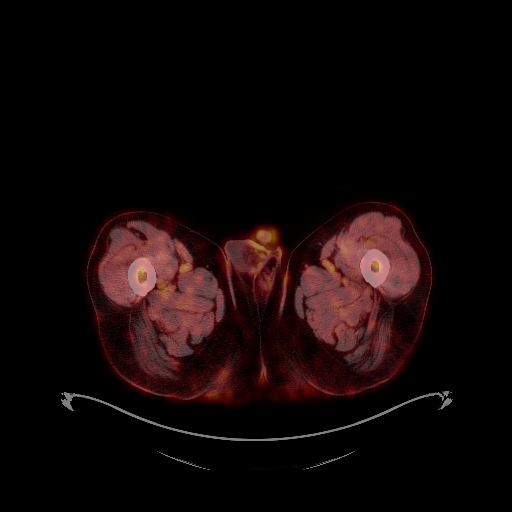

[25 of 25 positions shown; findings below may reference images not displayed]

FINDINGS: NECK

No hypermetabolic lymph nodes in the neck.

CHEST

No hypermetabolic axillary, mediastinal or hilar nodes. There is
atherosclerosis of the thoracic aorta, the great vessels of the
mediastinum and the coronary arteries, including calcified
atherosclerotic plaque in the left anterior descending, left
circumflex and right coronary arteries. Patient is status post
coronary artery bypass surgery with left internal mammary and
ascending aortic grafts. There is mild-to-moderate centrilobular
emphysema. There is a 1.1 x 0.9 cm sub solid left upper lobe
pulmonary nodule (series 8/ image 21) with minimal metabolism (max
SUV 1.3), which is new since 10/15/2012. There is patchy
consolidation, ground-glass opacity and tree-in-bud opacity in the
basilar right upper lobe with associated mild FDG uptake (max SUV
2.8), also new since 10/15/2012.

ABDOMEN/PELVIS

No abnormal hypermetabolic activity within the liver, pancreas,
adrenal glands, or spleen. Stable mild splenomegaly (craniocaudal
splenic length 13.2 cm). No liver or splenic masses. Stable
granulomatous calcification in the inferior spleen. No
hypermetabolic lymph nodes in the abdomen or pelvis. Stable ectasia
of the atherosclerotic infrarenal abdominal aorta, maximum diameter
2.8 cm. Gallbladder is mildly distended with faintly calcified
gallstones. No gallbladder wall thickening or pericholecystic fat
stranding. Marked sigmoid diverticulosis, with no colonic wall
thickening or pericolonic fat stranding. Top-normal size prostate.
Small bilateral fat containing inguinal hernias, stable.

SKELETON

No focal hypermetabolic activity to suggest skeletal metastasis.
Median sternotomy wires appear intact. Stable chronic ununited right
L4 transverse process fracture.
IMPRESSION: 1. No hypermetabolic lymphadenopathy.
2. Stable mild splenomegaly without splenic hypermetabolism.
3. New patchy consolidation and ground-glass opacity in the basilar
right upper lobe and new sub solid left upper lobe pulmonary nodule,
both with mild FDG uptake, favor infectious/inflammatory etiology.
Initial follow-up by chest CT without contrast is recommended in 3
months to confirm persistence. This recommendation follows the
consensus statement: Recommendations for the Management of Subsolid
Pulmonary Nodules Detected at CT: A Statement from the [facility]
4. Three-vessel coronary artery atherosclerotic calcifications
status post CABG.
5. Cholelithiasis.
6. Marked sigmoid diverticulosis.
7. Stable ectasia of the atherosclerotic infrarenal abdominal aorta,
maximum diameter 2.8 cm. Ectatic abdominal aorta at risk for
aneurysm development. Recommend followup by ultrasound in 5 years.
This recommendation follows ACR consensus guidelines: White Paper of
the ACR Incidental Findings Committee II on Vascular Findings. [HOSPITAL] 3537; [DATE].
8. Stable small bilateral fat containing inguinal hernias.

## 2017-07-28 ENCOUNTER — Other Ambulatory Visit (HOSPITAL_COMMUNITY): Payer: Self-pay | Admitting: Adult Health

## 2017-08-19 ENCOUNTER — Ambulatory Visit (HOSPITAL_COMMUNITY)
Admission: RE | Admit: 2017-08-19 | Discharge: 2017-08-19 | Disposition: A | Discharge status: Home / Self Care | Payer: Medicare Other | Source: Ambulatory Visit | Attending: Internal Medicine | Admitting: Internal Medicine

## 2017-08-19 ENCOUNTER — Inpatient Hospital Stay (HOSPITAL_COMMUNITY): Payer: Medicare Other | Attending: Hematology

## 2017-08-19 DIAGNOSIS — I700 Atherosclerosis of aorta: Secondary | ICD-10-CM | POA: Diagnosis not present

## 2017-08-19 DIAGNOSIS — D591 Autoimmune hemolytic anemia, unspecified: Secondary | ICD-10-CM

## 2017-08-19 DIAGNOSIS — J432 Centrilobular emphysema: Secondary | ICD-10-CM | POA: Diagnosis not present

## 2017-08-19 DIAGNOSIS — R911 Solitary pulmonary nodule: Secondary | ICD-10-CM

## 2017-08-19 LAB — CBC WITH DIFFERENTIAL/PLATELET
BASOS ABS: 0.1 10*3/uL (ref 0.0–0.1)
BASOS PCT: 1 %
Eosinophils Absolute: 0.3 10*3/uL (ref 0.0–0.7)
Eosinophils Relative: 4 %
HEMATOCRIT: 38.9 % — AB (ref 39.0–52.0)
HEMOGLOBIN: 12.4 g/dL — AB (ref 13.0–17.0)
Lymphocytes Relative: 10 %
Lymphs Abs: 0.8 10*3/uL (ref 0.7–4.0)
MCH: 32.3 pg (ref 26.0–34.0)
MCHC: 31.9 g/dL (ref 30.0–36.0)
MCV: 101.3 fL — AB (ref 78.0–100.0)
MONO ABS: 0.7 10*3/uL (ref 0.1–1.0)
MONOS PCT: 9 %
NEUTROS ABS: 6.1 10*3/uL (ref 1.7–7.7)
NEUTROS PCT: 76 %
Platelets: 206 10*3/uL (ref 150–400)
RBC: 3.84 MIL/uL — ABNORMAL LOW (ref 4.22–5.81)
RDW: 14.2 % (ref 11.5–15.5)
WBC: 8 10*3/uL (ref 4.0–10.5)

## 2017-08-19 LAB — COMPREHENSIVE METABOLIC PANEL
ALBUMIN: 3.9 g/dL (ref 3.5–5.0)
ALK PHOS: 88 U/L (ref 38–126)
ALT: 30 U/L (ref 0–44)
ANION GAP: 7 (ref 5–15)
AST: 38 U/L (ref 15–41)
BILIRUBIN TOTAL: 1.6 mg/dL — AB (ref 0.3–1.2)
BUN: 16 mg/dL (ref 8–23)
CALCIUM: 8.9 mg/dL (ref 8.9–10.3)
CHLORIDE: 105 mmol/L (ref 98–111)
CO2: 27 mmol/L (ref 22–32)
CREATININE: 1.12 mg/dL (ref 0.61–1.24)
GFR calc Af Amer: >60 mL/min (ref >60)
GFR calc non Af Amer: >60 mL/min (ref >60)
GLUCOSE: 139 mg/dL — AB (ref 70–99)
Potassium: 4.1 mmol/L (ref 3.5–5.1)
Sodium: 139 mmol/L (ref 135–145)
Total Protein: 6.9 g/dL (ref 6.5–8.1)

## 2017-08-19 LAB — LACTATE DEHYDROGENASE: LDH: 320 U/L — ABNORMAL HIGH (ref 98–192)

## 2017-08-20 LAB — HAPTOGLOBIN: Haptoglobin: <10 mg/dL — ABNORMAL LOW (ref 34–200)

## 2017-09-01 ENCOUNTER — Other Ambulatory Visit (HOSPITAL_COMMUNITY): Payer: Self-pay | Admitting: *Deleted

## 2017-09-01 MED ORDER — FOLIC ACID 1 MG PO TABS: ORAL_TABLET | ORAL | 0 refills | Status: DC

## 2017-09-04 ENCOUNTER — Telehealth (HOSPITAL_COMMUNITY): Payer: Self-pay

## 2017-09-04 DIAGNOSIS — D510 Vitamin B12 deficiency anemia due to intrinsic factor deficiency: Secondary | ICD-10-CM

## 2017-09-04 MED ORDER — PREDNISONE 10 MG PO TABS: 5.0000 mg | ORAL_TABLET | Freq: Every day | ORAL | 2 refills | Status: DC

## 2017-09-04 NOTE — Telephone Encounter (Signed)
Patient called stating he needed a refill on prednisone. Reviewed with Dr. Melton AlarHiggs who okayed refill. Prescription sent to patients pharmacy ([facility])

## 2017-09-25 ENCOUNTER — Other Ambulatory Visit (HOSPITAL_COMMUNITY): Payer: Self-pay | Admitting: Internal Medicine

## 2017-09-29 ENCOUNTER — Other Ambulatory Visit (HOSPITAL_COMMUNITY): Payer: Self-pay | Admitting: *Deleted

## 2017-09-29 DIAGNOSIS — D591 Autoimmune hemolytic anemia, unspecified: Secondary | ICD-10-CM

## 2017-09-29 MED ORDER — FOLIC ACID 1 MG PO TABS: ORAL_TABLET | ORAL | 4 refills | Status: DC

## 2017-10-09 ENCOUNTER — Ambulatory Visit: Payer: Self-pay | Admitting: Allergy

## 2017-11-10 ENCOUNTER — Inpatient Hospital Stay (HOSPITAL_COMMUNITY): Payer: Medicare Other | Attending: Hematology

## 2017-11-10 DIAGNOSIS — D591 Autoimmune hemolytic anemia, unspecified: Secondary | ICD-10-CM

## 2017-11-10 LAB — CBC WITH DIFFERENTIAL/PLATELET
BASOS ABS: 0.1 10*3/uL (ref 0.0–0.1)
BASOS PCT: 1 %
EOS ABS: 0.1 10*3/uL (ref 0.0–0.7)
Eosinophils Relative: 1 %
HCT: 40.4 % (ref 39.0–52.0)
Hemoglobin: 13.5 g/dL (ref 13.0–17.0)
Lymphocytes Relative: 9 %
Lymphs Abs: 0.8 10*3/uL (ref 0.7–4.0)
MCH: 33.1 pg (ref 26.0–34.0)
MCHC: 33.4 g/dL (ref 30.0–36.0)
MCV: 99 fL (ref 78.0–100.0)
MONO ABS: 0.5 10*3/uL (ref 0.1–1.0)
MONOS PCT: 5 %
NEUTROS ABS: 8 10*3/uL — AB (ref 1.7–7.7)
Neutrophils Relative %: 84 %
PLATELETS: 215 10*3/uL (ref 150–400)
RBC: 4.08 MIL/uL — ABNORMAL LOW (ref 4.22–5.81)
RDW: 13.5 % (ref 11.5–15.5)
WBC: 9.4 10*3/uL (ref 4.0–10.5)

## 2017-11-10 LAB — COMPREHENSIVE METABOLIC PANEL
ALBUMIN: 3.9 g/dL (ref 3.5–5.0)
ALT: 33 U/L (ref 0–44)
AST: 30 U/L (ref 15–41)
Alkaline Phosphatase: 86 U/L (ref 38–126)
Anion gap: 9 (ref 5–15)
BILIRUBIN TOTAL: 1.4 mg/dL — AB (ref 0.3–1.2)
BUN: 19 mg/dL (ref 8–23)
CHLORIDE: 106 mmol/L (ref 98–111)
CO2: 23 mmol/L (ref 22–32)
Calcium: 8.8 mg/dL — ABNORMAL LOW (ref 8.9–10.3)
Creatinine, Ser: 1.08 mg/dL (ref 0.61–1.24)
GFR calc Af Amer: >60 mL/min (ref >60)
GFR calc non Af Amer: >60 mL/min (ref >60)
Glucose, Bld: 182 mg/dL — ABNORMAL HIGH (ref 70–99)
POTASSIUM: 4.2 mmol/L (ref 3.5–5.1)
SODIUM: 138 mmol/L (ref 135–145)
TOTAL PROTEIN: 7.1 g/dL (ref 6.5–8.1)

## 2017-11-10 LAB — LACTATE DEHYDROGENASE: LDH: 266 U/L — ABNORMAL HIGH (ref 98–192)

## 2017-11-11 LAB — HAPTOGLOBIN: Haptoglobin: <10 mg/dL — ABNORMAL LOW (ref 34–200)

## 2017-11-17 ENCOUNTER — Inpatient Hospital Stay (HOSPITAL_COMMUNITY): Payer: Medicare Other | Attending: Hematology | Admitting: Internal Medicine

## 2017-11-17 ENCOUNTER — Other Ambulatory Visit: Payer: Self-pay

## 2017-11-17 ENCOUNTER — Encounter (HOSPITAL_COMMUNITY): Payer: Self-pay | Admitting: Internal Medicine

## 2017-11-17 VITALS — BP 129/57 | HR 77 | Temp 98.0°F | Resp 16 | Wt 242.5 lb

## 2017-11-17 DIAGNOSIS — I10 Essential (primary) hypertension: Secondary | ICD-10-CM

## 2017-11-17 DIAGNOSIS — D591 Autoimmune hemolytic anemia, unspecified: Secondary | ICD-10-CM

## 2017-11-17 DIAGNOSIS — Z794 Long term (current) use of insulin: Secondary | ICD-10-CM | POA: Diagnosis not present

## 2017-11-17 DIAGNOSIS — Z79899 Other long term (current) drug therapy: Secondary | ICD-10-CM

## 2017-11-17 DIAGNOSIS — Z7982 Long term (current) use of aspirin: Secondary | ICD-10-CM | POA: Diagnosis not present

## 2017-11-17 DIAGNOSIS — M7989 Other specified soft tissue disorders: Secondary | ICD-10-CM

## 2017-11-17 DIAGNOSIS — R918 Other nonspecific abnormal finding of lung field: Secondary | ICD-10-CM | POA: Diagnosis not present

## 2017-11-17 DIAGNOSIS — I700 Atherosclerosis of aorta: Secondary | ICD-10-CM | POA: Diagnosis not present

## 2017-11-17 DIAGNOSIS — Z87891 Personal history of nicotine dependence: Secondary | ICD-10-CM | POA: Diagnosis not present

## 2017-11-17 DIAGNOSIS — D510 Vitamin B12 deficiency anemia due to intrinsic factor deficiency: Secondary | ICD-10-CM | POA: Diagnosis not present

## 2017-11-17 DIAGNOSIS — J432 Centrilobular emphysema: Secondary | ICD-10-CM | POA: Diagnosis not present

## 2017-11-17 MED ORDER — PREDNISONE 5 MG PO TABS: 5.0000 mg | ORAL_TABLET | Freq: Every day | ORAL | 4 refills | Status: DC

## 2017-11-17 NOTE — Progress Notes (Signed)
Diagnosis Autoimmune hemolytic anemia (HCC) - Plan: CBC with Differential/Platelet, Comprehensive metabolic panel, Lactate dehydrogenase  Staging Cancer Staging No matching staging information was found for the patient.  Assessment and Plan:  1.  Autoimmune hemolytic anemia, IgG Warm Reactive.  On 09/2012 Positive Coombs, increased retic, LDH, low haptogobin.  He was treated with Rituxan x4 in June 2015.  The patient is currently on prednisone 5 mg daily.   Labs done 11/10/2017 reviewed and showed WBC 9.4 HB 13.5 plts 215,000.  Chemistries WNL with K+ 4.2, Cr 1.08 and normal LFTs.  LDH 266.  Plt count remains stable since May 2019  at > 200,000.  He will continue prednisone 5 mg daily.  He will RTC in 02/2018 for follow-up and repeat labs.  Rx Prednisone 5 mg daily 90 tablets with 4 refills sent to pharmacy.   2.  Recent fall.  He reports he fell yesterday after tripping.  He did not go to ER.  He was concerned out chest injury.  No clear areas of trauma noted on chest.  He was given option of CXR.  He does not desire to have CXR performed.  He reports he will follow-up with PCP.  Pt advised to go to ER if symptoms change.    3.  Pernicious anemia.  The patient remains on oral B12.  Hemoglobin 13.5 which is stable.    4.  Hypertension.  Blood pressure is 129/57.  Follow-up with PCP.    5  Pulmonary nodule.  CT chest done 08/19/2017 reviewed and showed IMPRESSION: 1. Stable appearance of the vague subpleural nodule in superior segment of the right lower lobe most consistent with scarring. This nodular opacity has been stable for 2 years, therefore is considered benign in origin. No new or enlarging pulmonary nodule is seen. 2. Diffuse changes of centrilobular emphysema. 3. Moderate thoracic aortic atherosclerosis.  Pt will be set up for repeat imaging in 08/2018.    6.  Leg swelling.  Pt has a bruise noted on medial aspect of right leg.  He reports this occurred after fall.  He will follow-up  with PCP if no improvement.    Greater than 25 minutes spent with more than 50% spent in counseling and coordination of care.    CURRENT THERAPY: prednisone 5 mg po qd  INTERVAL HISTORY: Historical data obtained from note dated 07/09/2017.  72  y.o. male with hemolytic anemia, history of positive coombs. Currently he is on prednisone 10 mg qd.  Pt had consultation at [facility] in the past. Has been vaccinated in preparation for splenectomy but reports surgery was not recommended.  Current status.  He is seen today for follow-up.  He reports a fall yesterday after tripping.  He did not report to ER.    Problem List Patient Active Problem List   Diagnosis Date Noted  . Cold [IMO0001] 02/13/2016  . Autoimmune hemolytic anemia (HCC) [D59.1] 10/18/2015  . Pernicious anemia [D51.0] 10/18/2015  . Neuropathy [G62.9] 03/01/2015  . Lung nodule, solitary [R91.1] 12/06/2014  . Splenomegaly [R16.1] 11/01/2013  . CORONARY ATHEROSCLEROSIS NATIVE CORONARY ARTERY [I25.10] 07/07/2009  . HYPERLIPIDEMIA-MIXED [E78.5] 11/28/2008  . Essential hypertension [I10] 11/28/2008    Past Medical History Past Medical History:  Diagnosis Date  . Coronary atherosclerosis of native coronary artery    Multivessel, LVEF 59%  . Essential hypertension, benign   . Hemolytic anemia (HCC)   . 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 Hyperlipidemia   . Pernicious anemia       Past Surgical History Past Surgical History:  Procedure Laterality Date  . CORONARY ARTERY BYPASS GRAFT  2004   LIMA to LAD, SVG to OM, SVG to PDA    Family History Family History  Problem Relation Age of Onset  . Dementia Mother   . Cancer Mother        colon  . COPD Father   . Ulcerative colitis Father   . Heart attack Father      Social History  reports that he quit smoking about 15 years ago. His smoking use included cigarettes. He has never used smokeless tobacco. He reports that he does not drink alcohol or use  drugs.  Medications  Current Outpatient Medications:  .  aspirin EC 81 MG tablet, Take 81 mg by mouth daily., Disp: , Rfl:  .  Aspirin-Acetaminophen-Caffeine (GOODYS EXTRA STRENGTH) 500-325-65 MG PACK, Take by mouth., Disp: , Rfl:  .  b complex vitamins tablet, Take 1 tablet by mouth daily., Disp: , Rfl:  .  calcium carbonate (OS-CAL) 600 MG TABS tablet, Take 600 mg by mouth daily with breakfast., Disp: , Rfl:  .  calcium citrate-vitamin D (CITRACAL+D) 315-200 MG-UNIT tablet, Take by mouth., Disp: , Rfl:  .  Cranberry 500 MG CAPS, Take by mouth., Disp: , Rfl:  .  Fish Oil-Cholecalciferol (FISH OIL + D3 PO), Take 1 capsule by mouth daily., Disp: , Rfl:  .  Flaxseed, Linseed, (FLAX SEED OIL PO), Take 1 capsule by mouth daily., Disp: , Rfl:  .  folic acid (FOLVITE) 1 MG tablet, TAKE (1) TABLET BY MOUTH ONCE DAILY., Disp: 30 tablet, Rfl: 4 .  GARLIC OIL PO, Take 1 capsule by mouth daily., Disp: , Rfl:  .  glipiZIDE (GLUCOTROL) 5 MG tablet, Take 5 mg by mouth daily. , Disp: , Rfl:  .  insulin aspart (NOVOLOG) 100 UNIT/ML injection, Inject 0-6 Units into the skin as needed. , Disp: , Rfl:  .  levothyroxine (SYNTHROID, LEVOTHROID) 50 MCG tablet, Take 50 mcg by mouth daily., Disp: , Rfl:  .  Magnesium 250 MG TABS, Take 1 tablet by mouth daily., Disp: , Rfl:  .  metFORMIN (GLUCOPHAGE) 500 MG tablet, Take 500 mg by mouth daily with breakfast., Disp: , Rfl:  .  metoprolol succinate (TOPROL-XL) 25 MG 24 hr tablet, Take 25 mg by mouth daily.  , Disp: , Rfl:  .  Misc Natural Products (PROSTATE CONTROL PO), Take 1 tablet by mouth daily., Disp: , Rfl:  .  Multiple Vitamin (MULTIVITAMIN) tablet, Take 1 tablet by mouth daily., Disp: , Rfl:  .  Multiple Vitamins-Minerals (MULTI FOR HIM 50+) TABS, Take by mouth., Disp: , Rfl:  .  Omega 3-6-9 CAPS, Take by mouth., Disp: , Rfl:  .  POTASSIUM PO, Take by mouth., Disp: , Rfl:  .  predniSONE (DELTASONE) 10 MG tablet, Take 0.5 tablets (5 mg total) by mouth daily.,  Disp: 30 tablet, Rfl: 2 .  psyllium (METAMUCIL) 58.6 % powder, Take 1 packet by mouth daily. , Disp: , Rfl:  .  Red Yeast Rice 600 MG TABS, Take by mouth., Disp: , Rfl:  .  Saw Palmetto 160 MG CAPS, Take by mouth., Disp: , Rfl:  .  vitamin B-12 (CYANOCOBALAMIN) 1000 MCG tablet, Take 1,000 mcg by mouth daily., Disp: , Rfl:  .  Zn-Pyg Afri-Nettle-Saw Palmet (SAW PALMETTO COMPLEX PO), Take 2 capsules by mouth 2 (two) times daily., Disp: , Rfl:   Allergies Patient has no known allergies.  Review of Systems   Review of Systems - Oncology ROS negative other than bruise on right leg.     Physical Exam  Vitals Wt Readings from Last 3 Encounters:  11/17/17 242 lb 8 oz (110 kg)  07/09/17 240 lb 1.6 oz (108.9 kg)  05/07/17 240 lb (108.9 kg)   Temp Readings from Last 3 Encounters:  11/17/17 98 F (36.7 C) (Oral)  07/09/17 97.7 F (36.5 C) (Oral)  03/12/17 97.8 F (36.6 C) (Oral)   BP Readings from Last 3 Encounters:  11/17/17 (!) 129/57  07/09/17 (!) 144/76  05/07/17 138/78   Pulse Readings from Last 3 Encounters:  11/17/17 77  07/09/17 78  05/07/17 71   Constitutional: Well-developed, well-nourished, and in no distress.   HENT: Head: Normocephalic and atraumatic.  Mouth/Throat: No oropharyngeal exudate. Mucosa moist. Eyes: Pupils are equal, round, and reactive to light. Conjunctivae are normal. No scleral icterus.  Neck: Normal range of motion. Neck supple. No JVD present.  Cardiovascular: Normal rate, regular rhythm and normal heart sounds.  Exam reveals no gallop and no friction rub.   No murmur heard. Pulmonary/Chest: Effort normal and breath sounds normal. No respiratory distress. No wheezes.No rales.  Abdominal: Soft. Bowel sounds are normal. No distension. There is no tenderness. There is no guarding.  Musculoskeletal: Bruise noted on medial aspect of right leg.   Lymphadenopathy: No cervical, axillary or supraclavicular adenopathy.  Neurological: Alert and oriented to  person, place, and time. No cranial nerve deficit.  Skin: Skin is warm and dry. No rash noted. No erythema. No pallor.  Psychiatric: Affect and judgment normal.   Labs No visits with results within 3 Day(s) from this visit.  Latest known visit with results is:  Appointment on 11/10/2017  Component Date Value Ref Range Status  . WBC 11/10/2017 9.4  4.0 - 10.5 K/uL Final  . RBC 11/10/2017 4.08* 4.22 - 5.81 MIL/uL Final  . Hemoglobin 11/10/2017 13.5  13.0 - 17.0 g/dL Final  . HCT 11/10/2017 40.4  39.0 - 52.0 % Final  . MCV 11/10/2017 99.0  78.0 - 100.0 fL Final  . MCH 11/10/2017 33.1  26.0 - 34.0 pg Final  . MCHC 11/10/2017 33.4  30.0 - 36.0 g/dL Final  . RDW 11/10/2017 13.5  11.5 - 15.5 % Final  . Platelets 11/10/2017 215  150 - 400 K/uL Final  . Neutrophils Relative % 11/10/2017 84  % Final  . Neutro Abs 11/10/2017 8.0* 1.7 - 7.7 K/uL Final  . Lymphocytes Relative 11/10/2017 9  % Final  . Lymphs Abs 11/10/2017 0.8  0.7 - 4.0 K/uL Final  . Monocytes Relative 11/10/2017 5  % Final  . Monocytes Absolute 11/10/2017 0.5  0.1 - 1.0 K/uL Final  . Eosinophils Relative 11/10/2017 1  % Final  . Eosinophils Absolute 11/10/2017 0.1  0.0 - 0.7 K/uL Final  . Basophils Relative 11/10/2017 1  % Final  . Basophils Absolute 11/10/2017 0.1  0.0 - 0.1 K/uL Final   Performed at [facility], [address] [place], [place] [address]  . Sodium 11/10/2017 138  135 - 145 mmol/L Final  . Potassium 11/10/2017 4.2  3.5 - 5.1 mmol/L Final  . Chloride 11/10/2017 106  98 - 111 mmol/L Final  . CO2 11/10/2017 23  22 - 32 mmol/L Final  . Glucose, Bld 11/10/2017 182* 70 - 99 mg/dL Final  . BUN 11/10/2017 19  8 - 23 mg/dL Final  . Creatinine, Ser 11/10/2017 1.08  0.61 - 1.24 mg/dL Final  . Calcium 11/10/2017  8.8* 8.9 - 10.3 mg/dL Final  . Total Protein 11/10/2017 7.1  6.5 - 8.1 g/dL Final  . Albumin 11/10/2017 3.9  3.5 - 5.0 g/dL Final  . AST 11/10/2017 30  15 - 41 U/L Final  . ALT 11/10/2017 33  0 - 44 U/L  Final  . Alkaline Phosphatase 11/10/2017 86  38 - 126 U/L Final  . Total Bilirubin 11/10/2017 1.4* 0.3 - 1.2 mg/dL Final  . GFR calc non Af Amer 11/10/2017 >60  >60 mL/min Final  . GFR calc Af Amer 11/10/2017 >60  >60 mL/min Final   Comment: (NOTE) The eGFR has been calculated using the CKD EPI equation. This calculation has not been validated in all clinical situations. eGFR's persistently <60 mL/min signify possible Chronic Kidney Disease.   . Anion gap 11/10/2017 9  5 - 15 Final   Performed at [facility], [address] [place], [place] [address]  . LDH 11/10/2017 266* 98 - 192 U/L Final   Performed at [facility], [address] [place], [place] [address]  . Haptoglobin 11/10/2017 <10* 34 - 200 mg/dL Final   Comment: (NOTE) Performed At: [facility] [place] [address] [place], [place] [id number] Nagendra Sanjai MD Ph:[phone]      Pathology Orders Placed This Encounter  Procedures  . CBC with Differential/Platelet    Standing Status:   Future    Standing Expiration Date:   11/18/2019  . Comprehensive metabolic panel    Standing Status:   Future    Standing Expiration Date:   11/18/2019  . Lactate dehydrogenase    Standing Status:   Future    Standing Expiration Date:   11/18/2019       Vetta Higgs MD 

## 2018-02-12 ENCOUNTER — Inpatient Hospital Stay (HOSPITAL_COMMUNITY): Payer: Medicare Other | Attending: Hematology

## 2018-02-12 DIAGNOSIS — Z79899 Other long term (current) drug therapy: Secondary | ICD-10-CM | POA: Diagnosis not present

## 2018-02-12 DIAGNOSIS — E099 Drug or chemical induced diabetes mellitus without complications: Secondary | ICD-10-CM | POA: Insufficient documentation

## 2018-02-12 DIAGNOSIS — Z87891 Personal history of nicotine dependence: Secondary | ICD-10-CM | POA: Diagnosis not present

## 2018-02-12 DIAGNOSIS — D591 Autoimmune hemolytic anemia, unspecified: Secondary | ICD-10-CM

## 2018-02-12 DIAGNOSIS — Z7982 Long term (current) use of aspirin: Secondary | ICD-10-CM | POA: Diagnosis not present

## 2018-02-12 DIAGNOSIS — I10 Essential (primary) hypertension: Secondary | ICD-10-CM | POA: Diagnosis not present

## 2018-02-12 DIAGNOSIS — Z794 Long term (current) use of insulin: Secondary | ICD-10-CM | POA: Insufficient documentation

## 2018-02-12 LAB — COMPREHENSIVE METABOLIC PANEL
ALK PHOS: 103 U/L (ref 38–126)
ALT: 48 U/L — ABNORMAL HIGH (ref 0–44)
ANION GAP: 7 (ref 5–15)
AST: 42 U/L — AB (ref 15–41)
Albumin: 4 g/dL (ref 3.5–5.0)
BILIRUBIN TOTAL: 1.6 mg/dL — AB (ref 0.3–1.2)
BUN: 17 mg/dL (ref 8–23)
CALCIUM: 9 mg/dL (ref 8.9–10.3)
CO2: 28 mmol/L (ref 22–32)
Chloride: 103 mmol/L (ref 98–111)
Creatinine, Ser: 1.01 mg/dL (ref 0.61–1.24)
GFR calc Af Amer: >60 mL/min (ref >60)
GFR calc non Af Amer: >60 mL/min (ref >60)
Glucose, Bld: 165 mg/dL — ABNORMAL HIGH (ref 70–99)
Potassium: 4.1 mmol/L (ref 3.5–5.1)
SODIUM: 138 mmol/L (ref 135–145)
TOTAL PROTEIN: 7 g/dL (ref 6.5–8.1)

## 2018-02-12 LAB — CBC WITH DIFFERENTIAL/PLATELET
Abs Immature Granulocytes: 0.02 10*3/uL (ref 0.00–0.07)
BASOS ABS: 0.1 10*3/uL (ref 0.0–0.1)
Basophils Relative: 1 %
Eosinophils Absolute: 0.2 10*3/uL (ref 0.0–0.5)
Eosinophils Relative: 3 %
HEMATOCRIT: 42.3 % (ref 39.0–52.0)
HEMOGLOBIN: 13.3 g/dL (ref 13.0–17.0)
Immature Granulocytes: 0 %
LYMPHS ABS: 0.8 10*3/uL (ref 0.7–4.0)
LYMPHS PCT: 9 %
MCH: 31.4 pg (ref 26.0–34.0)
MCHC: 31.4 g/dL (ref 30.0–36.0)
MCV: 100 fL (ref 80.0–100.0)
Monocytes Absolute: 0.7 10*3/uL (ref 0.1–1.0)
Monocytes Relative: 8 %
NEUTROS ABS: 6.8 10*3/uL (ref 1.7–7.7)
NRBC: 0 % (ref 0.0–0.2)
Neutrophils Relative %: 79 %
Platelets: 224 10*3/uL (ref 150–400)
RBC: 4.23 MIL/uL (ref 4.22–5.81)
RDW: 13.2 % (ref 11.5–15.5)
WBC: 8.6 10*3/uL (ref 4.0–10.5)

## 2018-02-12 LAB — LACTATE DEHYDROGENASE: LDH: 277 U/L — ABNORMAL HIGH (ref 98–192)

## 2018-02-19 ENCOUNTER — Encounter (HOSPITAL_COMMUNITY): Payer: Self-pay | Admitting: Hematology

## 2018-02-19 ENCOUNTER — Inpatient Hospital Stay (HOSPITAL_BASED_OUTPATIENT_CLINIC_OR_DEPARTMENT_OTHER): Payer: Medicare Other | Admitting: Hematology

## 2018-02-19 VITALS — BP 145/78 | HR 79 | Temp 97.6°F | Resp 18 | Wt 242.0 lb

## 2018-02-19 DIAGNOSIS — I10 Essential (primary) hypertension: Secondary | ICD-10-CM | POA: Diagnosis not present

## 2018-02-19 DIAGNOSIS — E099 Drug or chemical induced diabetes mellitus without complications: Secondary | ICD-10-CM | POA: Diagnosis not present

## 2018-02-19 DIAGNOSIS — D591 Autoimmune hemolytic anemia, unspecified: Secondary | ICD-10-CM

## 2018-02-19 DIAGNOSIS — Z79899 Other long term (current) drug therapy: Secondary | ICD-10-CM

## 2018-02-19 DIAGNOSIS — Z87891 Personal history of nicotine dependence: Secondary | ICD-10-CM | POA: Diagnosis not present

## 2018-02-19 DIAGNOSIS — Z7982 Long term (current) use of aspirin: Secondary | ICD-10-CM

## 2018-02-19 DIAGNOSIS — Z7984 Long term (current) use of oral hypoglycemic drugs: Secondary | ICD-10-CM

## 2018-02-19 NOTE — Patient Instructions (Signed)
[facility] at [facility] Discharge Instructions  Follow up in 3 months with labs prior to visit.    Thank you for choosing [facility] at [facility] to provide your oncology and hematology care.  To afford each patient quality time with our provider, please arrive at least 15 minutes before your scheduled appointment time.   If you have a lab appointment with the Cancer Center please come in thru the  Main Entrance and check in at the main information desk  You need to re-schedule your appointment should you arrive 10 or more minutes late.  We strive to give you quality time with our providers, and arriving late affects you and other patients whose appointments are after yours.  Also, if you no show three or more times for appointments you may be dismissed from the clinic at the providers discretion.     Again, thank you for choosing [facility].  Our hope is that these requests will decrease the amount of time that you wait before being seen by our physicians.       _____________________________________________________________  Should you have questions after your visit to [facility], please contact our office at [phone] between the hours of 8:00 a.m. and 4:30 p.m.  Voicemails left after 4:00 p.m. will not be returned until the following business day.  For prescription refill requests, have your pharmacy contact our office and allow 72 hours.    Cancer Center Support Programs:   > Cancer Support Group  2nd Tuesday of the month 1pm-2pm, Journey Room

## 2018-02-19 NOTE — Assessment & Plan Note (Signed)
1.  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 - Treated with rituximab weekly x4 in June 2015. - He was also evaluated by Dr. Elta Guadeloupe at [facility]. - He is currently on prednisone 5 mg daily.  Most recent taper was in May 2019 from 10 mg daily. - Tolerating prednisone very well.  We reviewed his blood work.  Hemoglobin is staying around 13.  LDH is mildly elevated. -I will continue prednisone 5 mg daily at this time.  We plan to repeat blood work in 3 months.  I might consider tapering it to 5 mg every other day if his blood counts remain stable.  2.  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  He is on glipizide and metformin. -He uses insulin as needed.

## 2018-02-19 NOTE — Progress Notes (Signed)
[facility] [address], [place] [address]   CLINIC:  Medical Oncology/Hematology  PCP:  [name], MD [address] [place] [address] [phone]   REASON FOR VISIT: Follow-up for autoimmune hemolytic anemia  CURRENT THERAPY: prednisone   INTERVAL HISTORY:  [name] Page 73 y.o. male returns for routine follow-up for autoimmune hemolytic anemia. He has been doing good with no complaints at this time. Denies any nausea, vomiting, or diarrhea. Denies any new pains. Had not noticed any recent bleeding such as epistaxis, hematuria or hematochezia. Denies recent chest pain on exertion, shortness of breath on minimal exertion, pre-syncopal episodes, or palpitations. Denies any numbness or tingling in hands or feet. Denies any recent fevers, infections, or recent hospitalizations. Denies any night sweats, chills, or unexplained weight loss. He reports his appetite and energy level at 100%.    REVIEW OF SYSTEMS:  Review of Systems  All other systems reviewed and are negative.    PAST MEDICAL/SURGICAL HISTORY:  Past Medical History:  Diagnosis Date  . Coronary atherosclerosis of native coronary artery    Multivessel, LVEF 59%  . Essential hypertension, benign   . Hemolytic anemia (HCC)   . 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 Hyperlipidemia   . Pernicious anemia    Past Surgical History:  Procedure Laterality Date  . CORONARY ARTERY BYPASS GRAFT  2004   LIMA to LAD, SVG to OM, SVG to PDA     SOCIAL HISTORY:  Social History   Socioeconomic History  . Marital status: Married    Spouse name: Not on file  . Number of children: Not on file  . Years of education: Not on file  . Highest education level: Not on file  Occupational History  . Occupation: Full time  Social Needs  . Financial resource strain: Not on file  . Food insecurity:    Worry: Not on file    Inability: Not on file  . Transportation needs:    Medical: Not  on file    Non-medical: Not on file  Tobacco Use  . Smoking status: Former Smoker    Types: Cigarettes    Last attempt to quit: 02/11/2002    Years since quitting: 16.0  . Smokeless tobacco: Never Used  Substance and Sexual Activity  . Alcohol use: No    Alcohol/week: 0.0 standard drinks  . Drug use: No  . Sexual activity: Not on file  Lifestyle  . Physical activity:    Days per week: Not on file    Minutes per session: Not on file  . Stress: Not on file  Relationships  . Social connections:    Talks on phone: Not on file    Gets together: Not on file    Attends religious service: Not on file    Active member of club or organization: Not on file    Attends meetings of clubs or organizations: Not on file    Relationship status: Not on file  . Intimate partner violence:    Fear of current or ex partner: Not on file    Emotionally abused: Not on file    Physically abused: Not on file    Forced sexual activity: Not on file  Other Topics Concern  . Not on file  Social History Narrative  . Not on file    FAMILY HISTORY:  Family History  Problem Relation Age of Onset  . Dementia Mother   . Cancer Mother  colon  . COPD Father   . Ulcerative colitis Father   . Heart attack Father     CURRENT MEDICATIONS:  Outpatient Encounter Medications as of 02/19/2018  Medication Sig Note  . APPLE CIDER VINEGAR PO Take 30 mLs by mouth daily.   Marland Kitchen. aspirin EC 81 MG tablet Take 81 mg by mouth daily.   . Aspirin-Acetaminophen-Caffeine (GOODYS EXTRA STRENGTH) [phone] MG PACK Take by mouth. 09/01/2015: [facility]  . b complex vitamins tablet Take 1 tablet by mouth daily.   . calcium carbonate (OS-CAL) 600 MG TABS tablet Take 600 mg by mouth daily with breakfast.   . calcium citrate-vitamin D (CITRACAL+D) 315-200 MG-UNIT tablet Take by mouth. 09/01/2015: Received from: [facility]  . Cranberry 500 MG CAPS Take by mouth. 09/01/2015: Received from: [facility]  . Flaxseed, Linseed, (FLAX SEED OIL PO) Take 1 capsule by mouth daily.   . folic acid (FOLVITE) 1 MG tablet TAKE (1) TABLET BY MOUTH ONCE DAILY.   Marland Kitchen. GARLIC OIL PO Take 1 capsule by mouth daily.   Marland Kitchen. glipiZIDE (GLUCOTROL) 5 MG tablet Take 5 mg by mouth daily.    . insulin aspart (NOVOLOG) 100 UNIT/ML injection Inject 0-6 Units into the skin as needed.    Marland Kitchen. levothyroxine (SYNTHROID, LEVOTHROID) 50 MCG tablet Take 50 mcg by mouth daily.   . Magnesium 250 MG TABS Take 1 tablet by mouth daily.   . metFORMIN (GLUCOPHAGE) 500 MG tablet Take 500 mg by mouth daily with breakfast.   . metoprolol succinate (TOPROL-XL) 25 MG 24 hr tablet Take 25 mg by mouth daily.     . Misc Natural Products (PROSTATE CONTROL PO) Take 1 tablet by mouth daily.   . Multiple Vitamins-Minerals (MULTI FOR HIM 50+) TABS Take by mouth. 09/01/2015: Received from: [facility]  . Omega 3-6-9 CAPS Take by mouth. 09/01/2015: Received from: [facility]  . POTASSIUM PO Take by mouth.   . predniSONE (DELTASONE) 5 MG tablet Take 1 tablet (5 mg total) by mouth daily with breakfast.   . psyllium (METAMUCIL) 58.6 % powder Take 1 packet by mouth daily.    . Red Yeast Rice 600 MG TABS Take by mouth. 09/01/2015: Received from: [facility]  . Saw Palmetto 160 MG CAPS Take by mouth. 09[facility]01/2015: [facility]  . vitamin B-12 (CYANOCOBALAMIN) 1000 MCG tablet Take 1,000 mcg by mouth daily.   . [DISCONTINUED] Fish Oil-Cholecalciferol (FISH OIL + D3 PO) Take 1 capsule by mouth daily.   . [DISCONTINUED] Multiple Vitamin (MULTIVITAMIN) tablet Take 1 tablet by mouth daily.   . [DISCONTINUED] Zn-Pyg Afri-Nettle-Saw Palmet (SAW PALMETTO COMPLEX PO) Take 2 capsules by mouth 2 (two) times daily.    No facility-administered encounter medications on file as of 02/19/2018.     ALLERGIES:  No Known Allergies   PHYSICAL EXAM:  ECOG Performance status: 1  Vitals:   02/19/18 0802  BP: (!) 145/78  Pulse: 79  Resp:  18  Temp: 97.6 F (36.4 C)  SpO2: 99%   Filed Weights   02/19/18 0802  Weight: 242 lb (109.8 kg)    Physical Exam Constitutional:      Appearance: Normal appearance. He is normal weight.  Abdominal:     General: Abdomen is flat.     Palpations: Abdomen is soft.  Musculoskeletal: Normal range of motion.  Skin:    General: Skin is warm and dry.  Neurological:     Mental Status: He is  alert and oriented to person, place, and time. Mental status is at baseline.  Psychiatric:        Mood and Affect: Mood normal.        Behavior: Behavior normal.        Thought Content: Thought content normal.        Judgment: Judgment normal.   Abdomen: No palpable hepatosplenomegaly. Lymphadenopathy: No palpable neck, axillary or inguinal adenopathy. Extremities: 1+ edema bilaterally.   LABORATORY DATA:  I have reviewed the labs as listed.  CBC    Component Value Date/Time   WBC 8.6 02/12/2018 0948   RBC 4.23 02/12/2018 0948   HGB 13.3 02/12/2018 0948   HCT 42.3 02/12/2018 0948   PLT 224 02/12/2018 0948   MCV 100.0 02/12/2018 0948   MCH 31.4 02/12/2018 0948   MCHC 31.4 02/12/2018 0948   RDW 13.2 02/12/2018 0948   LYMPHSABS 0.8 02/12/2018 0948   MONOABS 0.7 02/12/2018 0948   EOSABS 0.2 02/12/2018 0948   BASOSABS 0.1 02/12/2018 0948   CMP Latest Ref Rng & Units 02/12/2018 11/10/2017 08/19/2017  Glucose 70 - 99 mg/dL 161(W165(H) 960(A182(H) 540(J139(H)  BUN 8 - 23 mg/dL 17 19 16   Creatinine 0.61 - 1.24 mg/dL 8.111.01 9.141.08 7.821.12  Sodium 135 - 145 mmol/L 138 138 139  Potassium 3.5 - 5.1 mmol/L 4.1 4.2 4.1  Chloride 98 - 111 mmol/L 103 106 105  CO2 22 - 32 mmol/L 28 23 27   Calcium 8.9 - 10.3 mg/dL 9.0 9.5(A8.8(L) 8.9  Total Protein 6.5 - 8.1 g/dL 7.0 7.1 6.9  Total Bilirubin 0.3 - 1.2 mg/dL 2.1(H1.6(H) 0.8(M1.4(H) 5.7(Q1.6(H)  Alkaline Phos 38 - 126 U/L 103 86 88  AST 15 - 41 U/L 42(H) 30 38  ALT 0 - 44 U/L 48(H) 33 30       DIAGNOSTIC IMAGING:  I have independently reviewed the scans and discussed with the  patient.   I have reviewed Mathis Budandi Marcheta Horsey, NP's note and agree with the documentation.  I personally performed a face-to-face visit, made revisions and my assessment and plan is as follows.    ASSESSMENT & PLAN:   Autoimmune hemolytic anemia (HCC) 1.  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 - Treated with rituximab weekly x4 in June 2015. - He was [facility] - He is currently on prednisone 5 mg daily.  Most recent taper was in May 2019 from 10 mg daily. - Tolerating prednisone very well.  We reviewed his blood work.  Hemoglobin is staying around 13.  LDH is mildly elevated. -I will continue prednisone 5 mg daily at this time.  We plan to repeat blood work in 3 months.  I might consider tapering it to 5 mg every other day if his blood counts remain stable.  2.  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  He is on glipizide and metformin. -He uses insulin as needed.      Orders placed this encounter:  Orders Placed This Encounter  Procedures  . Reticulocytes  . CBC with Differential/Platelet  . Comprehensive metabolic panel  . Lactate dehydrogenase  . Haptoglobin  . Bilirubin, direct      Doreatha MassedSreedhar Katragadda, MD [facility] [phone]

## 2018-02-27 ENCOUNTER — Other Ambulatory Visit (HOSPITAL_COMMUNITY): Payer: Self-pay | Admitting: Internal Medicine

## 2018-02-27 DIAGNOSIS — D591 Autoimmune hemolytic anemia, unspecified: Secondary | ICD-10-CM

## 2018-05-01 ENCOUNTER — Other Ambulatory Visit (HOSPITAL_COMMUNITY): Payer: Self-pay | Admitting: Internal Medicine

## 2018-05-01 DIAGNOSIS — D591 Autoimmune hemolytic anemia, unspecified: Secondary | ICD-10-CM

## 2018-05-01 NOTE — Telephone Encounter (Signed)
[name] Page patient.

## 2018-05-05 ENCOUNTER — Telehealth: Payer: Self-pay | Admitting: *Deleted

## 2018-05-05 NOTE — Telephone Encounter (Signed)
   Cardiac Questionnaire:    Since your last visit or hospitalization:    1. Have you been having new or worsening chest pain? No   2. Have you been having new or worsening shortness of breath? No 3. Have you been having new or worsening leg swelling, wt gain, or increase in abdominal girth (pants fitting more tightly)? No   4. Have you had any passing out spells? No    Given the current situation regarding the worldwide coronarvirus pandemic, at the recommendation of the CDC, we are looking to limit gatherings in our waiting area, and thus will reschedule their appointment beyond four weeks from today.     

## 2018-05-06 ENCOUNTER — Ambulatory Visit: Payer: Medicare Other | Admitting: Cardiology

## 2018-06-03 ENCOUNTER — Other Ambulatory Visit (HOSPITAL_COMMUNITY): Payer: Self-pay | Admitting: Hematology

## 2018-06-03 DIAGNOSIS — D591 Autoimmune hemolytic anemia, unspecified: Secondary | ICD-10-CM

## 2018-06-04 ENCOUNTER — Other Ambulatory Visit (HOSPITAL_COMMUNITY): Payer: Medicare Other

## 2018-06-11 ENCOUNTER — Ambulatory Visit (HOSPITAL_COMMUNITY): Payer: Medicare Other | Admitting: Hematology

## 2018-07-01 ENCOUNTER — Other Ambulatory Visit (HOSPITAL_COMMUNITY): Payer: Self-pay | Admitting: Hematology

## 2018-07-01 DIAGNOSIS — D591 Autoimmune hemolytic anemia, unspecified: Secondary | ICD-10-CM

## 2018-07-02 ENCOUNTER — Inpatient Hospital Stay (HOSPITAL_COMMUNITY): Payer: Medicare Other | Attending: Hematology

## 2018-07-02 ENCOUNTER — Other Ambulatory Visit: Payer: Self-pay

## 2018-07-02 DIAGNOSIS — D591 Autoimmune hemolytic anemia, unspecified: Secondary | ICD-10-CM

## 2018-07-02 DIAGNOSIS — Z79899 Other long term (current) drug therapy: Secondary | ICD-10-CM | POA: Insufficient documentation

## 2018-07-02 DIAGNOSIS — E119 Type 2 diabetes mellitus without complications: Secondary | ICD-10-CM | POA: Insufficient documentation

## 2018-07-02 LAB — RETICULOCYTES
Immature Retic Fract: 15.1 % (ref 2.3–15.9)
RBC.: 3.94 MIL/uL — ABNORMAL LOW (ref 4.22–5.81)
Retic Count, Absolute: 236.4 10*3/uL — ABNORMAL HIGH (ref 19.0–186.0)
Retic Ct Pct: 6 % — ABNORMAL HIGH (ref 0.4–3.1)

## 2018-07-02 LAB — CBC WITH DIFFERENTIAL/PLATELET
Abs Immature Granulocytes: 0.04 10*3/uL (ref 0.00–0.07)
Basophils Absolute: 0.1 10*3/uL (ref 0.0–0.1)
Basophils Relative: 1 %
Eosinophils Absolute: 0.1 10*3/uL (ref 0.0–0.5)
Eosinophils Relative: 2 %
HCT: 41.1 % (ref 39.0–52.0)
Hemoglobin: 13.6 g/dL (ref 13.0–17.0)
Immature Granulocytes: 1 %
Lymphocytes Relative: 8 %
Lymphs Abs: 0.7 10*3/uL (ref 0.7–4.0)
MCH: 34.5 pg — ABNORMAL HIGH (ref 26.0–34.0)
MCHC: 33.1 g/dL (ref 30.0–36.0)
MCV: 104.3 fL — ABNORMAL HIGH (ref 80.0–100.0)
Monocytes Absolute: 0.6 10*3/uL (ref 0.1–1.0)
Monocytes Relative: 7 %
Neutro Abs: 7 10*3/uL (ref 1.7–7.7)
Neutrophils Relative %: 81 %
Platelets: 262 10*3/uL (ref 150–400)
RBC: 3.94 MIL/uL — ABNORMAL LOW (ref 4.22–5.81)
RDW: 13.5 % (ref 11.5–15.5)
WBC: 8.5 10*3/uL (ref 4.0–10.5)
nRBC: 0 % (ref 0.0–0.2)

## 2018-07-02 LAB — COMPREHENSIVE METABOLIC PANEL
ALT: 36 U/L (ref 0–44)
AST: 29 U/L (ref 15–41)
Albumin: 4 g/dL (ref 3.5–5.0)
Alkaline Phosphatase: 80 U/L (ref 38–126)
Anion gap: 9 (ref 5–15)
BUN: 17 mg/dL (ref 8–23)
CO2: 28 mmol/L (ref 22–32)
Calcium: 9.3 mg/dL (ref 8.9–10.3)
Chloride: 103 mmol/L (ref 98–111)
Creatinine, Ser: 1.08 mg/dL (ref 0.61–1.24)
GFR calc Af Amer: >60 mL/min (ref >60)
GFR calc non Af Amer: >60 mL/min (ref >60)
Glucose, Bld: 162 mg/dL — ABNORMAL HIGH (ref 70–99)
Potassium: 4.5 mmol/L (ref 3.5–5.1)
Sodium: 140 mmol/L (ref 135–145)
Total Bilirubin: 1.8 mg/dL — ABNORMAL HIGH (ref 0.3–1.2)
Total Protein: 7.1 g/dL (ref 6.5–8.1)

## 2018-07-02 LAB — LACTATE DEHYDROGENASE: LDH: 257 U/L — ABNORMAL HIGH (ref 98–192)

## 2018-07-02 LAB — BILIRUBIN, DIRECT: Bilirubin, Direct: 0.3 mg/dL — ABNORMAL HIGH (ref 0.0–0.2)

## 2018-07-03 LAB — HAPTOGLOBIN: Haptoglobin: <10 mg/dL — ABNORMAL LOW (ref 34–355)

## 2018-07-09 ENCOUNTER — Inpatient Hospital Stay (HOSPITAL_BASED_OUTPATIENT_CLINIC_OR_DEPARTMENT_OTHER): Payer: Medicare Other | Admitting: Hematology

## 2018-07-09 ENCOUNTER — Encounter (HOSPITAL_COMMUNITY): Payer: Self-pay | Admitting: Hematology

## 2018-07-09 ENCOUNTER — Other Ambulatory Visit: Payer: Self-pay

## 2018-07-09 VITALS — BP 147/71 | HR 98 | Temp 97.5°F | Resp 18 | Wt 239.4 lb

## 2018-07-09 DIAGNOSIS — E119 Type 2 diabetes mellitus without complications: Secondary | ICD-10-CM | POA: Diagnosis not present

## 2018-07-09 DIAGNOSIS — D591 Autoimmune hemolytic anemia, unspecified: Secondary | ICD-10-CM

## 2018-07-09 DIAGNOSIS — Z79899 Other long term (current) drug therapy: Secondary | ICD-10-CM | POA: Diagnosis not present

## 2018-07-09 NOTE — Patient Instructions (Signed)
[facility] Discharge Instructions  You were seen today by Dr. Katragadda. He went over your recent lab results. He will see you back in  for labs and follow up.   Thank you for choosing [facility] at [facility] to provide your oncology and hematology care.  To afford each patient quality time with our provider, please arrive at least 15 minutes before your scheduled appointment time.   If you have a lab appointment with the Cancer Center please come in thru the  Main Entrance and check in at the main information desk  You need to re-schedule your appointment should you arrive 10 or more minutes late.  We strive to give you quality time with our providers, and arriving late affects you and other patients whose appointments are after yours.  Also, if you no show three or more times for appointments you may be dismissed from the clinic at the providers discretion.     Again, thank you for choosing [facility].  Our hope is that these requests will decrease the amount of time that you wait before being seen by our physicians.       _____________________________________________________________  Should you have questions after your visit to [facility], please contact our office at [phone] between the hours of 8:00 a.m. and 4:30 p.m.  Voicemails left after 4:00 p.m. will not be returned until the following business day.  For prescription refill requests, have your pharmacy contact our office and allow 72 hours.    Cancer Center Support Programs:   > Cancer Support Group  2nd Tuesday of the month 1pm-2pm, Journey Room    

## 2018-07-09 NOTE — Assessment & Plan Note (Addendum)
1.  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 - Treated with rituximab weekly x4 in June 2015. - He is currently on prednisone 5 mg daily.  Most recent taper was in May 2019 from 10 mg daily. - Tolerating prednisone very well.  We reviewed his blood work.  Hemoglobin is staying around 13.  LDH is mildly elevated. - Have recommended to taper Prednisone to 5 mg qod. We will continue to monitor lab work. - RTC in 2 mths.   2.  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  He is on glipizide and metformin. -He uses insulin as needed.

## 2018-07-09 NOTE — Progress Notes (Signed)
[name] Page, [place] [address]   CLINIC:  Medical Oncology/Hematology  PCP:  Glenda Chroman, MD [place] [address] [phone]   REASON FOR VISIT:  Follow-up for autoimmune hemolytic anemia    INTERVAL HISTORY:  [name] Page 73 y.o. male returns for routine follow-up. He is here today alone. He states that he continues to have numbness in both feet, but it is no worse. He states that he has swelling in his legs at times. Denies any nausea, vomiting, or diarrhea. Denies any new pains. Had not noticed any recent bleeding such as epistaxis, hematuria or hematochezia. Denies recent chest pain on exertion, shortness of breath on minimal exertion, pre-syncopal episodes, or palpitations. Denies any numbness or tingling in hands. Denies any recent fevers, infections, or recent hospitalizations. Patient reports appetite at 100% and energy level at 50%.    REVIEW OF SYSTEMS:  Review of Systems  Cardiovascular: Positive for leg swelling.  Neurological: Positive for numbness.  All other systems reviewed and are negative.    PAST MEDICAL/SURGICAL HISTORY:  Past Medical History:  Diagnosis Date  . Coronary atherosclerosis of native coronary artery    Multivessel, LVEF 59%  . Essential hypertension, benign   . Hemolytic anemia ([place])   . 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 Hyperlipidemia   . Pernicious anemia    Past Surgical History:  Procedure Laterality Date  . CORONARY ARTERY BYPASS GRAFT  2004   LIMA to LAD, SVG to OM, SVG to PDA     SOCIAL HISTORY:  Social History   Socioeconomic History  . Marital status: Married    Spouse name: Not on file  . Number of children: Not on file  . Years of education: Not on file  . Highest education level: Not on file  Occupational History  . Occupation: Full time  Social Needs  . Financial resource strain: Not on file  . Food insecurity:    Worry: Not on file   Inability: Not on file  . Transportation needs:    Medical: Not on file    Non-medical: Not on file  Tobacco Use  . Smoking status: Former Smoker    Types: Cigarettes    Last attempt to quit: 02/11/2002    Years since quitting: 16.4  . Smokeless tobacco: Never Used  Substance and Sexual Activity  . Alcohol use: No    Alcohol/week: 0.0 standard drinks  . Drug use: No  . Sexual activity: Not on file  Lifestyle  . Physical activity:    Days per week: Not on file    Minutes per session: Not on file  . Stress: Not on file  Relationships  . Social connections:    Talks on phone: Not on file    Gets together: Not on file    Attends religious service: Not on file    Active member of club or organization: Not on file    Attends meetings of clubs or organizations: Not on file    Relationship status: Not on file  . Intimate partner violence:    Fear of current or ex partner: Not on file    Emotionally abused: Not on file    Physically abused: Not on file    Forced sexual activity: Not on file  Other Topics Concern  . Not on file  Social History Narrative  . Not on file    FAMILY HISTORY:  Family History  Problem Relation Age of Onset  . Dementia Mother   . Cancer Mother        colon  . COPD Father   . Ulcerative colitis Father   . Heart attack Father     CURRENT MEDICATIONS:  Outpatient Encounter Medications as of 07/09/2018  Medication Sig Note  . APPLE CIDER VINEGAR PO Take 30 mLs by mouth daily.   Marland Kitchen aspirin EC 81 MG tablet Take 81 mg by mouth daily.   . Aspirin-Acetaminophen-Caffeine (GOODYS EXTRA STRENGTH) [phone] MG PACK Take by mouth. 09/01/2015: Received from: [place]  . b complex vitamins tablet Take 1 tablet by mouth daily.   . calcium carbonate (OS-CAL) 600 MG TABS tablet Take 600 mg by mouth daily with breakfast.   . calcium citrate-vitamin D (CITRACAL+D) 315-200 MG-UNIT tablet Take by mouth. 09/01/2015: Received from: [facility]  .  Cranberry 500 MG CAPS Take by mouth. 09/01/2015: Received from: [facility]  . Flaxseed, Linseed, (FLAX SEED OIL PO) Take 1 capsule by mouth daily.   . folic acid (FOLVITE) 1 MG tablet TAKE 1 TABLET BY MOUTH ONCE DAILY.   Marland Kitchen GARLIC OIL PO Take 1 capsule by mouth daily.   Marland Kitchen glipiZIDE (GLUCOTROL) 5 MG tablet Take 5 mg by mouth daily.    . insulin aspart (NOVOLOG) 100 UNIT/ML injection Inject 0-6 Units into the skin as needed.    Marland Kitchen levothyroxine (SYNTHROID, LEVOTHROID) 50 MCG tablet Take 50 mcg by mouth daily.   . Magnesium 250 MG TABS Take 1 tablet by mouth daily.   . metFORMIN (GLUCOPHAGE) 500 MG tablet Take 500 mg by mouth daily with breakfast.   . metoprolol succinate (TOPROL-XL) 25 MG 24 hr tablet Take 25 mg by mouth daily.     . Misc Natural Products (PROSTATE CONTROL PO) Take 1 tablet by mouth daily.   . Multiple Vitamins-Minerals (MULTI FOR HIM 50+) TABS Take by mouth. 09/01/2015: Received from: [facility]  . Omega 3-6-9 CAPS Take by mouth. 09/01/2015: Received from: [place]  . ONETOUCH VERIO test strip    . pioglitazone-metformin (ACTOPLUS MET) 15-500 MG tablet    . POTASSIUM PO Take by mouth.   . predniSONE (DELTASONE) 5 MG tablet Take 1 tablet (5 mg total) by mouth daily with breakfast.   . psyllium (METAMUCIL) 58.6 % powder Take 1 packet by mouth daily.    . Red Yeast Rice 600 MG TABS Take by mouth. 09/01/2015: Received from: [place]  . Saw Palmetto 160 MG CAPS Take by mouth. 09/01/2015: Received from: [facility]  . vitamin B-12 (CYANOCOBALAMIN) 1000 MCG tablet Take 1,000 mcg by mouth daily.    No facility-administered encounter medications on file as of 07/09/2018.     ALLERGIES:  No Known Allergies   PHYSICAL EXAM:  ECOG Performance status: 1  Vitals:   07/09/18 1107  BP: (!) 147/71  Pulse: 98  Resp: 18  Temp: (!) 97.5 F (36.4 C)  SpO2: 98%   Filed Weights   07/09/18 1107  Weight: 239 lb 6.4 oz (108.6 kg)    Physical Exam Vitals  signs reviewed.  Constitutional:      Appearance: Normal appearance.  Cardiovascular:     Rate and Rhythm: Normal rate and regular rhythm.     Heart sounds: Normal heart sounds.  Pulmonary:     Breath sounds: Normal breath sounds.  Abdominal:     General: There is no distension.     Palpations: Abdomen is soft. There  is no mass.  Musculoskeletal:        General: Swelling present.  Skin:    General: Skin is warm.  Neurological:     General: No focal deficit present.     Mental Status: He is alert and oriented to person, place, and time.  Psychiatric:        Mood and Affect: Mood normal.        Behavior: Behavior normal.      LABORATORY DATA:  I have reviewed the labs as listed.  CBC    Component Value Date/Time   WBC 8.5 07/02/2018 1109   RBC 3.94 (L) 07/02/2018 1109   RBC 3.94 (L) 07/02/2018 1109   HGB 13.6 07/02/2018 1109   HCT 41.1 07/02/2018 1109   PLT 262 07/02/2018 1109   MCV 104.3 (H) 07/02/2018 1109   MCH 34.5 (H) 07/02/2018 1109   MCHC 33.1 07/02/2018 1109   RDW 13.5 07/02/2018 1109   LYMPHSABS 0.7 07/02/2018 1109   MONOABS 0.6 07/02/2018 1109   EOSABS 0.1 07/02/2018 1109   BASOSABS 0.1 07/02/2018 1109   CMP Latest Ref Rng & Units 07/02/2018 02/12/2018 11/10/2017  Glucose 70 - 99 mg/dL 162(H) 165(H) 182(H)  BUN 8 - 23 mg/dL 17 17 19   Creatinine 0.61 - 1.24 mg/dL 1.08 1.01 1.08  Sodium 135 - 145 mmol/L 140 138 138  Potassium 3.5 - 5.1 mmol/L 4.5 4.1 4.2  Chloride 98 - 111 mmol/L 103 103 106  CO2 22 - 32 mmol/L 28 28 23   Calcium 8.9 - 10.3 mg/dL 9.3 9.0 8.8(L)  Total Protein 6.5 - 8.1 g/dL 7.1 7.0 7.1  Total Bilirubin 0.3 - 1.2 mg/dL 1.8(H) 1.6(H) 1.4(H)  Alkaline Phos 38 - 126 U/L 80 103 86  AST 15 - 41 U/L 29 42(H) 30  ALT 0 - 44 U/L 36 48(H) 33       DIAGNOSTIC IMAGING:  I have independently reviewed the scans and discussed with the patient.   I have reviewed Venita Lick LPN's note and agree with the documentation.  I personally performed a  face-to-face visit, made revisions and my assessment and plan is as follows.    ASSESSMENT & PLAN:   Autoimmune hemolytic anemia ([place]) 1.  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 - Treated with rituximab weekly x4 in June 2015. - He is currently on prednisone 5 mg daily.  Most recent taper was in May 2019 from 10 mg daily. - Tolerating prednisone very well.  We reviewed his blood work.  Hemoglobin is staying around 13.  LDH is mildly elevated. - Have recommended to taper Prednisone to 5 mg qod. We will continue to monitor lab work. - RTC in 2 mths.   2.  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  He is on glipizide and metformin. -He uses insulin as needed.   Total time spent is 25 minutes with more than 50% of the time spent face-to-face discussing treatment plan and coordination of care.    Orders placed this encounter:  Orders Placed This Encounter  Procedures  . CBC with Differential      [name] Jack, MD [place] [phone]

## 2018-07-10 ENCOUNTER — Encounter (HOSPITAL_COMMUNITY): Payer: Self-pay | Admitting: Hematology

## 2018-07-14 ENCOUNTER — Telehealth: Payer: Self-pay | Admitting: Cardiology

## 2018-07-14 NOTE — Telephone Encounter (Signed)
Virtual Visit Pre-Appointment Phone Call  "(Name), I am calling you today to discuss your upcoming appointment. We are currently trying to limit exposure to the virus that causes COVID-19 by seeing patients at home rather than in the office."  1. "What is the BEST phone number to call the day of the visit?" - include this in appointment notes  2. Do you have or have access to (through a family member/friend) a smartphone with video capability that we can use for your visit?" a. If yes - list this number in appt notes as cell (if different from BEST phone #) and list the appointment type as a VIDEO visit in appointment notes b. If no - list the appointment type as a PHONE visit in appointment notes  3. Confirm consent - "In the setting of the current Covid19 crisis, you are scheduled for a (phone or video) visit with your provider on (date) at (time).  Just as we do with many in-office visits, in order for you to participate in this visit, we must obtain consent.  If you'd like, I can send this to your mychart (if signed up) or email for you to review.  Otherwise, I can obtain your verbal consent now.  All virtual visits are billed to your insurance company just like a normal visit would be.  By agreeing to a virtual visit, we'd like you to understand that the technology does not allow for your provider to perform an examination, and thus may limit your provider's ability to fully assess your condition. If your provider identifies any concerns that need to be evaluated in person, we will make arrangements to do so.  Finally, though the technology is pretty good, we cannot assure that it will always work on either your or our end, and in the setting of a video visit, we may have to convert it to a phone-only visit.  In either situation, we cannot ensure that we have a secure connection.  Are you willing to proceed?" STAFF: Did the patient verbally acknowledge consent to telehealth visit? Document  YES/NO here: yes  4. Advise patient to be prepared - "Two hours prior to your appointment, go ahead and check your blood pressure, pulse, oxygen saturation, and your weight (if you have the equipment to check those) and write them all down. When your visit starts, your provider will ask you for this information. If you have an Apple Watch or Kardia device, please plan to have heart rate information ready on the day of your appointment. Please have a pen and paper handy nearby the day of the visit as well."  5. Give patient instructions for MyChart download to smartphone OR [email or web address] as below if video visit (depending on what platform provider is using)  6. Inform patient they will receive a phone call 15 minutes prior to their appointment time (may be from unknown caller ID) so they should be prepared to answer    TELEPHONE CALL NOTE  [name] Page has been deemed a candidate for a follow-up tele-health visit to limit community exposure during the Covid-19 pandemic. I spoke with the patient via phone to ensure availability of phone/video source, confirm preferred email & phone number, and discuss instructions and expectations.  I reminded [name] SimmeringJoseph W Page to be prepared with any vital sign and/or heart rhythm information that could potentially be obtained via home monitoring, at the time of his visit. I reminded [name] SimmeringJoseph W Page to expect a phone call prior to  his visit.  Geraldine Contras 07/14/2018 3:04 PM   INSTRUCTIONS FOR DOWNLOADING THE MYCHART APP TO SMARTPHONE  - The patient must first make sure to have activated MyChart and know their login information - If Apple, go to [employer] and type in MyChart in the search bar and download the app. If Android, ask patient to go to [employer] and type in [place] in the search bar and download the app. The app is free but as with any other app downloads, their phone may require them to verify saved payment information or  Apple/Android password.  - The patient will need to then log into the app with their MyChart username and password, and select [facility] as their healthcare provider to link the account. When it is time for your visit, go to the MyChart app, find appointments, and click Begin Video Visit. Be sure to Select Allow for your device to access the Microphone and Camera for your visit. You will then be connected, and your provider will be with you shortly.  **If they have any issues connecting, or need assistance please contact MyChart service desk [phone])**  **If using a computer, in order to ensure the best quality for their visit they will need to use either of the following Internet Browsers: [employer] or Google Chrome**  IF USING DOXIMITY or DOXY.ME - The patient will receive a link just prior to their visit by text.     FULL LENGTH CONSENT FOR TELE-HEALTH VISIT   I hereby voluntarily request, consent and authorize [facility] and its employed or contracted physicians, physician assistants, nurse practitioners or other licensed health care professionals (the Practitioner), to provide me with telemedicine health care services (the Services") as deemed necessary by the treating Practitioner. I acknowledge and consent to receive the Services by the Practitioner via telemedicine. I understand that the telemedicine visit will involve communicating with the Practitioner through live audiovisual communication technology and the disclosure of certain medical information by electronic transmission. I acknowledge that I have been given the opportunity to request an in-person assessment or other available alternative prior to the telemedicine visit and am voluntarily participating in the telemedicine visit.  I understand that I have the right to withhold or withdraw my consent to the use of telemedicine in the course of my care at any time, without affecting my right to future care  or treatment, and that the Practitioner or I may terminate the telemedicine visit at any time. I understand that I have the right to inspect all information obtained and/or recorded in the course of the telemedicine visit and may receive copies of available information for a reasonable fee.  I understand that some of the potential risks of receiving the Services via telemedicine include:   Delay or interruption in medical evaluation due to technological equipment failure or disruption;  Information transmitted may not be sufficient (e.g. poor resolution of images) to allow for appropriate medical decision making by the Practitioner; and/or   In rare instances, security protocols could fail, causing a breach of personal health information.  Furthermore, I acknowledge that it is my responsibility to provide information about my medical history, conditions and care that is complete and accurate to the best of my ability. I acknowledge that Practitioner's advice, recommendations, and/or decision may be based on factors not within their control, such as incomplete or inaccurate data provided by me or distortions of diagnostic images or specimens that may result from electronic transmissions. I  understand that the practice of medicine is not an exact science and that Practitioner makes no warranties or guarantees regarding treatment outcomes. I acknowledge that I will receive a copy of this consent concurrently upon execution via email to the email address I last provided but may also request a printed copy by calling the office of [facility].    I understand that my insurance will be billed for this visit.   I have read or had this consent read to me.  I understand the contents of this consent, which adequately explains the benefits and risks of the Services being provided via telemedicine.   I have been provided ample opportunity to ask questions regarding this consent and the Services and have had  my questions answered to my satisfaction.  I give my informed consent for the services to be provided through the use of telemedicine in my medical care  By participating in this telemedicine visit I agree to the above.

## 2018-07-17 ENCOUNTER — Telehealth: Payer: Self-pay | Admitting: Cardiology

## 2018-07-17 ENCOUNTER — Telehealth (INDEPENDENT_AMBULATORY_CARE_PROVIDER_SITE_OTHER): Payer: Medicare Other | Admitting: Cardiology

## 2018-07-17 ENCOUNTER — Encounter: Payer: Self-pay | Admitting: Cardiology

## 2018-07-17 ENCOUNTER — Encounter: Payer: Self-pay | Admitting: *Deleted

## 2018-07-17 VITALS — BP 140/71 | Ht 73.0 in | Wt 240.0 lb

## 2018-07-17 DIAGNOSIS — Z7189 Other specified counseling: Secondary | ICD-10-CM

## 2018-07-17 DIAGNOSIS — I25119 Atherosclerotic heart disease of native coronary artery with unspecified angina pectoris: Secondary | ICD-10-CM

## 2018-07-17 DIAGNOSIS — E782 Mixed hyperlipidemia: Secondary | ICD-10-CM

## 2018-07-17 DIAGNOSIS — D591 Autoimmune hemolytic anemia, unspecified: Secondary | ICD-10-CM

## 2018-07-17 DIAGNOSIS — I10 Essential (primary) hypertension: Secondary | ICD-10-CM | POA: Diagnosis not present

## 2018-07-17 NOTE — Patient Instructions (Addendum)
Medication Instructions:   Your physician recommends that you continue on your current medications as directed. Please refer to the Current Medication list given to you today.  Labwork:  NONE  Testing/Procedures: Your physician has requested that you have a lexiscan myoview. For further information please visit [email or web address]. Please follow instruction sheet, as given.  Follow-Up:  Your physician recommends that you schedule a follow-up appointment in: 1 year. You will receive a reminder letter in the mail in about 10 months reminding you to call and schedule your appointment. If you don't receive this letter, please contact our office.  Any Other Special Instructions Will Be Listed Below (If Applicable).  If you need a refill on your cardiac medications before your next appointment, please call your pharmacy. 

## 2018-07-17 NOTE — Progress Notes (Signed)
Virtual Visit via Telephone Note   This visit type was conducted due to national recommendations for restrictions regarding the COVID-19 Pandemic (e.g. social distancing) in an effort to limit this patient's exposure and mitigate transmission in our community.  Due to his co-morbid illnesses, this patient is at least at moderate risk for complications without adequate follow up.  This format is felt to be most appropriate for this patient at this time.  The patient did not have access to video technology/had technical difficulties with video requiring transitioning to audio format only (telephone).  All issues noted in this document were discussed and addressed.  No physical exam could be performed with this format.  Please refer to the patient's chart for his  consent to telehealth for [facility].   Date:  07/17/2018   ID:  VANDELL KUN, DOB [birth year], MRN [id number]  Patient Location: Other:  Work Provider Location: Office  PCP:  Glenda Chroman, MD  Cardiologist:  Rozann Lesches, MD Electrophysiologist:  None   Evaluation Performed:  Follow-Up Visit  Chief Complaint:   Cardiac follow-up  History of Present Illness:    [name] Page is a 73 y.o. male last seen in March 2019.  He did not have video access and we spoke by phone today.  He does not report any active angina at this time.  Has a chronic sense of fatigue which has not changed.  He attributes a lot of this to his history of hemolytic anemia and treatments.  Interestingly, he does tell me that his steroid dose was just cut back by hematologist, he went from 10 mg daily to 5 mg daily and then to 2.5 mg daily fairly quickly and started to feel poorly.  Describes diarrhea and malaise, although somewhat better since yesterday.  He went back up to 5 mg daily and I asked him to talk with his hematologist.  He otherwise has not had any fevers or chills, has checked his temperature with a thermometer, no cough or chest  congestion.  We did discuss follow-up ischemic evaluation.  He had a cardiac catheterization back in 2014.  I went over his current cardiac regimen which includes aspirin, Toprol-XL, omega-3 supplements, and red yeast rice.  He has a history of statin intolerance.  Still working full-time as an Optometrist, states that the recent tax season was not quite as busy.  The patient does not have symptoms concerning for COVID-19 infection (fever, chills, cough, or new shortness of breath).    Past Medical History:  Diagnosis Date  . Coronary atherosclerosis of native coronary artery    Multivessel, LVEF 59%  . Essential hypertension, benign   . Hemolytic anemia ([place])   . Hyperlipidemia   . Pernicious anemia    Past Surgical History:  Procedure Laterality Date  . CORONARY ARTERY BYPASS GRAFT  2004   LIMA to LAD, SVG to OM, SVG to PDA     Current Meds  Medication Sig  . APPLE CIDER VINEGAR PO Take 30 mLs by mouth daily.  Marland Kitchen aspirin EC 81 MG tablet Take 81 mg by mouth daily.  . Aspirin-Acetaminophen-Caffeine (GOODYS EXTRA STRENGTH) [phone] MG PACK Take by mouth daily as needed.   Marland Kitchen b complex vitamins tablet Take 1 tablet by mouth daily.  . calcium carbonate (OS-CAL) 600 MG TABS tablet Take 600 mg by mouth 2 (two) times daily with a meal.   . Cranberry 500 MG CAPS Take 2 capsules by mouth daily.   . folic  acid (FOLVITE) 1 MG tablet TAKE 1 TABLET BY MOUTH ONCE DAILY.  Marland Kitchen GARLIC OIL PO Take 1 capsule by mouth daily.  Marland Kitchen glipiZIDE (GLUCOTROL) 5 MG tablet Take 5 mg by mouth daily.   . insulin aspart (NOVOLOG) 100 UNIT/ML injection Inject 0-6 Units into the skin as needed.   Marland Kitchen levothyroxine (SYNTHROID, LEVOTHROID) 50 MCG tablet Take 50 mcg by mouth daily.  . Magnesium 250 MG TABS Take 1 tablet by mouth daily.  . metoprolol succinate (TOPROL-XL) 25 MG 24 hr tablet Take 25 mg by mouth daily.    . Multiple Vitamins-Minerals (MULTI FOR HIM 50+) TABS Take 1 tablet by mouth daily.   Ernestine Conrad 3-6-9  CAPS Take 1 capsule by mouth daily.   . pioglitazone-metformin (ACTOPLUS MET) 15-500 MG tablet Take 1 tablet by mouth daily.   Marland Kitchen POTASSIUM PO Take 99 mg by mouth daily.   . prednisoLONE 5 MG TABS tablet Take 5 mg by mouth daily.  . psyllium (METAMUCIL) 58.6 % powder Take 1 packet by mouth daily.   . Red Yeast Rice 600 MG TABS Take 1 tablet by mouth daily.   . Saw Palmetto 160 MG CAPS Take 1 capsule by mouth daily.   . vitamin B-12 (CYANOCOBALAMIN) 1000 MCG tablet Take 1,000 mcg by mouth daily.     Allergies:   Patient has no known allergies.   Social History   Tobacco Use  . Smoking status: Former Smoker    Types: Cigarettes    Last attempt to quit: 02/11/2002    Years since quitting: 16.4  . Smokeless tobacco: Never Used  Substance Use Topics  . Alcohol use: No    Alcohol/week: 0.0 standard drinks  . Drug use: No     Family Hx: The patient's family history includes COPD in his father; Cancer in his mother; Dementia in his mother; Heart attack in his father; Ulcerative colitis in his father.  ROS:   Please see the history of present illness. All other systems reviewed and are negative.   Prior CV studies:   The following studies were reviewed today:  Cardiac catheterization 09/30/2012: Coronary angiography: Coronary dominance: right  Left mainstem: Widely patent with mild irregularity but no significant stenosis. Divides into the LAD and left circumflex.  Left anterior descending (LAD): There is moderately tight 70% proximal LAD stenosis. The first diagonal branch is patent. The mid LAD fills competitively from the mammary artery.  Left circumflex (LCx): There is a small intermediate branch. The AV groove circumflex is patent. The mid AV circumflex has a 60% stenosis. After the second obtuse marginal, the distal AV circumflex is subtotally occluded with TIMI 1 flow beyond the stenosis.  Right coronary artery (RCA): Severely diseased throughout the proximal RCA. The mid  vessel is totally occluded.  Saphenous vein graft to obtuse marginal is widely patent with no significant stenosis. The obtuse marginal branches have no significant disease beyond the graft insertion site.  Saphenous vein graft to PDA is widely patent with no significant stenosis. The PDA has no significant disease.  LIMA to LAD: Widely patent throughout. The LAD beyond the LIMA insertion site is patent without significant disease it reaches the left ventricular apex.  Left ventriculography: There is hypokinesis of the inferior wall. The other LV segments contract normally. The estimated left ventricular ejection fraction is 55%.  Final Conclusions:  1. Severe three-vessel native coronary artery disease 2. Status post aortocoronary bypass surgery with continued patency of all bypass grafts 3. Mild segmental left  ventricular systolic dysfunction.  Labs/Other Tests and Data Reviewed:    EKG:  An ECG dated 05/07/2017 was personally reviewed today and demonstrated:  Sinus rhythm with PVC and old inferolateral infarct pattern.  Recent Labs: 07/02/2018: ALT 36; BUN 17; Creatinine, Ser 1.08; Hemoglobin 13.6; Platelets 262; Potassium 4.5; Sodium 140    Wt Readings from Last 3 Encounters:  07/17/18 240 lb (108.9 kg)  07/09/18 239 lb 6.4 oz (108.6 kg)  02/19/18 242 lb (109.8 kg)     Objective:    Vital Signs:  BP 140/71   Ht _0  (1.854 m)   Wt 240 lb (108.9 kg)   BMI 31.66 kg/m    Patient spoke in full sentences, not short of breath. No audible wheezing or coughing. Speech pattern normal.  ASSESSMENT & PLAN:    1.  Multivessel CAD status post CABG in 2004.  Bypass grafts were patent as of 2014.  Will continue medical therapy as outlined above, also discussed follow-up surveillance stress testing and will arrange a Lexiscan Myoview later this month.  2.  Mixed hyperlipidemia with known statin intolerance.  He takes omega-3 supplements and red yeast rice.  Keep follow-up with  PCP.  3.  Essential hypertension, recent systolic 030.  Continue Toprol-XL.  4.  Hemolytic anemia, recent hemoglobin 13.6.  He follows regularly with Hematology.  COVID-19 Education: The signs and symptoms of COVID-19 were discussed with the patient and how to seek care for testing (follow up with PCP or arrange E-visit).  The importance of social distancing was discussed today.  Time:   Today, I have spent 12 minutes with the patient with telehealth technology discussing the above problems.     Medication Adjustments/Labs and Tests Ordered: Current medicines are reviewed at length with the patient today.  Concerns regarding medicines are outlined above.   Tests Ordered: Orders Placed This Encounter  Procedures  . NM Myocar Multi W/Spect W/Wall Motion / EF    Medication Changes: No orders of the defined types were placed in this encounter.   Disposition:  Follow up 1 year in the [place] office.  Signed, Rozann Lesches, MD  07/17/2018 11:45 AM    [place]

## 2018-07-17 NOTE — Telephone Encounter (Signed)
°  Precert needed for: Lexiscan Myoview on meds dx: CAD w/unspecified angina (schedule for end of June 2020)    Location: [facility]    Date: August 10, 2018

## 2018-07-21 ENCOUNTER — Other Ambulatory Visit (HOSPITAL_COMMUNITY): Payer: Self-pay | Admitting: *Deleted

## 2018-08-04 ENCOUNTER — Other Ambulatory Visit (HOSPITAL_COMMUNITY): Payer: Self-pay | Admitting: Hematology

## 2018-08-04 DIAGNOSIS — D591 Autoimmune hemolytic anemia, unspecified: Secondary | ICD-10-CM

## 2018-08-10 ENCOUNTER — Encounter (HOSPITAL_COMMUNITY)
Admission: RE | Admit: 2018-08-10 | Discharge: 2018-08-10 | Disposition: A | Discharge status: Home / Self Care | Payer: Medicare Other | Source: Ambulatory Visit | Attending: Cardiology | Admitting: Cardiology

## 2018-08-10 ENCOUNTER — Other Ambulatory Visit: Payer: Self-pay

## 2018-08-10 ENCOUNTER — Ambulatory Visit (HOSPITAL_COMMUNITY)
Admission: RE | Admit: 2018-08-10 | Discharge: 2018-08-10 | Disposition: A | Discharge status: Home / Self Care | Payer: Medicare Other | Source: Ambulatory Visit | Attending: Cardiology | Admitting: Cardiology

## 2018-08-10 ENCOUNTER — Telehealth: Payer: Self-pay | Admitting: *Deleted

## 2018-08-10 DIAGNOSIS — I25119 Atherosclerotic heart disease of native coronary artery with unspecified angina pectoris: Secondary | ICD-10-CM | POA: Diagnosis not present

## 2018-08-10 LAB — NM MYOCAR MULTI W/SPECT W/WALL MOTION / EF
LV dias vol: 94 mL (ref 62–150)
LV sys vol: 39 mL
Peak HR: 109 {beats}/min
RATE: 0.37
Rest HR: 88 {beats}/min
SDS: 2
SRS: 7
SSS: 9
TID: 0.94

## 2018-08-10 MED ORDER — SODIUM CHLORIDE 0.9% FLUSH
INTRAVENOUS | Status: AC
  Administered 2018-08-10: 11:00:00 10 mL via INTRAVENOUS
  Filled 2018-08-10: qty 10

## 2018-08-10 MED ORDER — TECHNETIUM TC 99M TETROFOSMIN IV KIT
10.0000 | PACK | Freq: Once | INTRAVENOUS | Status: AC | PRN
  Administered 2018-08-10: 10:00:00 11 via INTRAVENOUS

## 2018-08-10 MED ORDER — REGADENOSON 0.4 MG/5ML IV SOLN
INTRAVENOUS | Status: AC
  Administered 2018-08-10: 0.4 mg via INTRAVENOUS
  Filled 2018-08-10: qty 5

## 2018-08-10 MED ORDER — TECHNETIUM TC 99M TETROFOSMIN IV KIT
30.0000 | PACK | Freq: Once | INTRAVENOUS | Status: AC | PRN
  Administered 2018-08-10: 11:00:00 32.3 via INTRAVENOUS

## 2018-08-10 NOTE — Telephone Encounter (Signed)
Patient informed. Copy sent to PCP °

## 2018-08-10 NOTE — Telephone Encounter (Signed)
-----   Message from Satira Sark, MD sent at 08/10/2018  2:17 PM EDT ----- Results reviewed.  Please let him know that the follow-up stress test suggest stable findings, previous infarct scar noted but no active ischemia.  Would continue with medical therapy and current follow-up plan unless symptoms intervene.

## 2018-09-08 ENCOUNTER — Inpatient Hospital Stay (HOSPITAL_COMMUNITY): Payer: Medicare Other | Attending: Hematology

## 2018-09-08 ENCOUNTER — Other Ambulatory Visit: Payer: Self-pay

## 2018-09-08 DIAGNOSIS — D591 Autoimmune hemolytic anemia, unspecified: Secondary | ICD-10-CM

## 2018-09-08 DIAGNOSIS — E119 Type 2 diabetes mellitus without complications: Secondary | ICD-10-CM | POA: Diagnosis not present

## 2018-09-08 DIAGNOSIS — Z79899 Other long term (current) drug therapy: Secondary | ICD-10-CM | POA: Diagnosis not present

## 2018-09-08 LAB — COMPREHENSIVE METABOLIC PANEL
ALT: 38 U/L (ref 0–44)
AST: 31 U/L (ref 15–41)
Albumin: 3.7 g/dL (ref 3.5–5.0)
Alkaline Phosphatase: 99 U/L (ref 38–126)
Anion gap: 8 (ref 5–15)
BUN: 18 mg/dL (ref 8–23)
CO2: 27 mmol/L (ref 22–32)
Calcium: 9.3 mg/dL (ref 8.9–10.3)
Chloride: 103 mmol/L (ref 98–111)
Creatinine, Ser: 1.16 mg/dL (ref 0.61–1.24)
GFR calc Af Amer: >60 mL/min (ref >60)
GFR calc non Af Amer: >60 mL/min (ref >60)
Glucose, Bld: 205 mg/dL — ABNORMAL HIGH (ref 70–99)
Potassium: 4.3 mmol/L (ref 3.5–5.1)
Sodium: 138 mmol/L (ref 135–145)
Total Bilirubin: 1.5 mg/dL — ABNORMAL HIGH (ref 0.3–1.2)
Total Protein: 6.6 g/dL (ref 6.5–8.1)

## 2018-09-08 LAB — CBC WITH DIFFERENTIAL/PLATELET
Abs Immature Granulocytes: 0.03 10*3/uL (ref 0.00–0.07)
Basophils Absolute: 0.1 10*3/uL (ref 0.0–0.1)
Basophils Relative: 1 %
Eosinophils Absolute: 0.2 10*3/uL (ref 0.0–0.5)
Eosinophils Relative: 2 %
HCT: 41.2 % (ref 39.0–52.0)
Hemoglobin: 13.3 g/dL (ref 13.0–17.0)
Immature Granulocytes: 0 %
Lymphocytes Relative: 6 %
Lymphs Abs: 0.7 10*3/uL (ref 0.7–4.0)
MCH: 32.3 pg (ref 26.0–34.0)
MCHC: 32.3 g/dL (ref 30.0–36.0)
MCV: 100 fL (ref 80.0–100.0)
Monocytes Absolute: 0.9 10*3/uL (ref 0.1–1.0)
Monocytes Relative: 8 %
Neutro Abs: 9.7 10*3/uL — ABNORMAL HIGH (ref 1.7–7.7)
Neutrophils Relative %: 83 %
Platelets: 274 10*3/uL (ref 150–400)
RBC: 4.12 MIL/uL — ABNORMAL LOW (ref 4.22–5.81)
RDW: 13.1 % (ref 11.5–15.5)
WBC: 11.7 10*3/uL — ABNORMAL HIGH (ref 4.0–10.5)
nRBC: 0 % (ref 0.0–0.2)

## 2018-09-08 LAB — LACTATE DEHYDROGENASE: LDH: 250 U/L — ABNORMAL HIGH (ref 98–192)

## 2018-09-09 LAB — HAPTOGLOBIN: Haptoglobin: <10 mg/dL — ABNORMAL LOW (ref 34–355)

## 2018-09-15 ENCOUNTER — Inpatient Hospital Stay (HOSPITAL_COMMUNITY): Payer: Medicare Other | Attending: Hematology | Admitting: Hematology

## 2018-09-15 ENCOUNTER — Encounter (HOSPITAL_COMMUNITY): Payer: Self-pay | Admitting: Hematology

## 2018-09-15 ENCOUNTER — Other Ambulatory Visit: Payer: Self-pay

## 2018-09-15 VITALS — BP 144/58 | HR 81 | Temp 97.8°F | Resp 14 | Wt 238.6 lb

## 2018-09-15 DIAGNOSIS — Z79899 Other long term (current) drug therapy: Secondary | ICD-10-CM | POA: Insufficient documentation

## 2018-09-15 DIAGNOSIS — E119 Type 2 diabetes mellitus without complications: Secondary | ICD-10-CM | POA: Insufficient documentation

## 2018-09-15 DIAGNOSIS — D591 Autoimmune hemolytic anemia, unspecified: Secondary | ICD-10-CM

## 2018-09-15 DIAGNOSIS — Z794 Long term (current) use of insulin: Secondary | ICD-10-CM | POA: Insufficient documentation

## 2018-09-15 NOTE — Patient Instructions (Signed)
[facility] at [facility] Discharge Instructions  You were seen today by Dr. Katragadda. He went over your recent lab results. He will see you back in 3 months for labs and follow up.   Thank you for choosing [facility] at [facility] to provide your oncology and hematology care.  To afford each patient quality time with our provider, please arrive at least 15 minutes before your scheduled appointment time.   If you have a lab appointment with the Cancer Center please come in thru the  Main Entrance and check in at the main information desk  You need to re-schedule your appointment should you arrive 10 or more minutes late.  We strive to give you quality time with our providers, and arriving late affects you and other patients whose appointments are after yours.  Also, if you no show three or more times for appointments you may be dismissed from the clinic at the providers discretion.     Again, thank you for choosing [facility].  Our hope is that these requests will decrease the amount of time that you wait before being seen by our physicians.       _____________________________________________________________  Should you have questions after your visit to [facility], please contact our office at [phone] between the hours of 8:00 a.m. and 4:30 p.m.  Voicemails left after 4:00 p.m. will not be returned until the following business day.  For prescription refill requests, have your pharmacy contact our office and allow 72 hours.    Cancer Center Support Programs:   > Cancer Support Group  2nd Tuesday of the month 1pm-2pm, Journey Room    

## 2018-09-15 NOTE — Assessment & Plan Note (Signed)
1.  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 - Rituximab weekly x4 in June 2015. - He is on prednisone 5 mg daily, most recent taper was in May 2019 from 10 mg daily. - He was told to further taper it to 5 mg every other day.  He started taking it on 07/10/2018 but developed diarrhea for 1 week.  He even tried taking 2.5 mg once daily. - Because of COVID-19 and the diarrhea, he did not want to taper it yet.  He is right now continuing prednisone 5 mg daily. -We reviewed his blood work which showed stable hemoglobin, LDH.  Haptoglobin is low.  Total bilirubin is stable. - We will continue at the same dose level at this time.  He wants to taper it once the COVID-19 resolves. -We will see him back in 3 months for follow-up.  2.  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  He is on Actos and metformin.  He takes insulin as needed.

## 2018-09-15 NOTE — Progress Notes (Signed)
[facility] [place], [place] [address]   CLINIC:  Medical Oncology/Hematology  PCP:  Glenda Chroman, MD [place] [address] [phone]   REASON FOR VISIT:  Follow-up for autoimmune hemolytic anemia    INTERVAL HISTORY:  Mr. Lebow 73 y.o. male seen for follow-up of autoimmune hemolytic anemia.  He was seen by our nurse practitioner last visit on 07/09/2018.  He was told to taper prednisone to 5 mg every other day.  He tried it for a week but had diarrhea.  After the diarrhea, he started taking 5 mg every day.  Denies any fevers, night sweats or weight loss.  Denies any ER visits or hospitalizations.  Appetite is 75%.  Energy levels are 50%.  No pain reported.  Denies any nausea, vomiting, diarrhea or constipation.  Denies any bleeding per rectum or melena.    REVIEW OF SYSTEMS:  Review of Systems  Neurological: Negative for numbness.  All other systems reviewed and are negative.    PAST MEDICAL/SURGICAL HISTORY:  Past Medical History:  Diagnosis Date  . Coronary atherosclerosis of native coronary artery    Multivessel, LVEF 59%  . Essential hypertension, benign   . Hemolytic anemia ([place])   . Hyperlipidemia   . Pernicious anemia    Past Surgical History:  Procedure Laterality Date  . CORONARY ARTERY BYPASS GRAFT  2004   LIMA to LAD, SVG to OM, SVG to PDA     SOCIAL HISTORY:  Social History   Socioeconomic History  . Marital status: Married    Spouse name: Not on file  . Number of children: Not on file  . Years of education: Not on file  . Highest education level: Not on file  Occupational History  . Occupation: Full time  Social Needs  . Financial resource strain: Not on file  . Food insecurity    Worry: Not on file    Inability: Not on file  . Transportation needs    Medical: Not on file    Non-medical: Not on file  Tobacco Use  . Smoking status: Former Smoker    Types: Cigarettes    Quit date: 02/11/2002   Years since quitting: 16.6  . Smokeless tobacco: Never Used  Substance and Sexual Activity  . Alcohol use: No    Alcohol/week: 0.0 standard drinks  . Drug use: No  . Sexual activity: Not on file  Lifestyle  . Physical activity    Days per week: Not on file    Minutes per session: Not on file  . Stress: Not on file  Relationships  . Social Herbalist on phone: Not on file    Gets together: Not on file    Attends religious service: Not on file    Active member of club or organization: Not on file    Attends meetings of clubs or organizations: Not on file    Relationship status: Not on file  . Intimate partner violence    Fear of current or ex partner: Not on file    Emotionally abused: Not on file    Physically abused: Not on file    Forced sexual activity: Not on file  Other Topics Concern  . Not on file  Social History Narrative  . Not on file    FAMILY HISTORY:  Family History  Problem Relation Age of Onset  . Dementia Mother   . Cancer Mother  colon  . COPD Father   . Ulcerative colitis Father   . Heart attack Father     CURRENT MEDICATIONS:  Outpatient Encounter Medications as of 09/15/2018  Medication Sig Note  . APPLE CIDER VINEGAR PO Take 30 mLs by mouth daily.   Marland Kitchen aspirin EC 81 MG tablet Take 81 mg by mouth daily.   . Aspirin-Acetaminophen-Caffeine (GOODYS EXTRA STRENGTH) [phone] MG PACK Take by mouth daily as needed.  09/01/2015: Received from: [facility]  . b complex vitamins tablet Take 1 tablet by mouth daily.   . calcium carbonate (OS-CAL) 600 MG TABS tablet Take 600 mg by mouth 2 (two) times daily with a meal.    . Cranberry 500 MG CAPS Take 2 capsules by mouth daily.  09/01/2015: Received from: [facility]  . folic acid (FOLVITE) 1 MG tablet TAKE 1 TABLET ONCE DAILY.   Marland Kitchen GARLIC OIL PO Take 1 capsule by mouth daily.   Marland Kitchen glipiZIDE (GLUCOTROL) 5 MG tablet Take 5 mg by mouth daily.    . insulin aspart (NOVOLOG) 100  UNIT/ML injection Inject 0-6 Units into the skin as needed.    Marland Kitchen levothyroxine (SYNTHROID, LEVOTHROID) 50 MCG tablet Take 50 mcg by mouth daily.   . Magnesium 250 MG TABS Take 1 tablet by mouth daily.   . metFORMIN (GLUCOPHAGE) 500 MG tablet    . metoprolol succinate (TOPROL-XL) 25 MG 24 hr tablet Take 25 mg by mouth daily.     . Misc Natural Products (PROSTATE CONTROL PO) Take 1 tablet by mouth daily.   . Multiple Vitamins-Minerals (MULTI FOR HIM 50+) TABS Take 1 tablet by mouth daily.  09/01/2015: Received from: [facility]  . Omega 3-6-9 CAPS Take 1 capsule by mouth daily.  09/01/2015: Received from: [facility]  . ONETOUCH VERIO test strip    . pioglitazone (ACTOS) 15 MG tablet    . POTASSIUM PO Take 99 mg by mouth daily.    . prednisoLONE 5 MG TABS tablet Take 5 mg by mouth daily.   . psyllium (METAMUCIL) 58.6 % powder Take 1 packet by mouth daily.    . Red Yeast Rice 600 MG TABS Take 1 tablet by mouth daily.  09/01/2015: Received from: [place]  . Saw Palmetto 160 MG CAPS Take 1 capsule by mouth daily.  09/01/2015: Received from: [facility]  . vitamin B-12 (CYANOCOBALAMIN) 1000 MCG tablet Take 1,000 mcg by mouth daily.   . [DISCONTINUED] pioglitazone-metformin (ACTOPLUS MET) 15-500 MG tablet Take 1 tablet by mouth daily.    Marland Kitchen dicyclomine (BENTYL) 10 MG capsule     No facility-administered encounter medications on file as of 09/15/2018.     ALLERGIES:  No Known Allergies   PHYSICAL EXAM:  ECOG Performance status: 1  Vitals:   09/15/18 1205  BP: (!) 144/58  Pulse: 81  Resp: 14  Temp: 97.8 F (36.6 C)  SpO2: 98%   Filed Weights   09/15/18 1205  Weight: 238 lb 9.6 oz (108.2 kg)    Physical Exam Vitals signs reviewed.  Constitutional:      Appearance: Normal appearance.  Cardiovascular:     Rate and Rhythm: Normal rate and regular rhythm.     Heart sounds: Normal heart sounds.  Pulmonary:     Breath sounds: Normal breath sounds.  Abdominal:      General: There is no distension.     Palpations: Abdomen is soft. There is no mass.  Musculoskeletal:  General: No swelling.  Skin:    General: Skin is warm.  Neurological:     General: No focal deficit present.     Mental Status: He is alert and oriented to person, place, and time.  Psychiatric:        Mood and Affect: Mood normal.        Behavior: Behavior normal.      LABORATORY DATA:  I have reviewed the labs as listed.  CBC    Component Value Date/Time   WBC 11.7 (H) 09/08/2018 1059   RBC 4.12 (L) 09/08/2018 1059   HGB 13.3 09/08/2018 1059   HCT 41.2 09/08/2018 1059   PLT 274 09/08/2018 1059   MCV 100.0 09/08/2018 1059   MCH 32.3 09/08/2018 1059   MCHC 32.3 09/08/2018 1059   RDW 13.1 09/08/2018 1059   LYMPHSABS 0.7 09/08/2018 1059   MONOABS 0.9 09/08/2018 1059   EOSABS 0.2 09/08/2018 1059   BASOSABS 0.1 09/08/2018 1059   CMP Latest Ref Rng & Units 09/08/2018 07/02/2018 02/12/2018  Glucose 70 - 99 mg/dL 205(H) 162(H) 165(H)  BUN 8 - 23 mg/dL '18 17 17  '$ Creatinine 0.61 - 1.24 mg/dL 1.16 1.08 1.01  Sodium 135 - 145 mmol/L 138 140 138  Potassium 3.5 - 5.1 mmol/L 4.3 4.5 4.1  Chloride 98 - 111 mmol/L 103 103 103  CO2 22 - 32 mmol/L '27 28 28  '$ Calcium 8.9 - 10.3 mg/dL 9.3 9.3 9.0  Total Protein 6.5 - 8.1 g/dL 6.6 7.1 7.0  Total Bilirubin 0.3 - 1.2 mg/dL 1.5(H) 1.8(H) 1.6(H)  Alkaline Phos 38 - 126 U/L 99 80 103  AST 15 - 41 U/L 31 29 42(H)  ALT 0 - 44 U/L 38 36 48(H)       DIAGNOSTIC IMAGING:  I have independently reviewed the scans and discussed with the patient.   I have reviewed Venita Lick LPN's note and agree with the documentation.  I personally performed a face-to-face visit, made revisions and my assessment and plan is as follows.    ASSESSMENT & PLAN:   Autoimmune hemolytic anemia ([place]) 1.  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 - Rituximab weekly x4 in June 2015. - He is on  prednisone 5 mg daily, most recent taper was in May 2019 from 10 mg daily. - He was told to further taper it to 5 mg every other day.  He started taking it on 07/10/2018 but developed diarrhea for 1 week.  He even tried taking 2.5 mg once daily. - Because of COVID-19 and the diarrhea, he did not want to taper it yet.  He is right now continuing prednisone 5 mg daily. -We reviewed his blood work which showed stable hemoglobin, LDH.  Haptoglobin is low.  Total bilirubin is stable. - We will continue at the same dose level at this time.  He wants to taper it once the COVID-19 resolves. -We will see him back in 3 months for follow-up.  2.  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  He is on Actos and metformin.  He takes insulin as needed.  Total time spent is 25 minutes with more than 50% of the time spent face-to-face discussing treatment plan, counseling and coordination of care.    Orders placed this encounter:  Orders Placed This Encounter  Procedures  . CBC with Differential/Platelet  . Comprehensive metabolic panel  . Lactate dehydrogenase  . Haptoglobin  . Reticulocytes      Derek Jack, MD Deneise Lever  [facility] [phone]    

## 2018-10-02 ENCOUNTER — Other Ambulatory Visit (HOSPITAL_COMMUNITY): Payer: Self-pay | Admitting: Internal Medicine

## 2018-10-02 DIAGNOSIS — D591 Autoimmune hemolytic anemia, unspecified: Secondary | ICD-10-CM

## 2018-10-02 NOTE — Telephone Encounter (Signed)
Pt requests refill  

## 2018-10-02 NOTE — Telephone Encounter (Signed)
Refill request needs to be discontinued/refused with a new eRx order under current provider name to stop/discontinue SureScript requests under Dr. Walden Field.  Thanks.

## 2018-12-16 ENCOUNTER — Other Ambulatory Visit: Payer: Self-pay

## 2018-12-16 ENCOUNTER — Inpatient Hospital Stay (HOSPITAL_COMMUNITY): Payer: Medicare Other | Attending: Hematology

## 2018-12-16 DIAGNOSIS — Z79899 Other long term (current) drug therapy: Secondary | ICD-10-CM | POA: Insufficient documentation

## 2018-12-16 DIAGNOSIS — E119 Type 2 diabetes mellitus without complications: Secondary | ICD-10-CM | POA: Diagnosis not present

## 2018-12-16 DIAGNOSIS — Z794 Long term (current) use of insulin: Secondary | ICD-10-CM | POA: Insufficient documentation

## 2018-12-16 DIAGNOSIS — D591 Autoimmune hemolytic anemia, unspecified: Secondary | ICD-10-CM | POA: Insufficient documentation

## 2018-12-16 LAB — CBC WITH DIFFERENTIAL/PLATELET
Abs Immature Granulocytes: 0.02 10*3/uL (ref 0.00–0.07)
Basophils Absolute: 0.1 10*3/uL (ref 0.0–0.1)
Basophils Relative: 1 %
Eosinophils Absolute: 0.3 10*3/uL (ref 0.0–0.5)
Eosinophils Relative: 4 %
HCT: 41 % (ref 39.0–52.0)
Hemoglobin: 13.3 g/dL (ref 13.0–17.0)
Immature Granulocytes: 0 %
Lymphocytes Relative: 11 %
Lymphs Abs: 0.8 10*3/uL (ref 0.7–4.0)
MCH: 33.1 pg (ref 26.0–34.0)
MCHC: 32.4 g/dL (ref 30.0–36.0)
MCV: 102 fL — ABNORMAL HIGH (ref 80.0–100.0)
Monocytes Absolute: 0.7 10*3/uL (ref 0.1–1.0)
Monocytes Relative: 9 %
Neutro Abs: 5.7 10*3/uL (ref 1.7–7.7)
Neutrophils Relative %: 75 %
Platelets: 241 10*3/uL (ref 150–400)
RBC: 4.02 MIL/uL — ABNORMAL LOW (ref 4.22–5.81)
RDW: 12.8 % (ref 11.5–15.5)
WBC: 7.5 10*3/uL (ref 4.0–10.5)
nRBC: 0 % (ref 0.0–0.2)

## 2018-12-16 LAB — COMPREHENSIVE METABOLIC PANEL
ALT: 34 U/L (ref 0–44)
AST: 31 U/L (ref 15–41)
Albumin: 4 g/dL (ref 3.5–5.0)
Alkaline Phosphatase: 83 U/L (ref 38–126)
Anion gap: 8 (ref 5–15)
BUN: 21 mg/dL (ref 8–23)
CO2: 29 mmol/L (ref 22–32)
Calcium: 9.6 mg/dL (ref 8.9–10.3)
Chloride: 105 mmol/L (ref 98–111)
Creatinine, Ser: 1.14 mg/dL (ref 0.61–1.24)
GFR calc Af Amer: >60 mL/min (ref >60)
GFR calc non Af Amer: >60 mL/min (ref >60)
Glucose, Bld: 143 mg/dL — ABNORMAL HIGH (ref 70–99)
Potassium: 4.6 mmol/L (ref 3.5–5.1)
Sodium: 142 mmol/L (ref 135–145)
Total Bilirubin: 1.9 mg/dL — ABNORMAL HIGH (ref 0.3–1.2)
Total Protein: 7.1 g/dL (ref 6.5–8.1)

## 2018-12-16 LAB — LACTATE DEHYDROGENASE: LDH: 285 U/L — ABNORMAL HIGH (ref 98–192)

## 2018-12-16 LAB — RETICULOCYTES
Immature Retic Fract: 15.7 % (ref 2.3–15.9)
RBC.: 4.02 MIL/uL — ABNORMAL LOW (ref 4.22–5.81)
Retic Count, Absolute: 232.4 10*3/uL — ABNORMAL HIGH (ref 19.0–186.0)
Retic Ct Pct: 5.8 % — ABNORMAL HIGH (ref 0.4–3.1)

## 2018-12-17 LAB — HAPTOGLOBIN: Haptoglobin: <10 mg/dL — ABNORMAL LOW (ref 34–355)

## 2018-12-23 ENCOUNTER — Other Ambulatory Visit: Payer: Self-pay

## 2018-12-23 ENCOUNTER — Inpatient Hospital Stay (HOSPITAL_BASED_OUTPATIENT_CLINIC_OR_DEPARTMENT_OTHER): Payer: Medicare Other | Admitting: Hematology

## 2018-12-23 VITALS — BP 146/66 | HR 81 | Temp 97.3°F | Resp 18 | Wt 237.6 lb

## 2018-12-23 DIAGNOSIS — D591 Autoimmune hemolytic anemia, unspecified: Secondary | ICD-10-CM

## 2018-12-23 DIAGNOSIS — E119 Type 2 diabetes mellitus without complications: Secondary | ICD-10-CM | POA: Diagnosis not present

## 2018-12-23 DIAGNOSIS — Z0000 Encounter for general adult medical examination without abnormal findings: Secondary | ICD-10-CM

## 2019-01-28 NOTE — Assessment & Plan Note (Signed)
1.  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 - Rituximab weekly x4 in June 2015. - He is on prednisone 5 mg daily, most recent taper was in May 2019 from 10 mg daily. - He was told to further taper it to 5 mg every other day.  He started taking it on 07/10/2018 but developed diarrhea for 1 week.  He even tried taking 2.5 mg once daily. - Because of COVID-19 and the diarrhea, he did not want to taper it yet.  He is right now continuing prednisone 5 mg daily. -Hemoglobin is stable at 13.3, haptoglobin is less than 10, recheck counts remain elevated at 232.4 with a percentage of 5.8.  Recommend patient continue on prednisone 5 mg daily. -We will see him back in 3 months for follow-up.  2.  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  He is on Actos and metformin.  He takes insulin as needed.

## 2019-01-28 NOTE — Progress Notes (Signed)
[facility] [address], [name] [address]   CLINIC:  Medical Oncology/Hematology  PCP:  [name], MD [address] [name] [address] [phone]   REASON FOR VISIT:  Follow-up for hemolytic anemia  CURRENT THERAPY: Prednisone    INTERVAL HISTORY:  [name] 73 y.o. male presents today for follow-up.  Reports overall doing well.  Denies any obvious signs of bleeding.  Denies any recent recurrent infections.  Continues on prednisone 5 mg daily, tolerating well.  Denies any change in bowel habits.  Appetite is stable.  No weight loss.  He is here for repeat labs and office visit.   REVIEW OF SYSTEMS:  Review of Systems  Constitutional: Negative.   HENT:  Negative.   Eyes: Negative.   Respiratory: Negative.   Cardiovascular: Negative.   Gastrointestinal: Negative.   Endocrine: Negative.   Genitourinary: Negative.    Musculoskeletal: Negative.   Skin: Negative.   Neurological: Negative.   Hematological: Negative.   Psychiatric/Behavioral: Negative.      PAST MEDICAL/SURGICAL HISTORY:  Past Medical History:  Diagnosis Date  . Coronary atherosclerosis of native coronary artery    Multivessel, LVEF 59%  . Essential hypertension, benign   . Hemolytic anemia (HCC)   . Hyperlipidemia   . Pernicious anemia    Past Surgical History:  Procedure Laterality Date  . CORONARY ARTERY BYPASS GRAFT  2004   LIMA to LAD, SVG to OM, SVG to PDA     SOCIAL HISTORY:  Social History   Socioeconomic History  . Marital status: Married    Spouse name: Not on file  . Number of children: Not on file  . Years of education: Not on file  . Highest education level: Not on file  Occupational History  . Occupation: Full time  Tobacco Use  . Smoking status: Former Smoker    Types: Cigarettes    Quit date: 02/11/2002    Years since quitting: 16.9  . Smokeless tobacco: Never Used  Substance and Sexual Activity  . Alcohol use: No    Alcohol/week: 0.0  standard drinks  . Drug use: No  . Sexual activity: Not on file  Other Topics Concern  . Not on file  Social History Narrative  . Not on file   Social Determinants of Health   Financial Resource Strain:   . Difficulty of Paying Living Expenses: Not on file  Food Insecurity:   . Worried About Programme researcher, broadcasting/film/video in the Last Year: Not on file  . Ran Out of Food in the Last Year: Not on file  Transportation Needs:   . Lack of Transportation (Medical): Not on file  . Lack of Transportation (Non-Medical): Not on file  Physical Activity:   . Days of Exercise per Week: Not on file  . Minutes of Exercise per Session: Not on file  Stress:   . Feeling of Stress : Not on file  Social Connections:   . Frequency of Communication with Friends and Family: Not on file  . Frequency of Social Gatherings with Friends and Family: Not on file  . Attends Religious Services: Not on file  . Active Member of Clubs or Organizations: Not on file  . Attends Banker Meetings: Not on file  . Marital Status: Not on file  Intimate Partner Violence:   . Fear of Current or Ex-Partner: Not on file  . Emotionally Abused: Not on file  . Physically Abused: Not on file  .  Sexually Abused: Not on file    FAMILY HISTORY:  Family History  Problem Relation Age of Onset  . Dementia Mother   . Cancer Mother        colon  . COPD Father   . Ulcerative colitis Father   . Heart attack Father     CURRENT MEDICATIONS:  Outpatient Encounter Medications as of 12/23/2018  Medication Sig Note  . APPLE CIDER VINEGAR PO Take 30 mLs by mouth daily.   Marland Kitchen. aspirin EC 81 MG tablet Take 81 mg by mouth daily.   Marland Kitchen. b complex vitamins tablet Take 1 tablet by mouth daily.   . calcium carbonate (OS-CAL) 600 MG TABS tablet Take 600 mg by mouth 2 (two) times daily with a meal.    . Cranberry 500 MG CAPS Take 2 capsules by mouth daily.  09/01/2015: Received from: [facility]  . folic acid (FOLVITE) 1 MG tablet TAKE 1  TABLET ONCE DAILY.   Marland Kitchen. GARLIC OIL PO Take 1 capsule by mouth daily.   Marland Kitchen. glipiZIDE (GLUCOTROL) 5 MG tablet Take 5 mg by mouth daily.    Marland Kitchen. levothyroxine (SYNTHROID, LEVOTHROID) 50 MCG tablet Take 50 mcg by mouth daily.   . Magnesium 250 MG TABS Take 1 tablet by mouth daily.   . metFORMIN (GLUCOPHAGE) 500 MG tablet    . metoprolol succinate (TOPROL-XL) 25 MG 24 hr tablet Take 25 mg by mouth daily.     . Misc Natural Products (PROSTATE CONTROL PO) Take 1 tablet by mouth daily.   . Multiple Vitamins-Minerals (MULTI FOR HIM 50+) TABS Take 1 tablet by mouth daily.  09/01/2015: Received from: [facility]  . Omega 3-6-9 CAPS Take 1 capsule by mouth daily.  09/01/2015: Received from: [facility]  . ONETOUCH VERIO test strip    . POTASSIUM PO Take 99 mg by mouth daily.    . prednisoLONE 5 MG TABS tablet Take 5 mg by mouth daily.   . psyllium (METAMUCIL) 58.6 % powder Take 1 packet by mouth daily.    . Red Yeast Rice 600 MG TABS Take 1 tablet by mouth daily.  09/01/2015: Received from: [facility]  . Saw Palmetto 160 MG CAPS Take 1 capsule by mouth daily.  09[facility]01/2015: [facility]  . vitamin B-12 (CYANOCOBALAMIN) 1000 MCG tablet Take 1,000 mcg by mouth daily.   . [DISCONTINUED] pioglitazone (ACTOS) 15 MG tablet    . Aspirin-Acetaminophen-Caffeine (GOODYS EXTRA STRENGTH) 500-325-65 MG PACK Take by mouth daily as needed.  [facility]/2015: [facility]  . insulin aspart (NOVOLOG) 100 UNIT/ML injection Inject 0-6 Units into the skin as needed.    . [DISCONTINUED] dicyclomine (BENTYL) 10 MG capsule     No facility-administered encounter medications on file as of 12/23/2018.    ALLERGIES:  No Known Allergies   PHYSICAL EXAM:  ECOG Performance status: 1  Vitals:   12/23/18 1138  BP: (!) 146/66  Pulse: 81  Resp: 18  Temp: (!) 97.3 F (36.3 C)  SpO2: 100%   Filed Weights   12/23/18 1138  Weight: 237 lb 9.6 oz (107.8 kg)    Physical  Exam Constitutional:      Appearance: Normal appearance.  HENT:     Head: Normocephalic.     Right Ear: External ear normal.     Left Ear: External ear normal.     Nose: Nose normal.     Mouth/Throat:     Pharynx: Oropharynx is clear.  Eyes:  Conjunctiva/sclera: Conjunctivae normal.  Cardiovascular:     Rate and Rhythm: Normal rate and regular rhythm.     Pulses: Normal pulses.     Heart sounds: Normal heart sounds.  Pulmonary:     Effort: Pulmonary effort is normal.     Breath sounds: Normal breath sounds.  Abdominal:     General: Bowel sounds are normal.  Musculoskeletal:        General: Normal range of motion.     Cervical back: Normal range of motion.  Skin:    General: Skin is warm and dry.  Neurological:     General: No focal deficit present.     Mental Status: He is alert and oriented to person, place, and time.  Psychiatric:        Mood and Affect: Mood normal.        Behavior: Behavior normal.      LABORATORY DATA:  I have reviewed the labs as listed.  CBC    Component Value Date/Time   WBC 7.5 12/16/2018 1024   RBC 4.02 (L) 12/16/2018 1024   RBC 4.02 (L) 12/16/2018 1024   HGB 13.3 12/16/2018 1024   HCT 41.0 12/16/2018 1024   PLT 241 12/16/2018 1024   MCV 102.0 (H) 12/16/2018 1024   MCH 33.1 12/16/2018 1024   MCHC 32.4 12/16/2018 1024   RDW 12.8 12/16/2018 1024   LYMPHSABS 0.8 12/16/2018 1024   MONOABS 0.7 12/16/2018 1024   EOSABS 0.3 12/16/2018 1024   BASOSABS 0.1 12/16/2018 1024   CMP Latest Ref Rng & Units 12/16/2018 09/08/2018 07/02/2018  Glucose 70 - 99 mg/dL 143(H) 205(H) 162(H)  BUN 8 - 23 mg/dL 21 18 17   Creatinine 0.61 - 1.24 mg/dL 1.14 1.16 1.08  Sodium 135 - 145 mmol/L 142 138 140  Potassium 3.5 - 5.1 mmol/L 4.6 4.3 4.5  Chloride 98 - 111 mmol/L 105 103 103  CO2 22 - 32 mmol/L 29 27 28   Calcium 8.9 - 10.3 mg/dL 9.6 9.3 9.3  Total Protein 6.5 - 8.1 g/dL 7.1 6.6 7.1  Total Bilirubin 0.3 - 1.2 mg/dL 1.9(H) 1.5(H) 1.8(H)  Alkaline  Phos 38 - 126 U/L 83 99 80  AST 15 - 41 U/L 31 31 29   ALT 0 - 44 U/L 34 38 36        ASSESSMENT & PLAN:   Autoimmune hemolytic anemia (HCC) 1.  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 - Rituximab weekly x4 in June 2015. - He is on prednisone 5 mg daily, most recent taper was in May 2019 from 10 mg daily. - He was told to further taper it to 5 mg every other day.  He started taking it on 07/10/2018 but developed diarrhea for 1 week.  He even tried taking 2.5 mg once daily. - Because of COVID-19 and the diarrhea, he did not want to taper it yet.  He is right now continuing prednisone 5 mg daily. -Hemoglobin is stable at 13.3, haptoglobin is less than 10, recheck counts remain elevated at 232.4 with a percentage of 5.8.  Recommend patient continue on prednisone 5 mg daily. -We will see him back in 3 months for follow-up.  2.  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  He is on Actos and metformin.  He takes insulin as needed.        [place] [phone]

## 2019-03-02 ENCOUNTER — Other Ambulatory Visit (HOSPITAL_COMMUNITY): Payer: Self-pay | Admitting: Hematology

## 2019-03-02 ENCOUNTER — Other Ambulatory Visit (HOSPITAL_COMMUNITY): Payer: Self-pay | Admitting: Internal Medicine

## 2019-03-02 DIAGNOSIS — D591 Autoimmune hemolytic anemia, unspecified: Secondary | ICD-10-CM

## 2019-03-03 ENCOUNTER — Other Ambulatory Visit (HOSPITAL_COMMUNITY): Payer: Self-pay | Admitting: *Deleted

## 2019-03-03 MED ORDER — PREDNISOLONE 5 MG PO TABS: 5.0000 mg | ORAL_TABLET | Freq: Every day | ORAL | 2 refills | Status: DC

## 2019-03-03 NOTE — Telephone Encounter (Signed)
Refill request

## 2019-04-03 ENCOUNTER — Other Ambulatory Visit (HOSPITAL_COMMUNITY): Payer: Self-pay | Admitting: Nurse Practitioner

## 2019-04-03 DIAGNOSIS — D591 Autoimmune hemolytic anemia, unspecified: Secondary | ICD-10-CM

## 2019-05-05 ENCOUNTER — Other Ambulatory Visit (HOSPITAL_COMMUNITY): Payer: Self-pay | Admitting: Nurse Practitioner

## 2019-05-05 DIAGNOSIS — D591 Autoimmune hemolytic anemia, unspecified: Secondary | ICD-10-CM

## 2019-06-07 ENCOUNTER — Other Ambulatory Visit (HOSPITAL_COMMUNITY): Payer: Self-pay | Admitting: Nurse Practitioner

## 2019-06-07 DIAGNOSIS — D591 Autoimmune hemolytic anemia, unspecified: Secondary | ICD-10-CM

## 2019-06-14 ENCOUNTER — Other Ambulatory Visit (HOSPITAL_COMMUNITY): Payer: Self-pay

## 2019-06-14 DIAGNOSIS — R911 Solitary pulmonary nodule: Secondary | ICD-10-CM

## 2019-06-14 DIAGNOSIS — D591 Autoimmune hemolytic anemia, unspecified: Secondary | ICD-10-CM

## 2019-06-15 ENCOUNTER — Inpatient Hospital Stay (HOSPITAL_COMMUNITY): Payer: Medicare Other | Attending: Hematology

## 2019-06-15 ENCOUNTER — Other Ambulatory Visit: Payer: Self-pay

## 2019-06-15 DIAGNOSIS — Z794 Long term (current) use of insulin: Secondary | ICD-10-CM | POA: Insufficient documentation

## 2019-06-15 DIAGNOSIS — Z79899 Other long term (current) drug therapy: Secondary | ICD-10-CM | POA: Insufficient documentation

## 2019-06-15 DIAGNOSIS — D591 Autoimmune hemolytic anemia, unspecified: Secondary | ICD-10-CM | POA: Insufficient documentation

## 2019-06-15 DIAGNOSIS — R911 Solitary pulmonary nodule: Secondary | ICD-10-CM

## 2019-06-15 DIAGNOSIS — E119 Type 2 diabetes mellitus without complications: Secondary | ICD-10-CM | POA: Diagnosis not present

## 2019-06-15 DIAGNOSIS — T380X5A Adverse effect of glucocorticoids and synthetic analogues, initial encounter: Secondary | ICD-10-CM | POA: Diagnosis not present

## 2019-06-15 LAB — COMPREHENSIVE METABOLIC PANEL
ALT: 43 U/L (ref 0–44)
AST: 37 U/L (ref 15–41)
Albumin: 3.7 g/dL (ref 3.5–5.0)
Alkaline Phosphatase: 82 U/L (ref 38–126)
Anion gap: 10 (ref 5–15)
BUN: 20 mg/dL (ref 8–23)
CO2: 26 mmol/L (ref 22–32)
Calcium: 9.2 mg/dL (ref 8.9–10.3)
Chloride: 102 mmol/L (ref 98–111)
Creatinine, Ser: 1.07 mg/dL (ref 0.61–1.24)
GFR calc Af Amer: >60 mL/min (ref >60)
GFR calc non Af Amer: >60 mL/min (ref >60)
Glucose, Bld: 151 mg/dL — ABNORMAL HIGH (ref 70–99)
Potassium: 4.4 mmol/L (ref 3.5–5.1)
Sodium: 138 mmol/L (ref 135–145)
Total Bilirubin: 1.5 mg/dL — ABNORMAL HIGH (ref 0.3–1.2)
Total Protein: 7 g/dL (ref 6.5–8.1)

## 2019-06-15 LAB — CBC WITH DIFFERENTIAL/PLATELET
Abs Immature Granulocytes: 0.04 10*3/uL (ref 0.00–0.07)
Basophils Absolute: 0.1 10*3/uL (ref 0.0–0.1)
Basophils Relative: 1 %
Eosinophils Absolute: 0.2 10*3/uL (ref 0.0–0.5)
Eosinophils Relative: 2 %
HCT: 39.6 % (ref 39.0–52.0)
Hemoglobin: 12.7 g/dL — ABNORMAL LOW (ref 13.0–17.0)
Immature Granulocytes: 1 %
Lymphocytes Relative: 15 %
Lymphs Abs: 1.2 10*3/uL (ref 0.7–4.0)
MCH: 32.5 pg (ref 26.0–34.0)
MCHC: 32.1 g/dL (ref 30.0–36.0)
MCV: 101.3 fL — ABNORMAL HIGH (ref 80.0–100.0)
Monocytes Absolute: 0.6 10*3/uL (ref 0.1–1.0)
Monocytes Relative: 8 %
Neutro Abs: 5.8 10*3/uL (ref 1.7–7.7)
Neutrophils Relative %: 73 %
Platelets: 210 10*3/uL (ref 150–400)
RBC: 3.91 MIL/uL — ABNORMAL LOW (ref 4.22–5.81)
RDW: 13.2 % (ref 11.5–15.5)
WBC: 7.8 10*3/uL (ref 4.0–10.5)
nRBC: 0 % (ref 0.0–0.2)

## 2019-06-15 LAB — LACTATE DEHYDROGENASE: LDH: 260 U/L — ABNORMAL HIGH (ref 98–192)

## 2019-06-15 LAB — RETICULOCYTES
Immature Retic Fract: 18.2 % — ABNORMAL HIGH (ref 2.3–15.9)
RBC.: 3.97 MIL/uL — ABNORMAL LOW (ref 4.22–5.81)
Retic Count, Absolute: 215.6 10*3/uL — ABNORMAL HIGH (ref 19.0–186.0)
Retic Ct Pct: 5.4 % — ABNORMAL HIGH (ref 0.4–3.1)

## 2019-06-16 LAB — HAPTOGLOBIN: Haptoglobin: <10 mg/dL — ABNORMAL LOW (ref 34–355)

## 2019-06-22 ENCOUNTER — Inpatient Hospital Stay (HOSPITAL_BASED_OUTPATIENT_CLINIC_OR_DEPARTMENT_OTHER): Payer: Medicare Other | Admitting: Nurse Practitioner

## 2019-06-22 ENCOUNTER — Encounter (HOSPITAL_COMMUNITY): Payer: Self-pay | Admitting: Nurse Practitioner

## 2019-06-22 ENCOUNTER — Other Ambulatory Visit: Payer: Self-pay

## 2019-06-22 DIAGNOSIS — D591 Autoimmune hemolytic anemia, unspecified: Secondary | ICD-10-CM

## 2019-06-22 MED ORDER — PREDNISONE 2.5 MG PO TABS: 2.5000 mg | ORAL_TABLET | Freq: Every day | ORAL | 4 refills | Status: DC

## 2019-06-22 NOTE — Assessment & Plan Note (Signed)
1.  Autoimmune hemolytic anemia: -Diagnosed in August 2014, treated with high-dose prednisone. -Rituximab weekly x4 in June 2015. -He was on prednisone 10 mg daily which was tapered in May 2019 to 5 mg daily. -He was told to further taper it to 5 mg every other day.  He started doing that on 07/10/2018 but developed diarrhea for 1 week. -Because of COVID-19 and diarrhea he did not want to taper yet. -He started taking 2.5 mg once daily and has continued on this dose. -Labs done on 06/15/2019 showed hemoglobin 12.7, reticulocyte percentage 5.4 -He will continue taking 2.5 mg daily. -We will see him back in 3 months with repeat labs.  2.  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 -He is on Actos and Metformin. -He takes insulin as needed.

## 2019-06-22 NOTE — Patient Instructions (Signed)
[facility] at [facility] Discharge Instructions  Follow up in 3 months with lab s   Thank you for choosing [facility] at [facility] to provide your oncology and hematology care.  To afford each patient quality time with our provider, please arrive at least 15 minutes before your scheduled appointment time.   If you have a lab appointment with the Cancer Center please come in thru the Main Entrance and check in at the main information desk.  You need to re-schedule your appointment should you arrive 10 or more minutes late.  We strive to give you quality time with our providers, and arriving late affects you and other patients whose appointments are after yours.  Also, if you no show three or more times for appointments you may be dismissed from the clinic at the providers discretion.     Again, thank you for choosing [facility].  Our hope is that these requests will decrease the amount of time that you wait before being seen by our physicians.       _____________________________________________________________  Should you have questions after your visit to [facility], please contact our office at [phone] between the hours of 8:00 a.m. and 4:30 p.m.  Voicemails left after 4:00 p.m. will not be returned until the following business day.  For prescription refill requests, have your pharmacy contact our office and allow 72 hours.    Due to Covid, you will need to wear a mask upon entering the hospital. If you do not have a mask, a mask will be given to you at the Main Entrance upon arrival. For doctor visits, patients may have 1 support person with them. For treatment visits, patients can not have anyone with them due to social distancing guidelines and our immunocompromised population.      

## 2019-06-22 NOTE — Progress Notes (Signed)
[facility] [address], [place] [address]   CLINIC:  Medical Oncology/Hematology  PCP:  [name], MD [address] [place] [address] [phone]   REASON FOR VISIT: Follow-up for autoimmune hemolytic anemia   CURRENT THERAPY: Prednisone 2.5 mg daily   INTERVAL HISTORY:  [name] 74 y.o. male returns for routine follow-up for autoimmune hemolytic anemia. He is doing well since his last visit. He has no complaints at this time. Denies any nausea, vomiting, or diarrhea. Denies any new pains. Had not noticed any recent bleeding such as epistaxis, hematuria or hematochezia. Denies recent chest pain on exertion, shortness of breath on minimal exertion, pre-syncopal episodes, or palpitations. Denies any numbness or tingling in hands or feet. Denies any recent fevers, infections, or recent hospitalizations. Patient reports appetite at 100% and energy level at 50%. He is eating well and maintaining his weight at this time.     REVIEW OF SYSTEMS:  Review of Systems  All other systems reviewed and are negative.    PAST MEDICAL/SURGICAL HISTORY:  Past Medical History:  Diagnosis Date  . Coronary atherosclerosis of native coronary artery    Multivessel, LVEF 59%  . Essential hypertension, benign   . Hemolytic anemia (HCC)   . Hyperlipidemia   . Pernicious anemia    Past Surgical History:  Procedure Laterality Date  . CORONARY ARTERY BYPASS GRAFT  2004   LIMA to LAD, SVG to OM, SVG to PDA     SOCIAL HISTORY:  Social History   Socioeconomic History  . Marital status: Married    Spouse name: Not on file  . Number of children: Not on file  . Years of education: Not on file  . Highest education level: Not on file  Occupational History  . Occupation: Full time  Tobacco Use  . Smoking status: Former Smoker    Types: Cigarettes    Quit date: 02/11/2002    Years since quitting: 17.3  . Smokeless tobacco: Never Used  Substance and Sexual  Activity  . Alcohol use: No    Alcohol/week: 0.0 standard drinks  . Drug use: No  . Sexual activity: Not on file  Other Topics Concern  . Not on file  Social History Narrative  . Not on file   Social Determinants of Health   Financial Resource Strain:   . Difficulty of Paying Living Expenses:   Food Insecurity:   . Worried About Programme researcher, broadcasting/film/video in the Last Year:   . Barista in the Last Year:   Transportation Needs:   . Freight forwarder (Medical):   Marland Kitchen Lack of Transportation (Non-Medical):   Physical Activity:   . Days of Exercise per Week:   . Minutes of Exercise per Session:   Stress:   . Feeling of Stress :   Social Connections:   . Frequency of Communication with Friends and Family:   . Frequency of Social Gatherings with Friends and Family:   . Attends Religious Services:   . Active Member of Clubs or Organizations:   . Attends Banker Meetings:   Marland Kitchen Marital Status:   Intimate Partner Violence:   . Fear of Current or Ex-Partner:   . Emotionally Abused:   Marland Kitchen Physically Abused:   . Sexually Abused:     FAMILY HISTORY:  Family History  Problem Relation Age of Onset  . Dementia Mother   . Cancer Mother  colon  . COPD Father   . Ulcerative colitis Father   . Heart attack Father     CURRENT MEDICATIONS:  Outpatient Encounter Medications as of 06/22/2019  Medication Sig Note  . APPLE CIDER VINEGAR PO Take 30 mLs by mouth daily.   Marland Kitchen aspirin EC 81 MG tablet Take 81 mg by mouth daily.   . Aspirin-Acetaminophen-Caffeine (GOODYS EXTRA STRENGTH) [phone] MG PACK Take by mouth daily as needed.  09/01/2015: Received from: [facility]  . b complex vitamins tablet Take 1 tablet by mouth daily.   . calcium carbonate (OS-CAL) 600 MG TABS tablet Take 600 mg by mouth 2 (two) times daily with a meal.    . Cranberry 500 MG CAPS Take 2 capsules by mouth daily.  09/01/2015: Received from: [facility]  . folic acid (FOLVITE) 1  MG tablet TAKE 1 TABLET BY MOUTH ONCE DAILY.   Marland Kitchen GARLIC OIL PO Take 1 capsule by mouth daily.   Marland Kitchen glipiZIDE (GLUCOTROL) 5 MG tablet Take 5 mg by mouth daily.    . insulin aspart (NOVOLOG) 100 UNIT/ML injection Inject 0-6 Units into the skin as needed.    Marland Kitchen levothyroxine (SYNTHROID, LEVOTHROID) 50 MCG tablet Take 50 mcg by mouth daily.   . Magnesium 250 MG TABS Take 1 tablet by mouth daily.   . metFORMIN (GLUCOPHAGE) 500 MG tablet    . metoprolol succinate (TOPROL-XL) 25 MG 24 hr tablet Take 25 mg by mouth daily.     . Misc Natural Products (PROSTATE CONTROL PO) Take 1 tablet by mouth daily.   . Multiple Vitamins-Minerals (MULTI FOR HIM 50+) TABS Take 1 tablet by mouth daily.  09/01/2015: Received from: [facility]  . Omega 3-6-9 CAPS Take 1 capsule by mouth daily.  09/01/2015: Received from: [facility]  . ONETOUCH VERIO test strip    . POTASSIUM PO Take 99 mg by mouth daily.    . prednisoLONE 5 MG TABS tablet Take 1 tablet (5 mg total) by mouth daily. (Patient taking differently: Take 2.5 mg by mouth daily. Takes 1/2 tablet daily)   . psyllium (METAMUCIL) 58.6 % powder Take 1 packet by mouth daily.    . Red Yeast Rice 600 MG TABS Take 1 tablet by mouth daily.  09/01/2015: Received from: [facility]  . Saw Palmetto 160 MG CAPS Take 1 capsule by mouth daily.  09/01/2015: Received [facility]: [facility]  . vitamin B-12 (CYANOCOBALAMIN) 1000 MCG tablet Take 1,000 mcg by mouth daily.   . Zinc Sulfate (ZINC 15 PO) Take by mouth. Patient does not know the correct dose    No facility-administered encounter medications on file as of 06/22/2019.    ALLERGIES:  No Known Allergies   PHYSICAL EXAM:  ECOG Performance status: 1  Vitals:   06/22/19 1200  BP: (!) 147/67  Pulse: 81  Resp: 16  Temp: (!) 96.9 F (36.1 C)  SpO2: 98%   Filed Weights   06/22/19 1200  Weight: 229 lb 2 oz (103.9 kg)   Physical Exam Constitutional:      Appearance: Normal appearance. He is normal  weight.  Abdominal:     General: Bowel sounds are normal.     Palpations: Abdomen is soft.  Musculoskeletal:        General: Normal range of motion.  Skin:    General: Skin is warm.  Neurological:     Mental Status: He is alert and oriented to person, place, and time. Mental status is at  baseline.  Psychiatric:        Mood and Affect: Mood normal.        Behavior: Behavior normal.        Thought Content: Thought content normal.        Judgment: Judgment normal.      LABORATORY DATA:  I have reviewed the labs as listed.  CBC    Component Value Date/Time   WBC 7.8 06/15/2019 1055   RBC 3.91 (L) 06/15/2019 1055   RBC 3.97 (L) 06/15/2019 1055   HGB 12.7 (L) 06/15/2019 1055   HCT 39.6 06/15/2019 1055   PLT 210 06/15/2019 1055   MCV 101.3 (H) 06/15/2019 1055   MCH 32.5 06/15/2019 1055   MCHC 32.1 06/15/2019 1055   RDW 13.2 06/15/2019 1055   LYMPHSABS 1.2 06/15/2019 1055   MONOABS 0.6 06/15/2019 1055   EOSABS 0.2 06/15/2019 1055   BASOSABS 0.1 06/15/2019 1055   CMP Latest Ref Rng & Units 06/15/2019 12/16/2018 09/08/2018  Glucose 70 - 99 mg/dL 151(H) 143(H) 205(H)  BUN 8 - 23 mg/dL 20 21 18   Creatinine 0.61 - 1.24 mg/dL 1.07 1.14 1.16  Sodium 135 - 145 mmol/L 138 142 138  Potassium 3.5 - 5.1 mmol/L 4.4 4.6 4.3  Chloride 98 - 111 mmol/L 102 105 103  CO2 22 - 32 mmol/L 26 29 27   Calcium 8.9 - 10.3 mg/dL 9.2 9.6 9.3  Total Protein 6.5 - 8.1 g/dL 7.0 7.1 6.6  Total Bilirubin 0.3 - 1.2 mg/dL 1.5(H) 1.9(H) 1.5(H)  Alkaline Phos 38 - 126 U/L 82 83 99  AST 15 - 41 U/L 37 31 31  ALT 0 - 44 U/L 43 34 38   All questions were answered to patient's stated satisfaction. Encouraged patient to call with any new concerns or questions before his next visit to the cancer center and we can certain see him sooner, if needed.     ASSESSMENT & PLAN:  Autoimmune hemolytic anemia ([place]) 1.  Autoimmune hemolytic anemia: -Diagnosed in August 2014, treated with high-dose prednisone. -Rituximab  weekly x4 in June 2015. -He was on prednisone 10 mg daily which was tapered in May 2019 to 5 mg daily. -He was told to further taper it to 5 mg every other day.  He started doing that on 07/10/2018 but developed diarrhea for 1 week. -Because of COVID-19 and diarrhea he did not want to taper yet. -He started taking 2.5 mg once daily and has continued on this dose. -Labs done on 06/15/2019 showed hemoglobin 12.7, reticulocyte percentage 5.4 -He will continue taking 2.5 mg daily. -We will see him back in 3 months with repeat labs.  2.  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 -He is on Actos and Metformin. -He takes insulin as needed.     Orders placed this encounter:  Orders Placed This Encounter  Procedures  . Lactate dehydrogenase  . CBC with Differential/Platelet  . Comprehensive metabolic panel  . Vitamin B12  . VITAMIN D 25 Hydroxy (Vit-D Deficiency, Fractures)  . Reticulocytes  . Haptoglobin      Francene Finders, FNP-C [place] [phone]

## 2019-07-05 ENCOUNTER — Other Ambulatory Visit (HOSPITAL_COMMUNITY): Payer: Self-pay | Admitting: Nurse Practitioner

## 2019-07-05 DIAGNOSIS — D591 Autoimmune hemolytic anemia, unspecified: Secondary | ICD-10-CM

## 2019-08-04 ENCOUNTER — Ambulatory Visit (INDEPENDENT_AMBULATORY_CARE_PROVIDER_SITE_OTHER): Payer: Medicare Other | Admitting: Cardiology

## 2019-08-04 ENCOUNTER — Encounter: Payer: Self-pay | Admitting: Cardiology

## 2019-08-04 ENCOUNTER — Other Ambulatory Visit: Payer: Self-pay

## 2019-08-04 VITALS — BP 138/70 | HR 68 | Ht 73.0 in | Wt 233.6 lb

## 2019-08-04 DIAGNOSIS — I25119 Atherosclerotic heart disease of native coronary artery with unspecified angina pectoris: Secondary | ICD-10-CM | POA: Diagnosis not present

## 2019-08-04 DIAGNOSIS — I10 Essential (primary) hypertension: Secondary | ICD-10-CM | POA: Diagnosis not present

## 2019-08-04 DIAGNOSIS — E782 Mixed hyperlipidemia: Secondary | ICD-10-CM

## 2019-08-04 NOTE — Patient Instructions (Addendum)

## 2019-08-04 NOTE — Progress Notes (Signed)
Cardiology Office Note  Date: 08/04/2019   ID: [name] Page, [name] Page Nov 06, 1945, MRN [id number]  PCP:  Glenda Chroman, MD  Cardiologist:  Rozann Lesches, MD Electrophysiologist:  None   Chief Complaint  Patient presents with   Cardiac follow-up    History of Present Illness: [name] Page is a 74 y.o. male last assessed via telehealth encounter in June 2020.  He presents for a routine visit.  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  Follow-up Myoview from June of last year is outlined below, overall low risk.  I personally reviewed his ECG today which shows sinus rhythm with old inferior infarct pattern.  I reviewed his cardiac medications which are stable and outlined below.  I also reviewed his most recent lab work.  Past Medical History:  Diagnosis Date   Coronary atherosclerosis of native coronary artery    Multivessel, LVEF 59%   Essential hypertension    Hemolytic anemia (HCC)    Hyperlipidemia    Pernicious anemia     Past Surgical History:  Procedure Laterality Date   CORONARY ARTERY BYPASS GRAFT  2004   LIMA to LAD, SVG to OM, SVG to PDA    Current Outpatient Medications  Medication Sig Dispense Refill   APPLE CIDER VINEGAR PO Take 30 mLs by mouth daily.     aspirin EC 81 MG tablet Take 81 mg by mouth daily.     Aspirin-Acetaminophen-Caffeine (GOODYS EXTRA STRENGTH) 500-325-65 MG PACK Take by mouth daily as needed.      b complex vitamins tablet Take 1 tablet by mouth daily.     calcium carbonate (OS-CAL) 600 MG TABS tablet Take 600 mg by mouth 2 (two) times daily with a meal.      Cranberry 500 MG CAPS Take 2 capsules by mouth daily.      folic acid (FOLVITE) 1 MG tablet TAKE 1 TABLET BY MOUTH ONCE DAILY. 30 tablet 0   GARLIC OIL PO Take 1 capsule by mouth daily.     glipiZIDE (GLUCOTROL) 5 MG tablet Take 5 mg by mouth daily.      insulin aspart (NOVOLOG) 100  UNIT/ML injection Inject 0-6 Units into the skin as needed.      levothyroxine (SYNTHROID, LEVOTHROID) 50 MCG tablet Take 50 mcg by mouth daily.     Magnesium 250 MG TABS Take 1 tablet by mouth daily.     metFORMIN (GLUCOPHAGE) 500 MG tablet      metoprolol succinate (TOPROL-XL) 25 MG 24 hr tablet Take 25 mg by mouth daily.       Misc Natural Products (PROSTATE CONTROL PO) Take 1 tablet by mouth daily.     Multiple Vitamins-Minerals (MULTI FOR HIM 50+) TABS Take 1 tablet by mouth daily.      Omega 3-6-9 CAPS Take 1 capsule by mouth daily.      ONETOUCH VERIO test strip      POTASSIUM PO Take 99 mg by mouth daily.      predniSONE (DELTASONE) 2.5 MG tablet Take 1 tablet (2.5 mg total) by mouth daily with breakfast. 30 tablet 4   psyllium (METAMUCIL) 58.6 % powder Take 1 packet by mouth daily.      Red Yeast Rice 600 MG TABS Take 1 tablet by mouth daily.      Saw Palmetto 160 MG CAPS Take 1 capsule by mouth daily.      vitamin B-12 (CYANOCOBALAMIN) 1000  MCG tablet Take 1,000 mcg by mouth daily.     Zinc Sulfate (ZINC 15 PO) Take by mouth. Patient does not know the correct dose     No current facility-administered medications for this visit.   Allergies:  Patient has no known allergies.   ROS:  No palpitations or syncope.  Physical Exam: VS:  BP 138/70    Pulse 68    Ht 6\' 1"  (1.854 m)    Wt 233 lb 9.6 oz (106 kg)    SpO2 98%    BMI 30.82 kg/m , BMI Body mass index is 30.82 kg/m.  Wt Readings from Last 3 Encounters:  08/04/19 233 lb 9.6 oz (106 kg)  06/22/19 229 lb 2 oz (103.9 kg)  12/23/18 237 lb 9.6 oz (107.8 kg)    General: Patient appears comfortable at rest. HEENT: Conjunctiva and lids normal, wearing a mask. Neck: Supple, no elevated JVP or carotid bruits, no thyromegaly. Lungs: Clear to auscultation, nonlabored breathing at rest. Cardiac: Regular rate and rhythm, no S3 or significant systolic murmur, no pericardial rub. Extremities: No pitting edema, distal  pulses 2+. Skin: Warm and dry.  ECG:  An ECG dated 05/07/2017 was personally reviewed today and demonstrated:  Sinus rhythm with PVC and old anterolateral infarct pattern.  Recent Labwork: 06/15/2019: ALT 43; AST 37; BUN 20; Creatinine, Ser 1.07; Hemoglobin 12.7; Platelets 210; Potassium 4.4; Sodium 138   Other Studies Reviewed Today:  Lexiscan Myoview 08/10/2018:  No diagnostic ST segment changes to indicate ischemia. Occasional PACs and PVCs were noted without sustained arrhythmia.  Moderate sized, severe intensity, fixed inferior defect also involving the inferior septal wall at the base most consistent with infarct scar. There is no active ischemia.  This is a low risk study.  Nuclear stress EF: 58%.  Assessment and Plan:  1.  Multivessel CAD status post CABG in 2004.  Follow-up Myoview from last year was low risk with inferoseptal infarct scar and LVEF 58%.  Continue observation on medical therapy including aspirin and Toprol-XL.  2.  Statin intolerance.  He takes red yeast rice and follows with Dr. 2005.  3.  Hemolytic anemia, continues to follow with hematology.  Recent hemoglobin 12.7.  Medication Adjustments/Labs and Tests Ordered: Current medicines are reviewed at length with the patient today.  Concerns regarding medicines are outlined above.   Tests Ordered: Orders Placed This Encounter  Procedures   EKG 12-Lead    Medication Changes: No orders of the defined types were placed in this encounter.   Disposition:  Follow up 1 year in the [place] office.  Signed, [place], MD, [facility] 08/04/2019 1:24 PM    [facility] [address] [place], [place], [place] Phone: [phone]; Fax: [phone]

## 2019-08-06 ENCOUNTER — Other Ambulatory Visit (HOSPITAL_COMMUNITY): Payer: Self-pay | Admitting: Nurse Practitioner

## 2019-08-06 DIAGNOSIS — D591 Autoimmune hemolytic anemia, unspecified: Secondary | ICD-10-CM

## 2019-09-05 ENCOUNTER — Other Ambulatory Visit (HOSPITAL_COMMUNITY): Payer: Self-pay | Admitting: Nurse Practitioner

## 2019-09-05 DIAGNOSIS — D591 Autoimmune hemolytic anemia, unspecified: Secondary | ICD-10-CM

## 2019-09-15 ENCOUNTER — Inpatient Hospital Stay (HOSPITAL_COMMUNITY): Payer: Medicare Other | Attending: Hematology

## 2019-09-15 ENCOUNTER — Other Ambulatory Visit: Payer: Self-pay

## 2019-09-15 DIAGNOSIS — Z7952 Long term (current) use of systemic steroids: Secondary | ICD-10-CM | POA: Diagnosis not present

## 2019-09-15 DIAGNOSIS — E119 Type 2 diabetes mellitus without complications: Secondary | ICD-10-CM | POA: Insufficient documentation

## 2019-09-15 DIAGNOSIS — Z79899 Other long term (current) drug therapy: Secondary | ICD-10-CM | POA: Insufficient documentation

## 2019-09-15 DIAGNOSIS — Z794 Long term (current) use of insulin: Secondary | ICD-10-CM | POA: Insufficient documentation

## 2019-09-15 DIAGNOSIS — D591 Autoimmune hemolytic anemia, unspecified: Secondary | ICD-10-CM | POA: Diagnosis present

## 2019-09-15 LAB — CBC WITH DIFFERENTIAL/PLATELET
Abs Immature Granulocytes: 0.03 10*3/uL (ref 0.00–0.07)
Basophils Absolute: 0.1 10*3/uL (ref 0.0–0.1)
Basophils Relative: 1 %
Eosinophils Absolute: 0.2 10*3/uL (ref 0.0–0.5)
Eosinophils Relative: 3 %
HCT: 41.9 % (ref 39.0–52.0)
Hemoglobin: 13.5 g/dL (ref 13.0–17.0)
Immature Granulocytes: 0 %
Lymphocytes Relative: 12 %
Lymphs Abs: 0.8 10*3/uL (ref 0.7–4.0)
MCH: 32.8 pg (ref 26.0–34.0)
MCHC: 32.2 g/dL (ref 30.0–36.0)
MCV: 101.7 fL — ABNORMAL HIGH (ref 80.0–100.0)
Monocytes Absolute: 0.5 10*3/uL (ref 0.1–1.0)
Monocytes Relative: 8 %
Neutro Abs: 5.2 10*3/uL (ref 1.7–7.7)
Neutrophils Relative %: 76 %
Platelets: 221 10*3/uL (ref 150–400)
RBC: 4.12 MIL/uL — ABNORMAL LOW (ref 4.22–5.81)
RDW: 13.2 % (ref 11.5–15.5)
WBC: 6.8 10*3/uL (ref 4.0–10.5)
nRBC: 0 % (ref 0.0–0.2)

## 2019-09-15 LAB — VITAMIN D 25 HYDROXY (VIT D DEFICIENCY, FRACTURES): Vit D, 25-Hydroxy: 80.26 ng/mL (ref 30–100)

## 2019-09-15 LAB — COMPREHENSIVE METABOLIC PANEL
ALT: 34 U/L (ref 0–44)
AST: 31 U/L (ref 15–41)
Albumin: 3.9 g/dL (ref 3.5–5.0)
Alkaline Phosphatase: 75 U/L (ref 38–126)
Anion gap: 9 (ref 5–15)
BUN: 23 mg/dL (ref 8–23)
CO2: 27 mmol/L (ref 22–32)
Calcium: 9.4 mg/dL (ref 8.9–10.3)
Chloride: 105 mmol/L (ref 98–111)
Creatinine, Ser: 1.03 mg/dL (ref 0.61–1.24)
GFR calc Af Amer: >60 mL/min (ref >60)
GFR calc non Af Amer: >60 mL/min (ref >60)
Glucose, Bld: 114 mg/dL — ABNORMAL HIGH (ref 70–99)
Potassium: 4.5 mmol/L (ref 3.5–5.1)
Sodium: 141 mmol/L (ref 135–145)
Total Bilirubin: 1.3 mg/dL — ABNORMAL HIGH (ref 0.3–1.2)
Total Protein: 6.9 g/dL (ref 6.5–8.1)

## 2019-09-15 LAB — RETICULOCYTES
Immature Retic Fract: 18.5 % — ABNORMAL HIGH (ref 2.3–15.9)
RBC.: 4.17 MIL/uL — ABNORMAL LOW (ref 4.22–5.81)
Retic Count, Absolute: 168.5 10*3/uL (ref 19.0–186.0)
Retic Ct Pct: 4 % — ABNORMAL HIGH (ref 0.4–3.1)

## 2019-09-15 LAB — VITAMIN B12: Vitamin B-12: 3037 pg/mL — ABNORMAL HIGH (ref 180–914)

## 2019-09-15 LAB — LACTATE DEHYDROGENASE: LDH: 242 U/L — ABNORMAL HIGH (ref 98–192)

## 2019-09-16 LAB — HAPTOGLOBIN: Haptoglobin: <10 mg/dL — ABNORMAL LOW (ref 34–355)

## 2019-09-22 ENCOUNTER — Inpatient Hospital Stay (HOSPITAL_BASED_OUTPATIENT_CLINIC_OR_DEPARTMENT_OTHER): Payer: Medicare Other | Admitting: Hematology

## 2019-09-22 ENCOUNTER — Other Ambulatory Visit: Payer: Self-pay

## 2019-09-22 VITALS — BP 152/84 | HR 70 | Temp 97.3°F | Resp 18 | Wt 232.2 lb

## 2019-09-22 DIAGNOSIS — D591 Autoimmune hemolytic anemia, unspecified: Secondary | ICD-10-CM | POA: Diagnosis not present

## 2019-09-22 NOTE — Progress Notes (Signed)
[name] Page [address], [place] [address]   CLINIC:  Medical Oncology/Hematology  PCP:  [name] Specking, MD [address] / [place] [address]  [phone]  REASON FOR VISIT:  Follow-up for autoimmune hemolytic anemia  PRIOR THERAPY:  1. High dose prednisone in 09/2012. 2. Rituximab weekly x 4 in 07/2013.  CURRENT THERAPY: Prednisone daily  INTERVAL HISTORY:  [name] Page, a 74 y.o. 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 [name] Page was last seen on 09/15/2018.  Today he denies any easy fatigability, SOB, CP, or abnormal bleeding, including nosebleeds, hematochezia or hematuria. He continues taking prednisone 2.5 mg daily. He denies recent infections, F/C, night sweats, or unexpected weight loss.  He has received his second COVID vaccine.   REVIEW OF SYSTEMS:  Review of Systems  Constitutional: Negative for appetite change, chills, diaphoresis, fatigue, fever and unexpected weight change.  All other systems reviewed and are negative.   PAST MEDICAL/SURGICAL HISTORY:  Past Medical History:  Diagnosis Date  . Coronary atherosclerosis of native coronary artery    Multivessel, LVEF 59%  . Essential hypertension   . Hemolytic anemia (HCC)   . Hyperlipidemia   . Pernicious anemia    Past Surgical History:  Procedure Laterality Date  . CORONARY ARTERY BYPASS GRAFT  2004   LIMA to LAD, SVG to OM, SVG to PDA    SOCIAL HISTORY:  Social History   Socioeconomic History  . Marital status: Married    Spouse name: Not on file  . Number of children: Not on file  . Years of education: Not on file  . Highest education level: Not on file  Occupational History  . Occupation: Full time  Tobacco Use  . Smoking status: Former Smoker    Types: Cigarettes    Quit date: 02/11/2002    Years since quitting: 17.6  . Smokeless tobacco: Never Used  Substance and Sexual Activity  . Alcohol use: No    Alcohol/week: 0.0  standard drinks  . Drug use: No  . Sexual activity: Not on file  Other Topics Concern  . Not on file  Social History Narrative  . Not on file   Social Determinants of Health   Financial Resource Strain:   . Difficulty of Paying Living Expenses:   Food Insecurity:   . Worried About Programme researcher, broadcasting/film/video in the Last Year:   . Barista in the Last Year:   Transportation Needs:   . Freight forwarder (Medical):   Marland Kitchen Lack of Transportation (Non-Medical):   Physical Activity:   . Days of Exercise per Week:   . Minutes of Exercise per Session:   Stress:   . Feeling of Stress :   Social Connections:   . Frequency of Communication with Friends and Family:   . Frequency of Social Gatherings with Friends and Family:   . Attends Religious Services:   . Active Member of Clubs or Organizations:   . Attends Banker Meetings:   Marland Kitchen Marital Status:   Intimate Partner Violence:   . Fear of Current or Ex-Partner:   . Emotionally Abused:   Marland Kitchen Physically Abused:   . Sexually Abused:     FAMILY HISTORY:  Family History  Problem Relation Age of Onset  . Dementia Mother   . Cancer Mother        colon  . COPD Father   . Ulcerative colitis Father   .  Heart attack Father     CURRENT MEDICATIONS:  Current Outpatient Medications  Medication Sig Dispense Refill  . APPLE CIDER VINEGAR PO Take 30 mLs by mouth daily.    Marland Kitchen aspirin EC 81 MG tablet Take 81 mg by mouth daily.    . Aspirin-Acetaminophen-Caffeine (GOODYS EXTRA STRENGTH) [phone] MG PACK Take by mouth daily as needed.     Marland Kitchen b complex vitamins tablet Take 1 tablet by mouth daily.    . calcium carbonate (OS-CAL) 600 MG TABS tablet Take 600 mg by mouth 2 (two) times daily with a meal.     . Cranberry 500 MG CAPS Take 2 capsules by mouth daily.     . folic acid (FOLVITE) 1 MG tablet TAKE 1 TABLET BY MOUTH ONCE DAILY. 30 tablet 0  . GARLIC OIL PO Take 1 capsule by mouth daily.    Marland Kitchen glipiZIDE (GLUCOTROL) 5 MG tablet  Take 5 mg by mouth daily.     . insulin aspart (NOVOLOG) 100 UNIT/ML injection Inject 0-6 Units into the skin as needed.     Marland Kitchen levothyroxine (SYNTHROID, LEVOTHROID) 50 MCG tablet Take 50 mcg by mouth daily.    . Magnesium 250 MG TABS Take 1 tablet by mouth daily.    . metFORMIN (GLUCOPHAGE) 500 MG tablet     . metoprolol succinate (TOPROL-XL) 25 MG 24 hr tablet Take 25 mg by mouth daily.      . Misc Natural Products (PROSTATE CONTROL PO) Take 1 tablet by mouth daily.    . Multiple Vitamins-Minerals (MULTI FOR HIM 50+) TABS Take 1 tablet by mouth daily.     Ailene Ards 3-6-9 CAPS Take 1 capsule by mouth daily.     Letta Pate VERIO test strip     . POTASSIUM PO Take 99 mg by mouth daily.     . predniSONE (DELTASONE) 2.5 MG tablet Take 1 tablet (2.5 mg total) by mouth daily with breakfast. 30 tablet 4  . psyllium (METAMUCIL) 58.6 % powder Take 1 packet by mouth daily.     . Red Yeast Rice 600 MG TABS Take 1 tablet by mouth daily.     . Saw Palmetto 160 MG CAPS Take 1 capsule by mouth daily.     . vitamin B-12 (CYANOCOBALAMIN) 1000 MCG tablet Take 1,000 mcg by mouth daily.    . Zinc Sulfate (ZINC 15 PO) Take by mouth. Patient does not know the correct dose     No current facility-administered medications for this visit.    ALLERGIES:  No Known Allergies  PHYSICAL EXAM:  Performance status (ECOG): 1 - Symptomatic but completely ambulatory  Vitals:   09/22/19 1540  BP: (!) 152/84  Pulse: 70  Resp: 18  Temp: (!) 97.3 F (36.3 C)  SpO2: 97%   Wt Readings from Last 3 Encounters:  09/22/19 232 lb 3.2 oz (105.3 kg)  08/04/19 233 lb 9.6 oz (106 kg)  06/22/19 229 lb 2 oz (103.9 kg)   Physical Exam Vitals reviewed.  Constitutional:      Appearance: Normal appearance. He is obese.  Cardiovascular:     Rate and Rhythm: Normal rate and regular rhythm.     Pulses: Normal pulses.     Heart sounds: Normal heart sounds.  Pulmonary:     Effort: Pulmonary effort is normal.     Breath sounds:  Normal breath sounds.  Abdominal:     Palpations: Abdomen is soft. There is no hepatomegaly, splenomegaly or mass.     Tenderness:  There is no abdominal tenderness.     Hernia: No hernia is present.  Musculoskeletal:     Right lower leg: Edema (1+) present.     Left lower leg: Edema (1+) present.  Lymphadenopathy:     Cervical: No cervical adenopathy.     Upper Body:     Right upper body: No supraclavicular adenopathy.     Left upper body: No supraclavicular adenopathy.  Neurological:     General: No focal deficit present.     Mental Status: He is alert and oriented to person, place, and time.  Psychiatric:        Mood and Affect: Mood normal.        Behavior: Behavior normal.     LABORATORY DATA:  I have reviewed the labs as listed.  CBC Latest Ref Rng & Units 09/15/2019 06/15/2019 12/16/2018  WBC 4.0 - 10.5 K/uL 6.8 7.8 7.5  Hemoglobin 13.0 - 17.0 g/dL 16.1 12.7(L) 13.3  Hematocrit 39 - 52 % 41.9 39.6 41.0  Platelets 150 - 400 K/uL 221 210 241   CMP Latest Ref Rng & Units 09/15/2019 06/15/2019 12/16/2018  Glucose 70 - 99 mg/dL 096(E) 454(U) 981(X)  BUN 8 - 23 mg/dL 23 20 21   Creatinine 0.61 - 1.24 mg/dL 9.14 7.82  Sodium 135 - 145 mmol/L 141 138 142  Potassium 3.5 - 5.1 mmol/L 4.5 4.4 4.6  Chloride 98 - 111 mmol/L 105 102 105  CO2 22 - 32 mmol/L 27 26 29   Calcium 8.9 - 10.3 mg/dL 9.4 9.2 9.6  Total Protein 6.5 - 8.1 g/dL 6.9 7.0 7.1  Total Bilirubin 0.3 - 1.2 mg/dL 9.56) ) 1.9(H)  Alkaline Phos 38 - 126 U/L 75 82 83  AST 15 - 41 U/L 31 37 31  ALT 0 - 44 U/L 34 43 34      Component Value Date/Time   RBC 4.12 (L) 09/15/2019 1115   RBC 4.17 (L) 09/15/2019 1115   MCV 101.7 (H) 09/15/2019 1115   MCH 32.8 09/15/2019 1115   MCHC 32.2 09/15/2019 1115   RDW 13.2 09/15/2019 1115   LYMPHSABS 0.8 09/15/2019 1115   MONOABS 0.5 09/15/2019 1115   EOSABS 0.2 09/15/2019 1115   BASOSABS 0.1 09/15/2019 1115   Lab Results  Component Value Date   LDH 242 (H) 09/15/2019    LDH 260 (H) 06/15/2019   LDH 285 (H) 12/16/2018   Lab Results  Component Value Date   VD25OH 80.26 09/15/2019    DIAGNOSTIC IMAGING:  I have independently reviewed the scans and discussed with the patient. No results found.   ASSESSMENT:  1.  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 - Rituximab weekly x4 in June 2015. - He is on prednisone 5 mg daily, most recent taper was in May 2019 from 10 mg daily. -He took 5 mg daily for a long time.  He is currently taking prednisone 2.5 mg daily.  2.  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  He is on Glucotrol 5 mg daily, Metformin 500 mg daily.   PLAN:  1.  Autoimmune hemolytic anemia: -I have reviewed his CBC which showed hemoglobin 13.5.  LDH is elevated to 42.  Total bilirubin is 1.3.  Reticulocyte count is 4%. -I have recommended him to cut back on prednisone to 2.5 mg every other day until he sees July 2015 back in 3 months for follow-up with repeat labs.  2.  Diabetes: -Continue Glucotrol and Metformin.  Orders placed this  encounter:  No orders of the defined types were placed in this encounter.    [name] Jack, MD [place] [phone]   I, Milinda Antis, am acting as a scribe for Dr. Sanda Linger.  I, [name] Jack MD, have reviewed the above documentation for accuracy and completeness, and I agree with the above.

## 2019-09-22 NOTE — Patient Instructions (Signed)
[facility] at [facility] Discharge Instructions  You were seen today by Dr. Ellin Saba. He went over your recent results. Start taking your prednisone once every other day. Dr. Ellin Saba will see you back in 3 months for labs and follow up.   Thank you for choosing [facility] at [facility] to provide your oncology and hematology care.  To afford each patient quality time with our provider, please arrive at least 15 minutes before your scheduled appointment time.   If you have a lab appointment with the Cancer Center please come in thru the Main Entrance and check in at the main information desk  You need to re-schedule your appointment should you arrive 10 or more minutes late.  We strive to give you quality time with our providers, and arriving late affects you and other patients whose appointments are after yours.  Also, if you no show three or more times for appointments you may be dismissed from the clinic at the providers discretion.     Again, thank you for choosing [facility].  Our hope is that these requests will decrease the amount of time that you wait before being seen by our physicians.       _____________________________________________________________  Should you have questions after your visit to [facility], please contact our office at [phone] between the hours of 8:00 a.m. and 4:30 p.m.  Voicemails left after 4:00 p.m. will not be returned until the following business day.  For prescription refill requests, have your pharmacy contact our office and allow 72 hours.    Cancer Center Support Programs:   > Cancer Support Group  2nd Tuesday of the month 1pm-2pm, Journey Room

## 2019-10-05 ENCOUNTER — Other Ambulatory Visit (HOSPITAL_COMMUNITY): Payer: Self-pay | Admitting: Nurse Practitioner

## 2019-10-05 DIAGNOSIS — D591 Autoimmune hemolytic anemia, unspecified: Secondary | ICD-10-CM

## 2019-11-04 ENCOUNTER — Other Ambulatory Visit (HOSPITAL_COMMUNITY): Payer: Self-pay | Admitting: Nurse Practitioner

## 2019-11-04 DIAGNOSIS — D591 Autoimmune hemolytic anemia, unspecified: Secondary | ICD-10-CM

## 2019-12-08 ENCOUNTER — Other Ambulatory Visit (HOSPITAL_COMMUNITY): Payer: Self-pay

## 2019-12-08 DIAGNOSIS — D591 Autoimmune hemolytic anemia, unspecified: Secondary | ICD-10-CM

## 2019-12-08 MED ORDER — FOLIC ACID 1 MG PO TABS: 1.0000 mg | ORAL_TABLET | Freq: Every day | ORAL | 0 refills | Status: DC

## 2019-12-13 ENCOUNTER — Inpatient Hospital Stay (HOSPITAL_COMMUNITY): Payer: Medicare Other | Attending: Hematology

## 2019-12-13 ENCOUNTER — Other Ambulatory Visit: Payer: Self-pay

## 2019-12-13 DIAGNOSIS — D591 Autoimmune hemolytic anemia, unspecified: Secondary | ICD-10-CM | POA: Insufficient documentation

## 2019-12-13 DIAGNOSIS — Z87891 Personal history of nicotine dependence: Secondary | ICD-10-CM | POA: Insufficient documentation

## 2019-12-13 LAB — COMPREHENSIVE METABOLIC PANEL
ALT: 36 U/L (ref 0–44)
AST: 35 U/L (ref 15–41)
Albumin: 4 g/dL (ref 3.5–5.0)
Alkaline Phosphatase: 86 U/L (ref 38–126)
Anion gap: 7 (ref 5–15)
BUN: 22 mg/dL (ref 8–23)
CO2: 28 mmol/L (ref 22–32)
Calcium: 9.4 mg/dL (ref 8.9–10.3)
Chloride: 103 mmol/L (ref 98–111)
Creatinine, Ser: 1.08 mg/dL (ref 0.61–1.24)
GFR, Estimated: >60 mL/min (ref >60)
Glucose, Bld: 61 mg/dL — ABNORMAL LOW (ref 70–99)
Potassium: 4.5 mmol/L (ref 3.5–5.1)
Sodium: 138 mmol/L (ref 135–145)
Total Bilirubin: 1.5 mg/dL — ABNORMAL HIGH (ref 0.3–1.2)
Total Protein: 7.3 g/dL (ref 6.5–8.1)

## 2019-12-13 LAB — CBC WITH DIFFERENTIAL/PLATELET
Abs Immature Granulocytes: 0.04 10*3/uL (ref 0.00–0.07)
Basophils Absolute: 0.1 10*3/uL (ref 0.0–0.1)
Basophils Relative: 1 %
Eosinophils Absolute: 0.2 10*3/uL (ref 0.0–0.5)
Eosinophils Relative: 3 %
HCT: 41.8 % (ref 39.0–52.0)
Hemoglobin: 13.6 g/dL (ref 13.0–17.0)
Immature Granulocytes: 1 %
Lymphocytes Relative: 15 %
Lymphs Abs: 1.1 10*3/uL (ref 0.7–4.0)
MCH: 33.2 pg (ref 26.0–34.0)
MCHC: 32.5 g/dL (ref 30.0–36.0)
MCV: 102 fL — ABNORMAL HIGH (ref 80.0–100.0)
Monocytes Absolute: 0.7 10*3/uL (ref 0.1–1.0)
Monocytes Relative: 9 %
Neutro Abs: 5.5 10*3/uL (ref 1.7–7.7)
Neutrophils Relative %: 71 %
Platelets: 243 10*3/uL (ref 150–400)
RBC: 4.1 MIL/uL — ABNORMAL LOW (ref 4.22–5.81)
RDW: 13.8 % (ref 11.5–15.5)
WBC: 7.6 10*3/uL (ref 4.0–10.5)
nRBC: 0 % (ref 0.0–0.2)

## 2019-12-13 LAB — RETICULOCYTES
Immature Retic Fract: 21.6 % — ABNORMAL HIGH (ref 2.3–15.9)
RBC.: 4.03 MIL/uL — ABNORMAL LOW (ref 4.22–5.81)
Retic Count, Absolute: 197.1 10*3/uL — ABNORMAL HIGH (ref 19.0–186.0)
Retic Ct Pct: 4.9 % — ABNORMAL HIGH (ref 0.4–3.1)

## 2019-12-13 LAB — LACTATE DEHYDROGENASE: LDH: 252 U/L — ABNORMAL HIGH (ref 98–192)

## 2019-12-20 ENCOUNTER — Other Ambulatory Visit: Payer: Self-pay

## 2019-12-20 ENCOUNTER — Inpatient Hospital Stay (HOSPITAL_BASED_OUTPATIENT_CLINIC_OR_DEPARTMENT_OTHER): Payer: Medicare Other | Admitting: Hematology

## 2019-12-20 VITALS — BP 149/71 | HR 75 | Temp 97.0°F | Resp 18 | Wt 231.7 lb

## 2019-12-20 DIAGNOSIS — D591 Autoimmune hemolytic anemia, unspecified: Secondary | ICD-10-CM | POA: Diagnosis not present

## 2019-12-20 NOTE — Progress Notes (Signed)
[facility] [address], [place] [address]   CLINIC:  Medical Oncology/Hematology  PCP:  [name], MD [address] / [place] [address]  [phone]  REASON FOR VISIT:  Follow-up for autoimmune hemolytic anemia  PRIOR THERAPY:  1. High dose prednisone in 09/2012. 2. Rituximab weekly x 4 in 07/2013.  CURRENT THERAPY: Prednisone 2.5 mg QOD  INTERVAL HISTORY:  [name] Page, a 74 y.o. 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 [name] Page was last seen on 09/22/2019.  Today he reports feeling well. He is taking prednisone 2.5 mg QOD and denies changes in his energy levels. He reports having numbness in his feet to the point that he burned them after walking [place]. His leg swelling is stable.  He will see Dr. Sherril Croon in January for his annual physical.    REVIEW OF SYSTEMS:  Review of Systems  Constitutional: Positive for fatigue (70%). Negative for appetite change.  Cardiovascular: Positive for leg swelling.  Neurological: Positive for dizziness, headaches and numbness (feet).  Psychiatric/Behavioral: Positive for depression. The patient is nervous/anxious.   All other systems reviewed and are negative.   PAST MEDICAL/SURGICAL HISTORY:  Past Medical History:  Diagnosis Date  . Coronary atherosclerosis of native coronary artery    Multivessel, LVEF 59%  . Essential hypertension   . Hemolytic anemia (HCC)   . Hyperlipidemia   . Pernicious anemia    Past Surgical History:  Procedure Laterality Date  . CORONARY ARTERY BYPASS GRAFT  2004   LIMA to LAD, SVG to OM, SVG to PDA    SOCIAL HISTORY:  Social History   Socioeconomic History  . Marital status: Married    Spouse name: Not on file  . Number of children: Not on file  . Years of education: Not on file  . Highest education level: Not on file  Occupational History  . Occupation: Full time  Tobacco Use  . Smoking status: Former  Smoker    Types: Cigarettes    Quit date: 02/11/2002    Years since quitting: 17.8  . Smokeless tobacco: Never Used  Substance and Sexual Activity  . Alcohol use: No    Alcohol/week: 0.0 standard drinks  . Drug use: No  . Sexual activity: Not on file  Other Topics Concern  . Not on file  Social History Narrative  . Not on file   Social Determinants of Health   Financial Resource Strain:   . Difficulty of Paying Living Expenses: Not on file  Food Insecurity:   . Worried About Programme researcher, broadcasting/film/video in the Last Year: Not on file  . Ran Out of Food in the Last Year: Not on file  Transportation Needs:   . Lack of Transportation (Medical): Not on file  . Lack of Transportation (Non-Medical): Not on file  Physical Activity:   . Days of Exercise per Week: Not on file  . Minutes of Exercise per Session: Not on file  Stress:   . Feeling of Stress : Not on file  Social Connections:   . Frequency of Communication with Friends and Family: Not on file  . Frequency of Social Gatherings with Friends and Family: Not on file  . Attends Religious Services: Not on file  . Active Member of Clubs or Organizations: Not on file  . Attends Banker Meetings: Not on file  . Marital Status: Not on file  Intimate Partner Violence:   .  Fear of Current or Ex-Partner: Not on file  . Emotionally Abused: Not on file  . Physically Abused: Not on file  . Sexually Abused: Not on file    FAMILY HISTORY:  Family History  Problem Relation Age of Onset  . Dementia Mother   . Cancer Mother        colon  . COPD Father   . Ulcerative colitis Father   . Heart attack Father     CURRENT MEDICATIONS:  Current Outpatient Medications  Medication Sig Dispense Refill  . APPLE CIDER VINEGAR PO Take 30 mLs by mouth daily.    Marland Kitchen aspirin EC 81 MG tablet Take 81 mg by mouth daily.    . Aspirin-Acetaminophen-Caffeine (GOODYS EXTRA STRENGTH) [phone] MG PACK Take by mouth daily as needed.     Marland Kitchen b complex  vitamins tablet Take 1 tablet by mouth daily.    . calcium carbonate (OS-CAL) 600 MG TABS tablet Take 600 mg by mouth 2 (two) times daily with a meal.     . Cranberry 500 MG CAPS Take 2 capsules by mouth daily.     . folic acid (FOLVITE) 1 MG tablet Take 1 tablet (1 mg total) by mouth daily. 90 tablet 0  . GARLIC OIL PO Take 1 capsule by mouth daily.    Marland Kitchen glipiZIDE (GLUCOTROL) 5 MG tablet Take 5 mg by mouth daily.     . insulin aspart (NOVOLOG) 100 UNIT/ML injection Inject 0-6 Units into the skin as needed.     Marland Kitchen levothyroxine (SYNTHROID, LEVOTHROID) 50 MCG tablet Take 50 mcg by mouth daily.    . Magnesium 250 MG TABS Take 1 tablet by mouth daily.    . metFORMIN (GLUCOPHAGE) 500 MG tablet     . metoprolol succinate (TOPROL-XL) 25 MG 24 hr tablet Take 25 mg by mouth daily.      . Misc Natural Products (PROSTATE CONTROL PO) Take 1 tablet by mouth daily.    . Multiple Vitamins-Minerals (MULTI FOR HIM 50+) TABS Take 1 tablet by mouth daily.     Ailene Ards 3-6-9 CAPS Take 1 capsule by mouth daily.     Letta Pate VERIO test strip     . POTASSIUM PO Take 99 mg by mouth daily.     . predniSONE (DELTASONE) 2.5 MG tablet Take 1 tablet (2.5 mg total) by mouth daily with breakfast. 30 tablet 4  . psyllium (METAMUCIL) 58.6 % powder Take 1 packet by mouth daily.     . Red Yeast Rice 600 MG TABS Take 1 tablet by mouth daily.     . Saw Palmetto 160 MG CAPS Take 1 capsule by mouth daily.     . vitamin B-12 (CYANOCOBALAMIN) 1000 MCG tablet Take 1,000 mcg by mouth daily.    . Zinc Sulfate (ZINC 15 PO) Take by mouth. Patient does not know the correct dose     No current facility-administered medications for this visit.    ALLERGIES:  No Known Allergies  PHYSICAL EXAM:  Performance status (ECOG): 1 - Symptomatic but completely ambulatory  Vitals:   12/20/19 1539  BP: (!) 149/71  Pulse: 75  Resp: 18  Temp: (!) 97 F (36.1 C)  SpO2: 100%   Wt Readings from Last 3 Encounters:  12/20/19 231 lb 11.3 oz  (105.1 kg)  09/22/19 232 lb 3.2 oz (105.3 kg)  08/04/19 233 lb 9.6 oz (106 kg)   Physical Exam Vitals reviewed.  Constitutional:      Appearance: Normal appearance.  He is obese.  Cardiovascular:     Rate and Rhythm: Normal rate and regular rhythm.     Pulses: Normal pulses.     Heart sounds: Normal heart sounds.  Pulmonary:     Effort: Pulmonary effort is normal.     Breath sounds: Normal breath sounds.  Musculoskeletal:     Right lower leg: Edema (1+) present.     Left lower leg: Edema (1+) present.  Neurological:     General: No focal deficit present.     Mental Status: He is alert and oriented to person, place, and time.  Psychiatric:        Mood and Affect: Mood normal.        Behavior: Behavior normal.     LABORATORY DATA:  I have reviewed the labs as listed.  CBC Latest Ref Rng & Units 12/13/2019 09/15/2019 06/15/2019  WBC 4.0 - 10.5 K/uL 7.6 6.8 7.8  Hemoglobin 13.0 - 17.0 g/dL 16.1 09.6 12.7(L)  Hematocrit 39 - 52 % 41.8 41.9 39.6  Platelets 150 - 400 K/uL 243 221 210   CMP Latest Ref Rng & Units 12/13/2019 09/15/2019 06/15/2019  Glucose 70 - 99 mg/dL 04(V) 409(W) 119(J)  BUN 8 - 23 mg/dL 22 23 20   Creatinine 0.61 - 1.24 mg/dL 4.78 2.95  Sodium 135 - 145 mmol/L 138 141 138  Potassium 3.5 - 5.1 mmol/L 4.5 4.5 4.4  Chloride 98 - 111 mmol/L 103 105 102  CO2 22 - 32 mmol/L 28 27 26   Calcium 8.9 - 10.3 mg/dL 9.4 9.4 9.2  Total Protein 6.5 - 8.1 g/dL 7.3 6.9 7.0  Total Bilirubin 0.3 - 1.2 mg/dL 6.21) 1.3(H) 1.5(H)  Alkaline Phos 38 - 126 U/L 86 75 82  AST 15 - 41 U/L 35 31 37  ALT 0 - 44 U/L 36 34 43      Component Value Date/Time   RBC 4.10 (L) 12/13/2019 1344   RBC 4.03 (L) 12/13/2019 1344   MCV 102.0 (H) 12/13/2019 1344   MCH 33.2 12/13/2019 1344   MCHC 32.5 12/13/2019 1344   RDW 13.8 12/13/2019 1344   LYMPHSABS 1.1 12/13/2019 1344   MONOABS 0.7 12/13/2019 1344   EOSABS 0.2 12/13/2019 1344   BASOSABS 0.1 12/13/2019 1344   Lab Results  Component Value  Date   LDH 252 (H) 12/13/2019   LDH 242 (H) 09/15/2019   LDH 260 (H) 06/15/2019    DIAGNOSTIC IMAGING:  I have independently reviewed the scans and discussed with the patient. No results found.   ASSESSMENT:  1. Autoimmune hemolytic anemia: -Diagnosed in August 2014, treated with high-dose prednisone. -Rituximab weekly x4 in June 2015. -He is on prednisone 5 mg daily, most recent taper was in May 2019 from 10 mg daily. -He took 5 mg daily for a long time.  He is currently taking prednisone 2.5 mg daily.  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  He is on Glucotrol 5 mg daily, Metformin 500 mg daily.   PLAN:  1. Autoimmune hemolytic anemia: -He is currently taking prednisone 2.5 mg every other day. -Reviewed labs from 12/13/2019.  Hemoglobin stable at 13.6.  LDH is stable at 252.  Reticulocyte count is 4.9%.  Total bilirubin is 1.5. -I have recommended discontinuing prednisone completely.  He was told to call June 2019 and have blood drawn if he feels severely weak or short winded. -Otherwise I will see him back in 4 months for follow-up with repeat labs.  2. Diabetes: -Continue Glucotrol and Metformin.  Orders placed this encounter:  No orders of the defined types were placed in this encounter.    Derek Jack, MD [facility] [phone]   I, Milinda Antis, am acting as a scribe for Dr. Sanda Linger.  I, Derek Jack MD, have reviewed the above documentation for accuracy and completeness, and I agree with the above.

## 2019-12-20 NOTE — Patient Instructions (Signed)
[facility] at [facility] Discharge Instructions  You were seen today by Dr. Ellin Saba. He went over your recent results. You can stop taking the prednisone. Dr. Ellin Saba will see you back in 4 months for labs and follow up.   Thank you for choosing [facility] at [facility] to provide your oncology and hematology care.  To afford each patient quality time with our provider, please arrive at least 15 minutes before your scheduled appointment time.   If you have a lab appointment with the Cancer Center please come in thru the Main Entrance and check in at the main information desk  You need to re-schedule your appointment should you arrive 10 or more minutes late.  We strive to give you quality time with our providers, and arriving late affects you and other patients whose appointments are after yours.  Also, if you no show three or more times for appointments you may be dismissed from the clinic at the providers discretion.     Again, thank you for choosing [facility].  Our hope is that these requests will decrease the amount of time that you wait before being seen by our physicians.       _____________________________________________________________  Should you have questions after your visit to [facility], please contact our office at [phone] between the hours of 8:00 a.m. and 4:30 p.m.  Voicemails left after 4:00 p.m. will not be returned until the following business day.  For prescription refill requests, have your pharmacy contact our office and allow 72 hours.    Cancer Center Support Programs:   > Cancer Support Group  2nd Tuesday of the month 1pm-2pm, Journey Room

## 2020-04-11 ENCOUNTER — Telehealth: Payer: Self-pay | Admitting: *Deleted

## 2020-04-11 NOTE — Telephone Encounter (Signed)
I s/w pt and he is agreeable to pre op appt needed. Pt asked for appt to be after 05/26/20. Pt has been scheduled to see Rennis Harding, NP 05/29/20 @ 10 am. Pt thanked me for the call and the help. I will forward notes to NP for upcoming appt. Will send FYI to requesting office pt has appt 05/29/20.

## 2020-04-11 NOTE — Telephone Encounter (Signed)
   Primary Cardiologist: Nona Dell, MD  Chart reviewed as part of pre-operative protocol coverage.  Last seen in June 2021.  Because of [employer] Flight's past medical history and time since last visit, he will require a follow-up visit in order to better assess preoperative cardiovascular risk.  Pre-op covering staff: - Please schedule appointment and call patient to inform them. If patient already had an upcoming appointment within acceptable timeframe, please add "pre-op clearance" to the appointment notes so provider is aware. - Please contact requesting surgeon's office via preferred method (i.e, phone, fax) to inform them of need for appointment prior to surgery.   Tereso Newcomer, PA-C  04/11/2020, 2:22 PM

## 2020-04-11 NOTE — Telephone Encounter (Signed)
   [facility] Pre-operative Risk Assessment    HEARTCARE STAFF: - Please ensure there is not already an duplicate clearance open for this procedure. - Under Visit Info/Reason for Call, type in Other and utilize the format Clearance MM/DD/YY or Clearance TBD. Do not use dashes or single digits. - If request is for dental extraction, please clarify the # of teeth to be extracted.  Request for surgical clearance:  1. What type of surgery is being performed? Colonoscopy  2. When is this surgery scheduled? 06/02/2020  3. What type of clearance is required (medical clearance vs. Pharmacy clearance to hold med vs. Both)? Medical  4. Are there any medications that need to be held prior to surgery and how long? no  5. Practice name and name of physician performing surgery? [facility]  6. What is the office phone number? [phone]   7.   What is the office fax number? [phone]  8.   Anesthesia type (None, local, MAC, general) ? propofol   Marlou Sa 04/11/2020, 8:45 AM  _________________________________________________________________   Evaluate patient's history and advise [place] of any special consideration that should be made. Clearance for anesthesia: Low Risk or High Risk

## 2020-04-11 NOTE — Telephone Encounter (Signed)
Pt seeing you 4/18.  Please send clearance notes to surgeon. Tereso Newcomer, PA-C    04/11/2020 3:14 PM

## 2020-04-13 IMAGING — CT CT CHEST W/O CM
2 of 3 series · 15 of 36 positions shown, 18 images · non-contrast
Comparison: CT chest of 08/07/2015 and CT chest of 10/15/2012

CLINICAL DATA: Follow-up pulmonary nodule, chronic bronchitis,
cough and congestion for 2 weeks

EXAM:
CT CHEST WITHOUT CONTRAST
TECHNIQUE: Multidetector CT imaging of the chest was performed following the
standard protocol without IV contrast.

[Series 2: thorax · axial · 0.76mm/px · z∈[+1284,+1574]mm · 12 of 171 slices shown, 15 images]
[im 13/171  mediastinal]
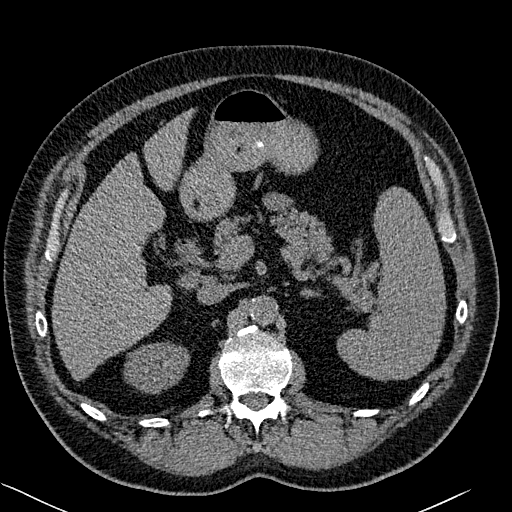
[im 13/171  lung]
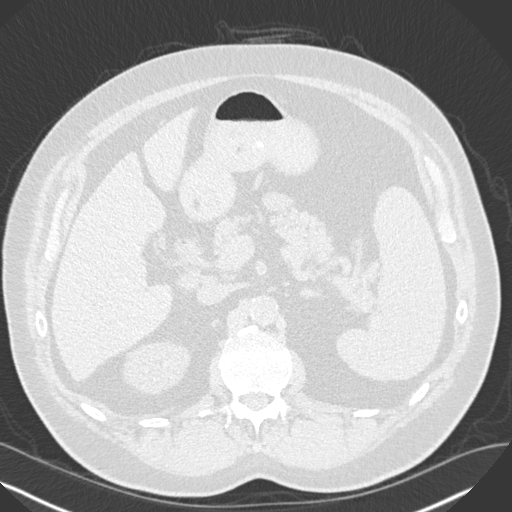
[im 26/171  lung]
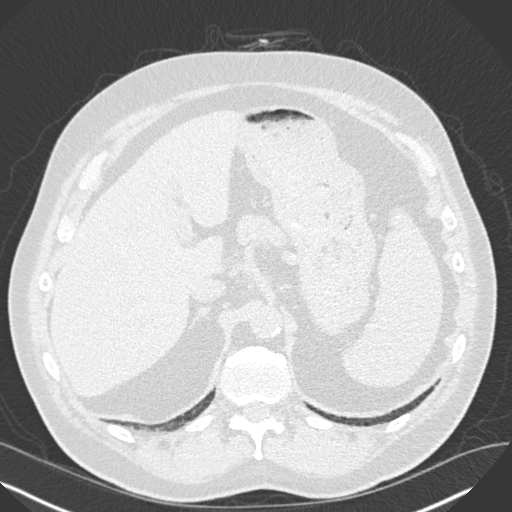
[im 38/171  lung]
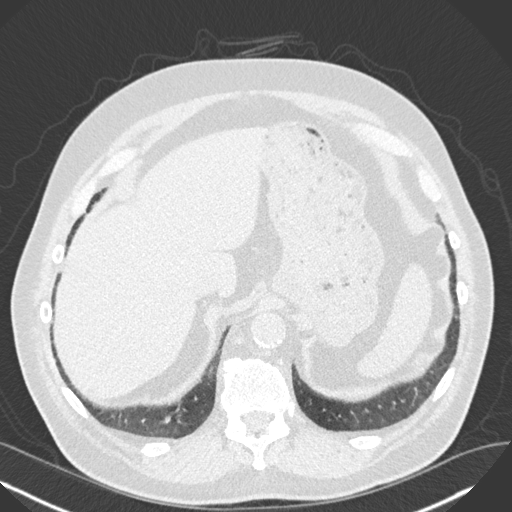
[im 51/171  lung]
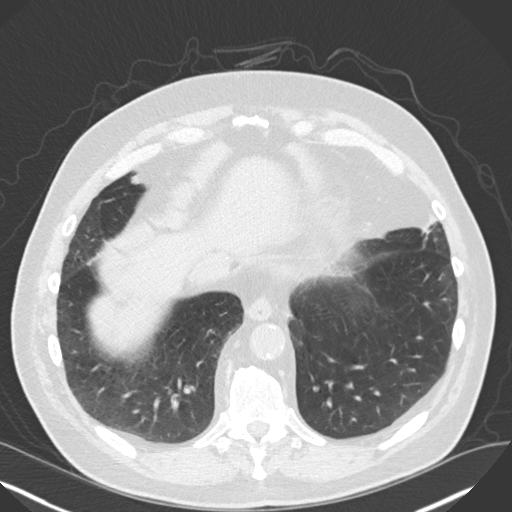
[im 63/171  mediastinal]
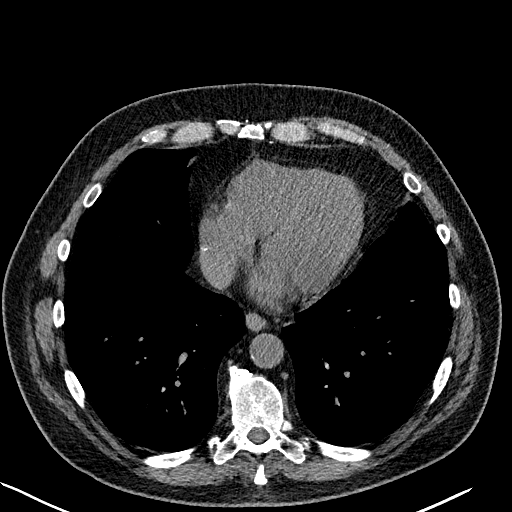
[im 63/171  lung]
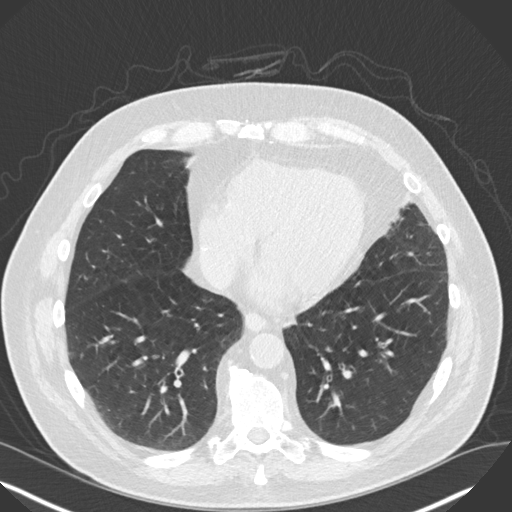
[im 76/171  lung]
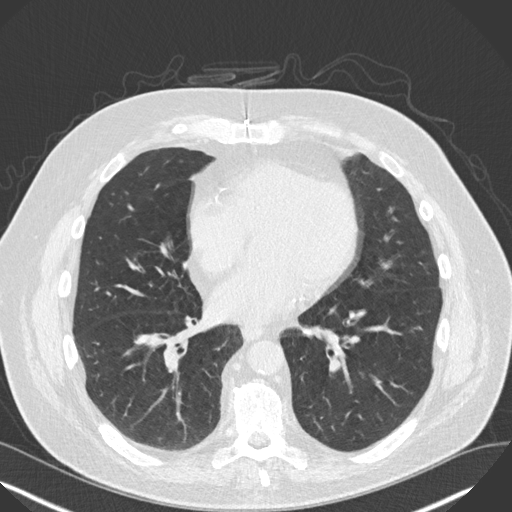
[im 95/171  lung]
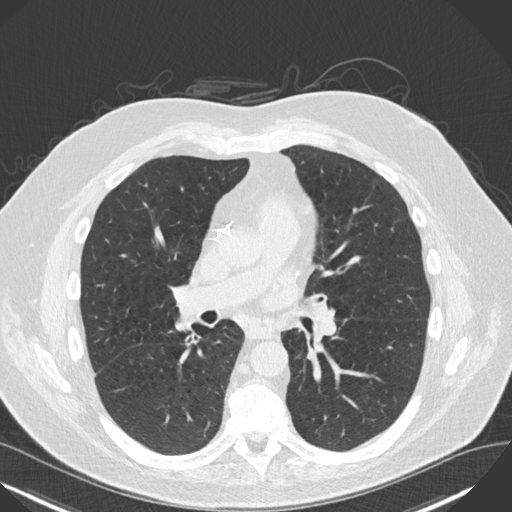
[im 108/171  lung]
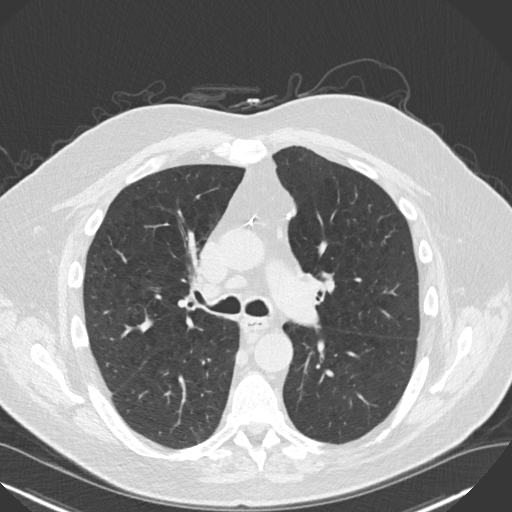
[im 120/171  mediastinal]
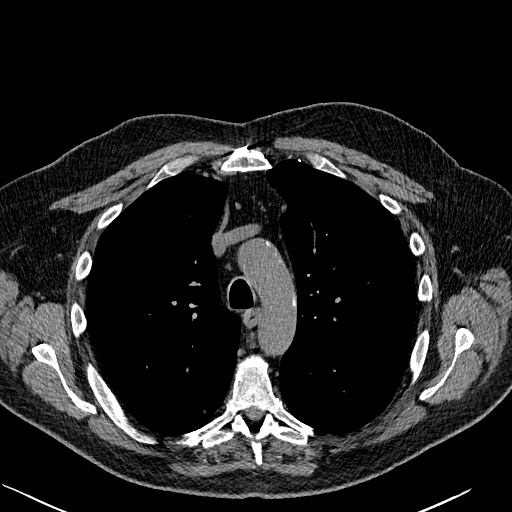
[im 120/171  lung]
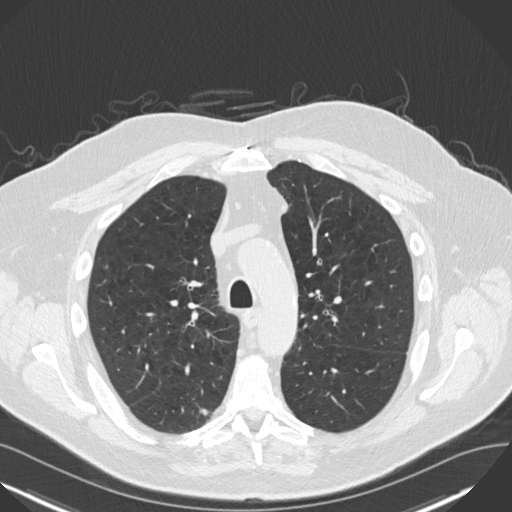
[im 133/171  lung]
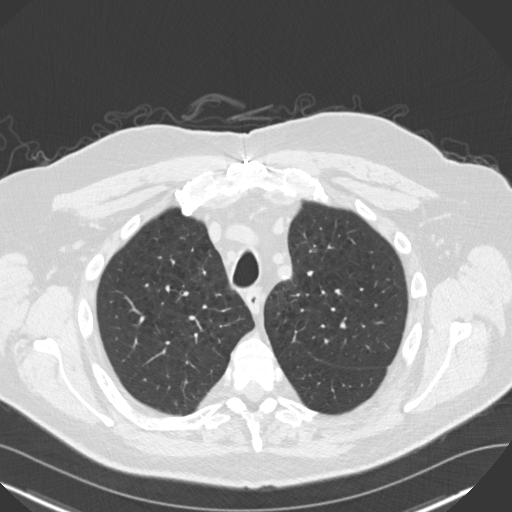
[im 145/171  lung]
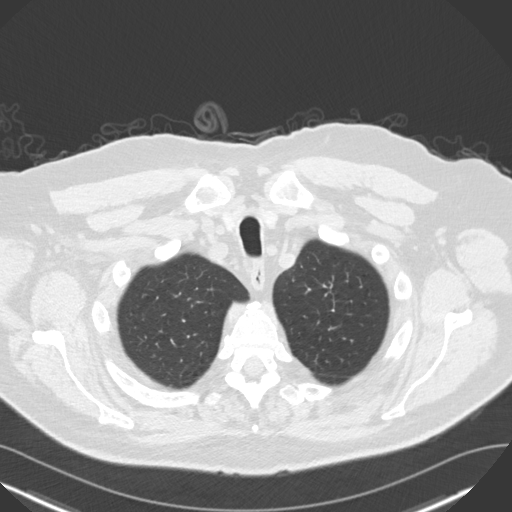
[im 158/171  lung]
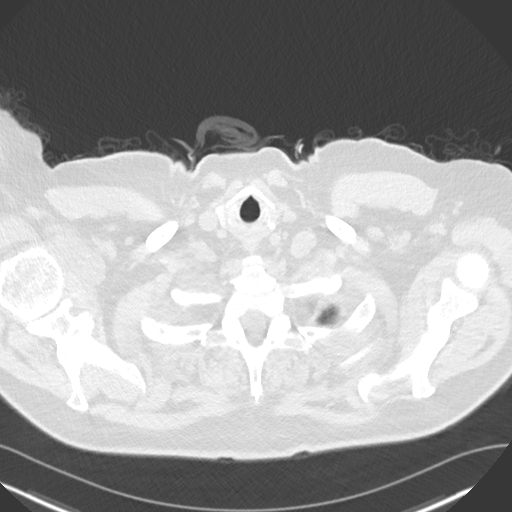

[Series 5: coronal · coronal · 0.68mm/px · 3 of 151 slices shown]
[im 31/151  lung]
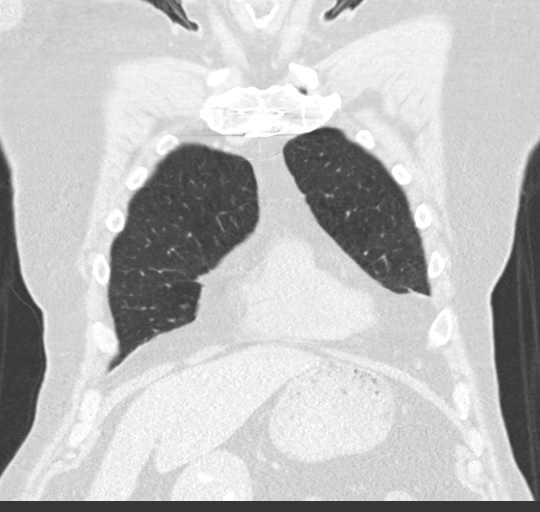
[im 61/151  lung]
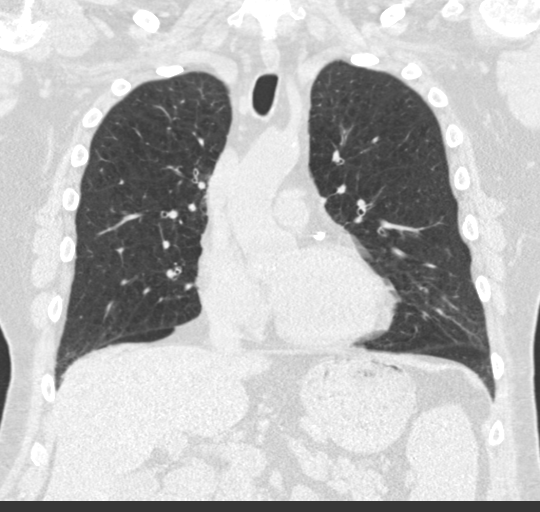
[im 91/151  lung]
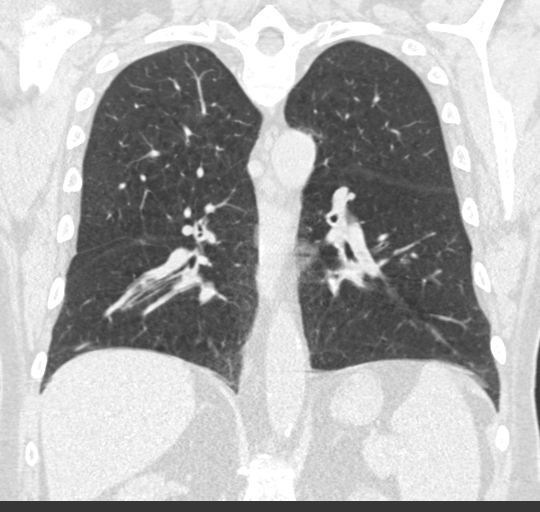

[15 of 36 positions shown; findings below may reference images not displayed]

FINDINGS: Cardiovascular: Moderate thoracic aortic atherosclerosis is present.
Median sternotomy sutures are present from prior CABG. The heart is
within normal limits in size. No pericardial effusion is seen. The
mid ascending thoracic aorta appears normal in caliber.

Mediastinum/Nodes: No mediastinal or hilar adenopathy is seen. Only
small mediastinal lymph nodes are present. The thyroid gland is
unremarkable. No hiatal hernia is seen.

Lungs/Pleura: On lung window images, the small subpleural opacity in
the superior segment of the right lower lobe is seen on images 52
and 53 of series 4. In retrospect on the study from 1281 this vague
opacity may have been present at that time and has not changed
significantly. I believe this is most likely due to scarring from
prior infection. No new or enlarging pulmonary nodule is seen. There
are diffuse changes of centrilobular emphysema noted. The central
airway is patent.

Upper Abdomen: On the limited views of the upper abdomen, no
abnormality is seen.

Musculoskeletal: There are degenerative changes in the mid to lower
thoracic spine and there is a slight thoracic kyphosis present.
IMPRESSION: 1. Stable appearance of the vague subpleural nodule in superior
segment of the right lower lobe most consistent with scarring. This
nodular opacity has been stable for 2 years, therefore is considered
benign in origin. No new or enlarging pulmonary nodule is seen.
2. Diffuse changes of centrilobular emphysema.
3. Moderate thoracic aortic atherosclerosis.

## 2020-04-18 ENCOUNTER — Inpatient Hospital Stay (HOSPITAL_COMMUNITY): Payer: Medicare Other | Attending: Hematology

## 2020-04-18 ENCOUNTER — Other Ambulatory Visit: Payer: Self-pay

## 2020-04-18 DIAGNOSIS — E1169 Type 2 diabetes mellitus with other specified complication: Secondary | ICD-10-CM | POA: Diagnosis not present

## 2020-04-18 DIAGNOSIS — Z87891 Personal history of nicotine dependence: Secondary | ICD-10-CM | POA: Diagnosis not present

## 2020-04-18 DIAGNOSIS — Z7984 Long term (current) use of oral hypoglycemic drugs: Secondary | ICD-10-CM | POA: Insufficient documentation

## 2020-04-18 DIAGNOSIS — D591 Autoimmune hemolytic anemia, unspecified: Secondary | ICD-10-CM | POA: Diagnosis not present

## 2020-04-18 DIAGNOSIS — Z7952 Long term (current) use of systemic steroids: Secondary | ICD-10-CM | POA: Insufficient documentation

## 2020-04-18 DIAGNOSIS — Z79899 Other long term (current) drug therapy: Secondary | ICD-10-CM | POA: Diagnosis not present

## 2020-04-18 LAB — COMPREHENSIVE METABOLIC PANEL
ALT: 41 U/L (ref 0–44)
AST: 39 U/L (ref 15–41)
Albumin: 3.9 g/dL (ref 3.5–5.0)
Alkaline Phosphatase: 77 U/L (ref 38–126)
Anion gap: 7 (ref 5–15)
BUN: 15 mg/dL (ref 8–23)
CO2: 27 mmol/L (ref 22–32)
Calcium: 9 mg/dL (ref 8.9–10.3)
Chloride: 104 mmol/L (ref 98–111)
Creatinine, Ser: 1.16 mg/dL (ref 0.61–1.24)
GFR, Estimated: >60 mL/min (ref >60)
Glucose, Bld: 183 mg/dL — ABNORMAL HIGH (ref 70–99)
Potassium: 4.6 mmol/L (ref 3.5–5.1)
Sodium: 138 mmol/L (ref 135–145)
Total Bilirubin: 2 mg/dL — ABNORMAL HIGH (ref 0.3–1.2)
Total Protein: 6.7 g/dL (ref 6.5–8.1)

## 2020-04-18 LAB — CBC WITH DIFFERENTIAL/PLATELET
Abs Immature Granulocytes: 0.02 10*3/uL (ref 0.00–0.07)
Basophils Absolute: 0.1 10*3/uL (ref 0.0–0.1)
Basophils Relative: 1 %
Eosinophils Absolute: 0.2 10*3/uL (ref 0.0–0.5)
Eosinophils Relative: 3 %
HCT: 35.6 % — ABNORMAL LOW (ref 39.0–52.0)
Hemoglobin: 11.8 g/dL — ABNORMAL LOW (ref 13.0–17.0)
Immature Granulocytes: 0 %
Lymphocytes Relative: 14 %
Lymphs Abs: 1 10*3/uL (ref 0.7–4.0)
MCH: 35.3 pg — ABNORMAL HIGH (ref 26.0–34.0)
MCHC: 33.1 g/dL (ref 30.0–36.0)
MCV: 106.6 fL — ABNORMAL HIGH (ref 80.0–100.0)
Monocytes Absolute: 0.6 10*3/uL (ref 0.1–1.0)
Monocytes Relative: 9 %
Neutro Abs: 4.9 10*3/uL (ref 1.7–7.7)
Neutrophils Relative %: 73 %
Platelets: 214 10*3/uL (ref 150–400)
RBC: 3.34 MIL/uL — ABNORMAL LOW (ref 4.22–5.81)
RDW: 13.3 % (ref 11.5–15.5)
WBC: 6.7 10*3/uL (ref 4.0–10.5)
nRBC: 0 % (ref 0.0–0.2)

## 2020-04-18 LAB — RETICULOCYTES
Immature Retic Fract: 24.3 % — ABNORMAL HIGH (ref 2.3–15.9)
RBC.: 3.36 MIL/uL — ABNORMAL LOW (ref 4.22–5.81)
Retic Count, Absolute: 275.5 10*3/uL — ABNORMAL HIGH (ref 19.0–186.0)
Retic Ct Pct: 8.2 % — ABNORMAL HIGH (ref 0.4–3.1)

## 2020-04-18 LAB — LACTATE DEHYDROGENASE: LDH: 281 U/L — ABNORMAL HIGH (ref 98–192)

## 2020-04-25 ENCOUNTER — Inpatient Hospital Stay (HOSPITAL_COMMUNITY): Payer: Medicare Other | Admitting: Hematology

## 2020-04-25 ENCOUNTER — Other Ambulatory Visit: Payer: Self-pay

## 2020-04-25 VITALS — BP 169/66 | HR 77 | Temp 97.3°F | Resp 20 | Wt 239.4 lb

## 2020-04-25 DIAGNOSIS — D591 Autoimmune hemolytic anemia, unspecified: Secondary | ICD-10-CM | POA: Diagnosis not present

## 2020-04-25 NOTE — Patient Instructions (Signed)
[facility] at [facility] Discharge Instructions  You were seen today by Dr. Ellin Saba. He went over your recent results. Take loratadine for several days and if your running nose does not improve, please call the office to inform us. Dr. Ellin Saba will see you back in 2 months for labs and follow up.   Thank you for choosing [facility] at [facility] to provide your oncology and hematology care.  To afford each patient quality time with our provider, please arrive at least 15 minutes before your scheduled appointment time.   If you have a lab appointment with the Cancer Center please come in thru the Main Entrance and check in at the main information desk  You need to re-schedule your appointment should you arrive 10 or more minutes late.  We strive to give you quality time with our providers, and arriving late affects you and other patients whose appointments are after yours.  Also, if you no show three or more times for appointments you may be dismissed from the clinic at the providers discretion.     Again, thank you for choosing [facility].  Our hope is that these requests will decrease the amount of time that you wait before being seen by our physicians.       _____________________________________________________________  Should you have questions after your visit to [facility], please contact our office at [phone] between the hours of 8:00 a.m. and 4:30 p.m.  Voicemails left after 4:00 p.m. will not be returned until the following business day.  For prescription refill requests, have your pharmacy contact our office and allow 72 hours.    Cancer Center Support Programs:   > Cancer Support Group  2nd Tuesday of the month 1pm-2pm, Journey Room

## 2020-04-25 NOTE — Progress Notes (Signed)
[name] Page [address] [place], [place] [address]   CLINIC:  Medical Oncology/Hematology  PCP:  [name], MD [address] / [place] [address]  [phone]  REASON FOR VISIT:  Follow-up for autoimmune hemolytic anemia  PRIOR THERAPY:  1. High dose prednisone in 09/2012. 2. Rituximab weekly x 4 in 07/2013. 3. Prednisone taper through 12/2019.  CURRENT THERAPY: Observation  INTERVAL HISTORY:  [name] Page, a 75 y.o. 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 [name] Page was last seen on 12/20/2019.  Today he reports feeling okay. He notes that his energy levels have gone down slightly. He stopped taking glipizide and insulin since his prednisone was tapered off. 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 He is taking vitamin B12 1,000 mcg along with a multi-vitamin that also contains B12. His leg swelling is stable. He notes that he is having a productive cough with yellow sputum and post-nasal drip for the past 3 days. He took a course of Z-pack and loratadine in January for similar symptoms which helped.   REVIEW OF SYSTEMS:  Review of Systems  Constitutional: Positive for fatigue (75%). Negative for appetite change.  HENT:   Positive for sore throat.   Respiratory: Positive for cough.   Cardiovascular: Positive for leg swelling (stable).  Neurological: Positive for numbness (feet).  All other systems reviewed and are negative.   PAST MEDICAL/SURGICAL HISTORY:  Past Medical History:  Diagnosis Date  . Coronary atherosclerosis of native coronary artery    Multivessel, LVEF 59%  . Essential hypertension   . Hemolytic anemia (HCC)   . Hyperlipidemia   . Pernicious anemia    Past Surgical History:  Procedure Laterality Date  . CORONARY ARTERY BYPASS GRAFT  2004   LIMA to LAD, SVG to OM, SVG to PDA    SOCIAL HISTORY:  Social History   Socioeconomic History  . Marital status:  Married    Spouse name: Not on file  . Number of children: Not on file  . Years of education: Not on file  . Highest education level: Not on file  Occupational History  . Occupation: Full time  Tobacco Use  . Smoking status: Former Smoker    Types: Cigarettes    Quit date: 02/11/2002    Years since quitting: 18.2  . Smokeless tobacco: Never Used  Substance and Sexual Activity  . Alcohol use: No    Alcohol/week: 0.0 standard drinks  . Drug use: No  . Sexual activity: Not on file  Other Topics Concern  . Not on file  Social History Narrative  . Not on file   Social Determinants of Page   Financial Resource Strain: Low Risk   . Difficulty of Paying Living Expenses: Not hard at all  Food Insecurity: No Food Insecurity  . Worried About Programme researcher, broadcasting/film/video in the Last Year: Never true  . Ran Out of Food in the Last Year: Never true  Transportation Needs: No Transportation Needs  . Lack of Transportation (Medical): No  . Lack of Transportation (Non-Medical): No  Physical Activity: Inactive  . Days of Exercise per Week: 0 days  . Minutes of Exercise per Session: 0 min  Stress: No Stress Concern Present  . Feeling of Stress : Not at all  Social Connections: Moderately Integrated  . Frequency of Communication with Friends and Family: More than three times a week  . Frequency  of Social Gatherings with Friends and Family: Once a week  . Attends Religious Services: More than 4 times per year  . Active Member of Clubs or Organizations: Yes  . Attends BankerClub or Organization Meetings: Never  . Marital Status: Widowed  Intimate Partner Violence: Not At Risk  . Fear of Current or Ex-Partner: No  . Emotionally Abused: No  . Physically Abused: No  . Sexually Abused: No    FAMILY HISTORY:  Family History  Problem Relation Age of Onset  . Dementia Mother   . Cancer Mother        colon  . COPD Father   . Ulcerative colitis Father   . Heart attack Father     CURRENT MEDICATIONS:   Current Outpatient Medications  Medication Sig Dispense Refill  . APPLE CIDER VINEGAR PO Take 30 mLs by mouth daily.    Marland Kitchen. aspirin EC 81 MG tablet Take 81 mg by mouth daily.    . Aspirin-Acetaminophen-Caffeine [id number] MG PACK Take by mouth daily as needed.     Marland Kitchen. b complex vitamins tablet Take 1 tablet by mouth daily.    . calcium carbonate (OS-CAL) 600 MG TABS tablet Take 600 mg by mouth 2 (two) times daily with a meal.     . Cranberry 500 MG CAPS Take 2 capsules by mouth daily.     Marland Kitchen. GARLIC OIL PO Take 1 capsule by mouth daily.    Marland Kitchen. glipiZIDE (GLUCOTROL) 5 MG tablet Take 5 mg by mouth daily.     . insulin aspart (NOVOLOG) 100 UNIT/ML injection Inject 0-6 Units into the skin as needed.     Marland Kitchen. levothyroxine (SYNTHROID, LEVOTHROID) 50 MCG tablet Take 50 mcg by mouth daily.    . Magnesium 250 MG TABS Take 1 tablet by mouth daily.    . metFORMIN (GLUCOPHAGE) 500 MG tablet     . metoprolol succinate (TOPROL-XL) 25 MG 24 hr tablet Take 25 mg by mouth daily.    . Misc Natural Products (PROSTATE CONTROL PO) Take 1 tablet by mouth daily.    . Multiple Vitamins-Minerals (MULTI FOR HIM 50+) TABS Take 1 tablet by mouth daily.     Ailene Ards. Omega 3-6-9 CAPS Take 1 capsule by mouth daily.     Letta Pate. ONETOUCH VERIO test strip     . POTASSIUM PO Take 99 mg by mouth daily.     . predniSONE (DELTASONE) 2.5 MG tablet Take 1 tablet (2.5 mg total) by mouth daily with breakfast. 30 tablet 4  . psyllium (METAMUCIL) 58.6 % powder Take 1 packet by mouth daily.     . Red Yeast Rice 600 MG TABS Take 1 tablet by mouth daily.     . rosuvastatin (CRESTOR) 5 MG tablet Take by mouth.    . Saw Palmetto 160 MG CAPS Take 1 capsule by mouth daily.     . vitamin B-12 (CYANOCOBALAMIN) 1000 MCG tablet Take 1,000 mcg by mouth daily.    . Zinc Sulfate (ZINC 15 PO) Take by mouth. Patient does not know the correct dose     No current facility-administered medications for this visit.    ALLERGIES:  No Known Allergies  PHYSICAL EXAM:   Performance status (ECOG): 1 - Symptomatic but completely ambulatory  Vitals:   04/25/20 1137  BP: (!) 169/66  Pulse: 77  Resp: 20  Temp: (!) 97.3 F (36.3 C)  SpO2: 99%   Wt Readings from Last 3 Encounters:  04/25/20 239 lb 6.4 oz (108.6 kg)  12/20/19 231 lb 11.3 oz (105.1 kg)  09/22/19 232 lb 3.2 oz (105.3 kg)   Physical Exam Vitals reviewed.  Constitutional:      Appearance: Normal appearance. He is obese.  Cardiovascular:     Rate and Rhythm: Normal rate and regular rhythm.     Pulses: Normal pulses.     Heart sounds: Normal heart sounds.  Pulmonary:     Effort: Pulmonary effort is normal.     Breath sounds: Normal breath sounds.  Musculoskeletal:     Right lower leg: Edema (1+) present.     Left lower leg: Edema (1+) present.  Neurological:     General: No focal deficit present.     Mental Status: He is alert and oriented to person, place, and time.  Psychiatric:        Mood and Affect: Mood normal.        Behavior: Behavior normal.     LABORATORY DATA:  I have reviewed the labs as listed.  CBC Latest Ref Rng & Units 04/18/2020 12/13/2019 09/15/2019  WBC 4.0 - 10.5 K/uL 6.7 7.6 6.8  Hemoglobin 13.0 - 17.0 g/dL 11.8(L) 13.6 13.5  Hematocrit 39.0 - 52.0 % 35.6(L) 41.8 41.9  Platelets 150 - 400 K/uL 214 243 221   CMP Latest Ref Rng & Units 04/18/2020 12/13/2019 09/15/2019  Glucose 70 - 99 mg/dL 865(H) 84(O) 962(X)  BUN 8 - 23 mg/dL 15 22 23   Creatinine 0.61 - 1.24 mg/dL 5.28 4.13  Sodium 135 - 145 mmol/L 138 138 141  Potassium 3.5 - 5.1 mmol/L 4.6 4.5 4.5  Chloride 98 - 111 mmol/L 104 103 105  CO2 22 - 32 mmol/L 27 28 27   Calcium 8.9 - 10.3 mg/dL 9.0 9.4 9.4  Total Protein 6.5 - 8.1 g/dL 6.7 7.3 6.9  Total Bilirubin 0.3 - 1.2 mg/dL 2.0(H) 1.5(H) 1.3(H)  Alkaline Phos 38 - 126 U/L 77 86 75  AST 15 - 41 U/L 39 35 31  ALT 0 - 44 U/L 41 36 34      Component Value Date/Time   RBC 3.34 (L) 04/18/2020 1116   RBC 3.36 (L) 04/18/2020 1116   MCV 106.6 (H)  04/18/2020 1116   MCH 35.3 (H) 04/18/2020 1116   MCHC 33.1 04/18/2020 1116   RDW 13.3 04/18/2020 1116   LYMPHSABS 1.0 04/18/2020 1116   MONOABS 0.6 04/18/2020 1116   EOSABS 0.2 04/18/2020 1116   BASOSABS 0.1 04/18/2020 1116   Lab Results  Component Value Date   LDH 281 (H) 04/18/2020   LDH 252 (H) 12/13/2019   LDH 242 (H) 09/15/2019    DIAGNOSTIC IMAGING:  I have independently reviewed the scans and discussed with the patient. No results found.   ASSESSMENT:  1. Autoimmune hemolytic anemia: -Diagnosed in August 2014, treated with high-dose prednisone. -Rituximab weekly x4 in June 2015. -He is on prednisone 5 mg daily, most recent taper was in May 2019 from 10 mg daily. -He took 5 mg daily for a long time. He is currently taking prednisone 2.5 mg daily.  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 He is on Glucotrol 5 mg daily, Metformin 500 mg daily.   PLAN:  1. Autoimmune hemolytic anemia: -Low-dose prednisone was completely tapered off at last visit in November 2021. -He does not report any chest pains or lightheadedness. -He had to take Z-Pak in January for sinus infection. -Reviewed labs which showed slightly increased LDH of 281.  Hemoglobin slightly decreased 11.8.  Reticulocyte count was 8%.  Total bilirubin is slightly elevated at 2.0. -Overall there is slight increase in hemolysis.  We will plan to see him back in 2 months for follow-up with repeat hemolysis labs, B12, methylmalonic acid, direct Coombs test along with LDH and reticulocyte's.  2. Diabetes: -Continue Glucotrol and Metformin.  Orders placed this encounter:  Orders Placed This Encounter  Procedures  . CBC with Differential/Platelet  . Comprehensive metabolic panel  . Lactate dehydrogenase  . Reticulocytes  . Methylmalonic acid, serum  . Vitamin B12  . Direct antiglobulin test     Doreatha Massed, MD [facility] [phone]   I, Drue Second, am acting as a  scribe for Dr. Payton Mccallum.  I, Doreatha Massed MD, have reviewed the above documentation for accuracy and completeness, and I agree with the above.

## 2020-05-28 NOTE — Progress Notes (Signed)
Cardiology Office Note  Date: 05/29/2020   ID: Jeffry, Vogelsang 1946-01-04, MRN [id number]  PCP:  [name], MD  Cardiologist:  Nona Dell, MD Electrophysiologist:  None   Chief Complaint: Pre op clearance  History of Present Illness: ADYEN BIFULCO is a 75 y.o. male with a history of CAD (History of multivessel s/p CABG 2004), HTN, HLD, anemia.   Last seen by Dr Diona Browner 08/04/2019. No report of active angina on medical therapy. Follow up Myoview from June 2020 overall low risk. 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 Continue observation and medical therapy including aspirin and Toprol XL. Was taking red yeast rice for hyperlipidemia due to statin intolerance. Following hematology for hemolytic anemia.   He is pending Colonoscopy at [facility] Dr Lorenza Burton Phone [phone] : Fax [phone]  He is here today for preop clearance to undergo colonoscopy as noted above.  He denies any recent issues with anginal or exertional symptoms.  States he has been taking prednisone which is causing his blood sugars to elevate.  Denies any recent medication changes.  Blood pressure has improved some since last 2 measurements.  Blood pressure today is 138/78.  EKG today shows normal sinus rhythm with a rate of 72, inferior infarct age undetermined, anterolateral infarct, age undetermined.  Denies any orthostatic symptoms, CVA or TIA-like symptoms.  He denies any bleeding issues but he does have hemolytic anemia.  States his recent hemoglobin and hematocrit are decreased from previous measurements.  He continues to see hematology.  Denies any claudication-like symptoms, DVT or PE-like symptoms.  Has some mild lower extremity edema.  Recent hemoglobin on 04/18/2020 was 11.8 and hematocrit 35.6.    Past Medical History:  Diagnosis Date  . Coronary atherosclerosis of native coronary artery    Multivessel, LVEF 59%  . Essential hypertension   . Hemolytic anemia  (HCC)   . Hyperlipidemia   . Pernicious anemia     Past Surgical History:  Procedure Laterality Date  . CORONARY ARTERY BYPASS GRAFT  2004   LIMA to LAD, SVG to OM, SVG to PDA    Current Outpatient Medications  Medication Sig Dispense Refill  . APPLE CIDER VINEGAR PO Take 30 mLs by mouth daily.    Marland Kitchen aspirin EC 81 MG tablet Take 81 mg by mouth daily.    . Aspirin-Acetaminophen-Caffeine 366-294-76 MG PACK Take by mouth daily as needed.     Marland Kitchen b complex vitamins tablet Take 1 tablet by mouth daily.    . calcium carbonate (OS-CAL) 600 MG TABS tablet Take 600 mg by mouth 2 (two) times daily with a meal.     . Cranberry 500 MG CAPS Take 2 capsules by mouth daily.     Marland Kitchen GARLIC OIL PO Take 1 capsule by mouth daily.    Marland Kitchen levothyroxine (SYNTHROID, LEVOTHROID) 50 MCG tablet Take 50 mcg by mouth daily.    . Magnesium 250 MG TABS Take 1 tablet by mouth daily.    . metFORMIN (GLUCOPHAGE) 500 MG tablet     . metoprolol succinate (TOPROL-XL) 25 MG 24 hr tablet Take 25 mg by mouth daily.    . Misc Natural Products (PROSTATE CONTROL PO) Take 1 tablet by mouth daily.    . Multiple Vitamins-Minerals (MULTI FOR HIM 50+) TABS Take 1 tablet by mouth daily.     Ailene Ards 3-6-9 CAPS Take 1 capsule by mouth daily.     Letta Pate VERIO test strip     .  POTASSIUM PO Take 99 mg by mouth daily.     . psyllium (METAMUCIL) 58.6 % powder Take 1 packet by mouth daily.     . Red Yeast Rice 600 MG TABS Take 1 tablet by mouth daily.     . rosuvastatin (CRESTOR) 5 MG tablet Take by mouth.    . Saw Palmetto 160 MG CAPS Take 1 capsule by mouth daily.     . vitamin B-12 (CYANOCOBALAMIN) 1000 MCG tablet Take 1,000 mcg by mouth daily.    . Zinc Sulfate (ZINC 15 PO) Take by mouth. Patient does not know the correct dose     No current facility-administered medications for this visit.   Allergies:  Patient has no known allergies.   Social History: The patient  reports that he quit smoking about 18 years ago. His smoking use  included cigarettes. He has never used smokeless tobacco. He reports that he does not drink alcohol and does not use drugs.   Family History: The patient's family history includes COPD in his father; Cancer in his mother; Dementia in his mother; Heart attack in his father; Ulcerative colitis in his father.   ROS:  Please see the history of present illness. Otherwise, complete review of systems is positive for none.  All other systems are reviewed and negative.   Physical Exam: VS:  BP 138/78   Pulse 71   Ht 6\' 1"  (1.854 m)   Wt 233 lb (105.7 kg)   SpO2 99%   BMI 30.74 kg/m , BMI Body mass index is 30.74 kg/m.  Wt Readings from Last 3 Encounters:  05/29/20 233 lb (105.7 kg)  04/25/20 239 lb 6.4 oz (108.6 kg)  12/20/19 231 lb 11.3 oz (105.1 kg)    General: Patient appears comfortable at rest. Neck: Supple, no elevated JVP or carotid bruits, no thyromegaly. Lungs: Clear to auscultation, nonlabored breathing at rest. Cardiac: Regular rate and rhythm, no S3 or significant systolic murmur, no pericardial rub. Extremities: No pitting edema, distal pulses 2+. Skin: Warm and dry. Musculoskeletal: No kyphosis. Neuropsychiatric: Alert and oriented x3, affect grossly appropriate.  ECG: 05/29/2020 EKG normal sinus rhythm rate of 72, inferior infarct, age undetermined, anterolateral infarct, age undetermined.  Recent Labwork: 04/18/2020: ALT 41; AST 39; BUN 15; Creatinine, Ser 1.16; Hemoglobin 11.8; Platelets 214; Potassium 4.6; Sodium 138  No results found for: CHOL, TRIG, HDL, CHOLHDL, VLDL, LDLCALC, LDLDIRECT  Other Studies Reviewed Today:   Lexiscan Myoview 08/10/2018:  No diagnostic ST segment changes to indicate ischemia. Occasional PACs and PVCs were noted without sustained arrhythmia.  Moderate sized, severe intensity, fixed inferior defect also involving the inferior septal wall at the base most consistent with infarct scar. There is no active ischemia.  This is a low risk  study.  Nuclear stress EF: 58%.   Assessment and Plan:  1. Preoperative clearance   2. Mixed hyperlipidemia   3. Coronary artery disease involving native coronary artery of native heart with angina pectoris (HCC)   4. Essential hypertension    1. Preoperative clearance He will soon be undergoing colonoscopy with Dr. 08/12/2018 at [facility].  Revised cardiac risk index = 1 placing the patient had a perioperative risk of major cardiac event at 0.9%.  His Duke activity status index score = 37.45 giving him a functional capacity and METS at 7.34.  From a cardiac standpoint patient is clear to undergo colonoscopy.  2. Mixed hyperlipidemia Continue Crestor 5 mg p.o. daily.  3. Coronary artery disease involving native  coronary artery of native heart with angina pectoris (HCC) No anginal or exertional symptoms.  Continue aspirin 81 mg daily.  Continue Toprol-XL 25 mg p.o. daily.  4. Essential hypertension Blood pressure today 138/78.  Continue Toprol-XL 25 mg daily.  Medication Adjustments/Labs and Tests Ordered: Current medicines are reviewed at length with the patient today.  Concerns regarding medicines are outlined above.   Disposition: Follow-up with Dr. Diona Browner or APP 6 months  Signed, Rennis Harding, NP 05/29/2020 10:37 AM    [facility] at [facility] [address] [place], [place], [place] [address] Phone: [phone]; Fax: [phone]

## 2020-05-29 ENCOUNTER — Ambulatory Visit (INDEPENDENT_AMBULATORY_CARE_PROVIDER_SITE_OTHER): Payer: Medicare Other | Admitting: Family Medicine

## 2020-05-29 ENCOUNTER — Other Ambulatory Visit: Payer: Self-pay

## 2020-05-29 ENCOUNTER — Encounter: Payer: Self-pay | Admitting: Family Medicine

## 2020-05-29 VITALS — BP 138/78 | HR 71 | Ht 73.0 in | Wt 233.0 lb

## 2020-05-29 DIAGNOSIS — I10 Essential (primary) hypertension: Secondary | ICD-10-CM

## 2020-05-29 DIAGNOSIS — Z01818 Encounter for other preprocedural examination: Secondary | ICD-10-CM

## 2020-05-29 DIAGNOSIS — I25119 Atherosclerotic heart disease of native coronary artery with unspecified angina pectoris: Secondary | ICD-10-CM

## 2020-05-29 DIAGNOSIS — E782 Mixed hyperlipidemia: Secondary | ICD-10-CM | POA: Diagnosis not present

## 2020-05-29 NOTE — Patient Instructions (Addendum)
Your physician recommends that you schedule a follow-up appointment in: 6 MONTHS WITH DR MCDOWELL  Your physician recommends that you continue on your current medications as directed. Please refer to the Current Medication list given to you today.  Thank you for choosing [facility]!!    

## 2020-06-05 ENCOUNTER — Other Ambulatory Visit (HOSPITAL_COMMUNITY): Payer: Self-pay | Admitting: Hematology

## 2020-06-05 DIAGNOSIS — D591 Autoimmune hemolytic anemia, unspecified: Secondary | ICD-10-CM

## 2020-06-28 ENCOUNTER — Other Ambulatory Visit: Payer: Self-pay

## 2020-06-28 ENCOUNTER — Inpatient Hospital Stay (HOSPITAL_COMMUNITY): Payer: Medicare Other | Attending: Hematology

## 2020-06-28 DIAGNOSIS — D591 Autoimmune hemolytic anemia, unspecified: Secondary | ICD-10-CM | POA: Diagnosis not present

## 2020-06-28 DIAGNOSIS — Z79899 Other long term (current) drug therapy: Secondary | ICD-10-CM | POA: Diagnosis not present

## 2020-06-28 DIAGNOSIS — E119 Type 2 diabetes mellitus without complications: Secondary | ICD-10-CM | POA: Insufficient documentation

## 2020-06-28 DIAGNOSIS — Z7984 Long term (current) use of oral hypoglycemic drugs: Secondary | ICD-10-CM | POA: Diagnosis not present

## 2020-06-28 LAB — CBC WITH DIFFERENTIAL/PLATELET
Abs Immature Granulocytes: 0.04 10*3/uL (ref 0.00–0.07)
Basophils Absolute: 0.1 10*3/uL (ref 0.0–0.1)
Basophils Relative: 1 %
Eosinophils Absolute: 0.3 10*3/uL (ref 0.0–0.5)
Eosinophils Relative: 3 %
HCT: 36.3 % — ABNORMAL LOW (ref 39.0–52.0)
Hemoglobin: 12.1 g/dL — ABNORMAL LOW (ref 13.0–17.0)
Immature Granulocytes: 1 %
Lymphocytes Relative: 9 %
Lymphs Abs: 0.8 10*3/uL (ref 0.7–4.0)
MCH: 34.8 pg — ABNORMAL HIGH (ref 26.0–34.0)
MCHC: 33.3 g/dL (ref 30.0–36.0)
MCV: 104.3 fL — ABNORMAL HIGH (ref 80.0–100.0)
Monocytes Absolute: 0.7 10*3/uL (ref 0.1–1.0)
Monocytes Relative: 8 %
Neutro Abs: 6.8 10*3/uL (ref 1.7–7.7)
Neutrophils Relative %: 78 %
Platelets: 219 10*3/uL (ref 150–400)
RBC: 3.48 MIL/uL — ABNORMAL LOW (ref 4.22–5.81)
RDW: 13.4 % (ref 11.5–15.5)
WBC: 8.7 10*3/uL (ref 4.0–10.5)
nRBC: 0 % (ref 0.0–0.2)

## 2020-06-28 LAB — COMPREHENSIVE METABOLIC PANEL
ALT: 30 U/L (ref 0–44)
AST: 33 U/L (ref 15–41)
Albumin: 3.7 g/dL (ref 3.5–5.0)
Alkaline Phosphatase: 75 U/L (ref 38–126)
Anion gap: 8 (ref 5–15)
BUN: 19 mg/dL (ref 8–23)
CO2: 25 mmol/L (ref 22–32)
Calcium: 8.7 mg/dL — ABNORMAL LOW (ref 8.9–10.3)
Chloride: 103 mmol/L (ref 98–111)
Creatinine, Ser: 1.18 mg/dL (ref 0.61–1.24)
GFR, Estimated: >60 mL/min (ref >60)
Glucose, Bld: 191 mg/dL — ABNORMAL HIGH (ref 70–99)
Potassium: 4.1 mmol/L (ref 3.5–5.1)
Sodium: 136 mmol/L (ref 135–145)
Total Bilirubin: 1.9 mg/dL — ABNORMAL HIGH (ref 0.3–1.2)
Total Protein: 6.8 g/dL (ref 6.5–8.1)

## 2020-06-28 LAB — LACTATE DEHYDROGENASE: LDH: 253 U/L — ABNORMAL HIGH (ref 98–192)

## 2020-06-28 LAB — VITAMIN B12: Vitamin B-12: 1875 pg/mL — ABNORMAL HIGH (ref 180–914)

## 2020-06-28 LAB — RETICULOCYTES
Immature Retic Fract: 25.5 % — ABNORMAL HIGH (ref 2.3–15.9)
RBC.: 3.5 MIL/uL — ABNORMAL LOW (ref 4.22–5.81)
Retic Count, Absolute: 291.2 10*3/uL — ABNORMAL HIGH (ref 19.0–186.0)
Retic Ct Pct: 8.3 % — ABNORMAL HIGH (ref 0.4–3.1)

## 2020-07-04 NOTE — Progress Notes (Signed)
[facility] [address], [place] [address]   CLINIC:  Medical Oncology/Hematology  PCP:  [name], MD [address] / [place] [address]  [phone]  REASON FOR VISIT:  Follow-up for autoimmune hemolytic anemia  PRIOR THERAPY:  1. High dose prednisone in 09/2012. 2. Rituximab weekly x 4 in 07/2013. 3. Prednisone taper through 12/2019.  CURRENT THERAPY: observation  INTERVAL HISTORY:  [name] Page, a 75 y.o. 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 [name] Page was last seen on 04/25/2020.  Today he reports feeling well. His energy levels are fair. He denies any CP or lightheadedness.   REVIEW OF SYSTEMS:  Review of Systems  Constitutional: Positive for appetite change (75%) and fatigue (50%).  All other systems reviewed and are negative.   PAST MEDICAL/SURGICAL HISTORY:  Past Medical History:  Diagnosis Date  . Coronary atherosclerosis of native coronary artery    Multivessel, LVEF 59%  . Essential hypertension   . Hemolytic anemia (HCC)   . Hyperlipidemia   . Pernicious anemia    Past Surgical History:  Procedure Laterality Date  . CORONARY ARTERY BYPASS GRAFT  2004   LIMA to LAD, SVG to OM, SVG to PDA    SOCIAL HISTORY:  Social History   Socioeconomic History  . Marital status: Married    Spouse name: Not on file  . Number of children: Not on file  . Years of education: Not on file  . Highest education level: Not on file  Occupational History  . Occupation: Full time  Tobacco Use  . Smoking status: Former Smoker    Types: Cigarettes    Quit date: 02/11/2002    Years since quitting: 18.4  . Smokeless tobacco: Never Used  Substance and Sexual Activity  . Alcohol use: No    Alcohol/week: 0.0 standard drinks  . Drug use: No  . Sexual activity: Not on file  Other Topics Concern  . Not on file  Social History Narrative  . Not on file   Social Determinants of Health   Financial  Resource Strain: Low Risk   . Difficulty of Paying Living Expenses: Not hard at all  Food Insecurity: No Food Insecurity  . Worried About Programme researcher, broadcasting/film/video in the Last Year: Never true  . Ran Out of Food in the Last Year: Never true  Transportation Needs: No Transportation Needs  . Lack of Transportation (Medical): No  . Lack of Transportation (Non-Medical): No  Physical Activity: Inactive  . Days of Exercise per Week: 0 days  . Minutes of Exercise per Session: 0 min  Stress: No Stress Concern Present  . Feeling of Stress : Not at all  Social Connections: Moderately Integrated  . Frequency of Communication with Friends and Family: More than three times a week  . Frequency of Social Gatherings with Friends and Family: Once a week  . Attends Religious Services: More than 4 times per year  . Active Member of Clubs or Organizations: Yes  . Attends Banker Meetings: Never  . Marital Status: Widowed  Intimate Partner Violence: Not At Risk  . Fear of Current or Ex-Partner: No  . Emotionally Abused: No  . Physically Abused: No  . Sexually Abused: No    FAMILY HISTORY:  Family History  Problem Relation Age of Onset  . Dementia Mother   . Cancer Mother        colon  . COPD Father   .  Ulcerative colitis Father   . Heart attack Father     CURRENT MEDICATIONS:  Current Outpatient Medications  Medication Sig Dispense Refill  . APPLE CIDER VINEGAR PO Take 30 mLs by mouth daily.    Marland Kitchen aspirin EC 81 MG tablet Take 81 mg by mouth daily.    . Aspirin-Acetaminophen-Caffeine 166-063-01 MG PACK Take by mouth daily as needed.     Marland Kitchen b complex vitamins tablet Take 1 tablet by mouth daily.    . calcium carbonate (OS-CAL) 600 MG TABS tablet Take 600 mg by mouth 2 (two) times daily with a meal.     . Cranberry 500 MG CAPS Take 2 capsules by mouth daily.     . folic acid (FOLVITE) 1 MG tablet TAKE 1 TABLET BY MOUTH ONCE DAILY. 30 tablet 6  . GARLIC OIL PO Take 1 capsule by mouth  daily.    Marland Kitchen levothyroxine (SYNTHROID, LEVOTHROID) 50 MCG tablet Take 50 mcg by mouth daily.    . Magnesium 250 MG TABS Take 1 tablet by mouth daily.    . metFORMIN (GLUCOPHAGE) 500 MG tablet     . metoprolol succinate (TOPROL-XL) 25 MG 24 hr tablet Take 25 mg by mouth daily.    . Misc Natural Products (PROSTATE CONTROL PO) Take 1 tablet by mouth daily.    . Multiple Vitamins-Minerals (MULTI FOR HIM 50+) TABS Take 1 tablet by mouth daily.     Ailene Ards 3-6-9 CAPS Take 1 capsule by mouth daily.     Letta Pate VERIO test strip     . POTASSIUM PO Take 99 mg by mouth daily.     . psyllium (METAMUCIL) 58.6 % powder Take 1 packet by mouth daily.     . Red Yeast Rice 600 MG TABS Take 1 tablet by mouth daily.     . rosuvastatin (CRESTOR) 5 MG tablet Take by mouth.    . Saw Palmetto 160 MG CAPS Take 1 capsule by mouth daily.     . vitamin B-12 (CYANOCOBALAMIN) 1000 MCG tablet Take 1,000 mcg by mouth daily.    . Zinc Sulfate (ZINC 15 PO) Take by mouth. Patient does not know the correct dose     No current facility-administered medications for this visit.    ALLERGIES:  No Known Allergies  PHYSICAL EXAM:  Performance status (ECOG): 1 - Symptomatic but completely ambulatory  There were no vitals filed for this visit. Wt Readings from Last 3 Encounters:  05/29/20 233 lb (105.7 kg)  04/25/20 239 lb 6.4 oz (108.6 kg)  12/20/19 231 lb 11.3 oz (105.1 kg)   Physical Exam Vitals reviewed.  Constitutional:      Appearance: Normal appearance.  Cardiovascular:     Rate and Rhythm: Normal rate and regular rhythm.     Pulses: Normal pulses.     Heart sounds: Normal heart sounds.  Pulmonary:     Effort: Pulmonary effort is normal.     Breath sounds: Normal breath sounds.  Abdominal:     Palpations: Abdomen is soft. There is no hepatomegaly, splenomegaly or mass.     Tenderness: There is no abdominal tenderness.  Musculoskeletal:     Right lower leg: Edema (trace) present.     Left lower leg:  Edema (trace) present.  Neurological:     General: No focal deficit present.     Mental Status: He is alert and oriented to person, place, and time.  Psychiatric:        Mood and Affect: Mood  normal.        Behavior: Behavior normal.     LABORATORY DATA:  I have reviewed the labs as listed.  CBC Latest Ref Rng & Units 06/28/2020 04/18/2020 12/13/2019  WBC 4.0 - 10.5 K/uL 8.7 6.7 7.6  Hemoglobin 13.0 - 17.0 g/dL 12.1(L) 11.8(L) 13.6  Hematocrit 39.0 - 52.0 % 36.3(L) 35.6(L) 41.8  Platelets 150 - 400 K/uL 219 214 243   CMP Latest Ref Rng & Units 06/28/2020 04/18/2020 12/13/2019  Glucose 70 - 99 mg/dL 562(Z) 308(M) 57(Q)  BUN 8 - 23 mg/dL 19 15 22   Creatinine 0.61 - 1.24 mg/dL 4.69 6.29  Sodium 135 - 145 mmol/L 136 138 138  Potassium 3.5 - 5.1 mmol/L 4.1 4.6 4.5  Chloride 98 - 111 mmol/L 103 104 103  CO2 22 - 32 mmol/L 25 27 28   Calcium 8.9 - 10.3 mg/dL 5.28) 9.0 9.4  Total Protein 6.5 - 8.1 g/dL 6.8 6.7 7.3  Total Bilirubin 0.3 - 1.2 mg/dL ) 2.0(H) 1.5(H)  Alkaline Phos 38 - 126 U/L 75 77 86  AST 15 - 41 U/L 33 39 35  ALT 0 - 44 U/L 30 41 36      Component Value Date/Time   RBC 3.48 (L) 06/28/2020 1040   RBC 3.50 (L) 06/28/2020 1040   MCV 104.3 (H) 06/28/2020 1040   MCH 34.8 (H) 06/28/2020 1040   MCHC 33.3 06/28/2020 1040   RDW 13.4 06/28/2020 1040   LYMPHSABS 0.8 06/28/2020 1040   MONOABS 0.7 06/28/2020 1040   EOSABS 0.3 06/28/2020 1040   BASOSABS 0.1 06/28/2020 1040    DIAGNOSTIC IMAGING:  I have independently reviewed the scans and discussed with the patient. No results found.   ASSESSMENT:  1. Autoimmune hemolytic anemia: -Diagnosed in August 2014, treated with high-dose prednisone. -Rituximab weekly x4 in June 2015. -He is on prednisone 5 mg daily, most recent taper was in May 2019 from 10 mg daily. -He took 5 mg daily for a long time. He is currently taking prednisone 2.5 mg daily.  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 He is on Glucotrol 5  mg daily, Metformin 500 mg daily.   PLAN:  1. Autoimmune hemolytic anemia: -Low-dose prednisone was completely tapered off in November 2021. - He reports appetite at 75% and energy levels are 50%. - Reviewed his labs today which showed total bilirubin improved slightly to 1.9.  LDH also improved to 253.  Hemoglobin is stable around 12.1.  Reticulocyte percentage was 8.3. - Direct Coombs test was positive for IgG and complement.  There is some slight degree of hemolysis. - I will see hemoglobin is staying stable, recommend follow-up in 4 months with repeat labs.  2. Diabetes: -Continue Glucotrol and metformin.  Orders placed this encounter:  No orders of the defined types were placed in this encounter.    June 2019, MD [facility] [phone]   I, [facility], am acting as a scribe for Dr. [phone].  I, Alda Ponder MD, have reviewed the above documentation for accuracy and completeness, and I agree with the above.

## 2020-07-05 ENCOUNTER — Inpatient Hospital Stay (HOSPITAL_COMMUNITY): Payer: Medicare Other

## 2020-07-05 ENCOUNTER — Other Ambulatory Visit (HOSPITAL_COMMUNITY): Payer: Self-pay

## 2020-07-05 ENCOUNTER — Other Ambulatory Visit: Payer: Self-pay

## 2020-07-05 ENCOUNTER — Inpatient Hospital Stay (HOSPITAL_BASED_OUTPATIENT_CLINIC_OR_DEPARTMENT_OTHER): Payer: Medicare Other | Admitting: Hematology

## 2020-07-05 VITALS — BP 133/64 | HR 71 | Temp 96.9°F | Resp 20 | Wt 237.2 lb

## 2020-07-05 DIAGNOSIS — D591 Autoimmune hemolytic anemia, unspecified: Secondary | ICD-10-CM

## 2020-07-05 LAB — DIRECT ANTIGLOBULIN TEST (NOT AT ARMC)
DAT, IgG: POSITIVE
DAT, complement: POSITIVE

## 2020-07-05 LAB — METHYLMALONIC ACID, SERUM: Methylmalonic Acid, Quantitative: 147 nmol/L (ref 0–378)

## 2020-07-05 NOTE — Patient Instructions (Signed)
[facility] at [facility] °Discharge Instructions ° °You were seen today by Dr. Katragadda. He went over your recent results. Dr. Katragadda will see you back in 4 months for labs and follow up. ° ° °Thank you for choosing [facility] at [facility] to provide your oncology and hematology care.  To afford each patient quality time with our provider, please arrive at least 15 minutes before your scheduled appointment time.  ° °If you have a lab appointment with the Cancer Center please come in thru the Main Entrance and check in at the main information desk ° °You need to re-schedule your appointment should you arrive 10 or more minutes late.  We strive to give you quality time with our providers, and arriving late affects you and other patients whose appointments are after yours.  Also, if you no show three or more times for appointments you may be dismissed from the clinic at the providers discretion.     °Again, thank you for choosing [facility].  Our hope is that these requests will decrease the amount of time that you wait before being seen by our physicians.       °_____________________________________________________________ ° °Should you have questions after your visit to [facility], please contact our office at [phone] between the hours of 8:00 a.m. and 4:30 p.m.  Voicemails left after 4:00 p.m. will not be returned until the following business day.  For prescription refill requests, have your pharmacy contact our office and allow 72 hours.   ° °Cancer Center Support Programs:  ° °> Cancer Support Group  °2nd Tuesday of the month 1pm-2pm, Journey Room  ° ° °

## 2020-07-05 NOTE — Progress Notes (Signed)
Direct coombs test order per Dr. Ellin Saba

## 2020-11-14 ENCOUNTER — Inpatient Hospital Stay (HOSPITAL_COMMUNITY): Payer: Medicare Other | Attending: Hematology

## 2020-11-14 DIAGNOSIS — E119 Type 2 diabetes mellitus without complications: Secondary | ICD-10-CM | POA: Insufficient documentation

## 2020-11-14 DIAGNOSIS — Z7984 Long term (current) use of oral hypoglycemic drugs: Secondary | ICD-10-CM | POA: Insufficient documentation

## 2020-11-14 DIAGNOSIS — D591 Autoimmune hemolytic anemia, unspecified: Secondary | ICD-10-CM | POA: Insufficient documentation

## 2020-11-17 ENCOUNTER — Inpatient Hospital Stay (HOSPITAL_COMMUNITY): Payer: Medicare Other

## 2020-11-17 DIAGNOSIS — E119 Type 2 diabetes mellitus without complications: Secondary | ICD-10-CM | POA: Diagnosis not present

## 2020-11-17 DIAGNOSIS — D591 Autoimmune hemolytic anemia, unspecified: Secondary | ICD-10-CM

## 2020-11-17 DIAGNOSIS — Z7984 Long term (current) use of oral hypoglycemic drugs: Secondary | ICD-10-CM | POA: Diagnosis not present

## 2020-11-17 LAB — CBC WITH DIFFERENTIAL/PLATELET
Abs Immature Granulocytes: 0.02 10*3/uL (ref 0.00–0.07)
Basophils Absolute: 0.1 10*3/uL (ref 0.0–0.1)
Basophils Relative: 1 %
Eosinophils Absolute: 0.3 10*3/uL (ref 0.0–0.5)
Eosinophils Relative: 5 %
HCT: 37.6 % — ABNORMAL LOW (ref 39.0–52.0)
Hemoglobin: 12 g/dL — ABNORMAL LOW (ref 13.0–17.0)
Immature Granulocytes: 0 %
Lymphocytes Relative: 13 %
Lymphs Abs: 0.8 10*3/uL (ref 0.7–4.0)
MCH: 34.7 pg — ABNORMAL HIGH (ref 26.0–34.0)
MCHC: 31.9 g/dL (ref 30.0–36.0)
MCV: 108.7 fL — ABNORMAL HIGH (ref 80.0–100.0)
Monocytes Absolute: 0.5 10*3/uL (ref 0.1–1.0)
Monocytes Relative: 8 %
Neutro Abs: 4.4 10*3/uL (ref 1.7–7.7)
Neutrophils Relative %: 73 %
Platelets: 215 10*3/uL (ref 150–400)
RBC: 3.46 MIL/uL — ABNORMAL LOW (ref 4.22–5.81)
RDW: 12.9 % (ref 11.5–15.5)
WBC: 6 10*3/uL (ref 4.0–10.5)
nRBC: 0 % (ref 0.0–0.2)

## 2020-11-17 LAB — COMPREHENSIVE METABOLIC PANEL
ALT: 29 U/L (ref 0–44)
AST: 34 U/L (ref 15–41)
Albumin: 4.1 g/dL (ref 3.5–5.0)
Alkaline Phosphatase: 75 U/L (ref 38–126)
Anion gap: 5 (ref 5–15)
BUN: 18 mg/dL (ref 8–23)
CO2: 28 mmol/L (ref 22–32)
Calcium: 8.7 mg/dL — ABNORMAL LOW (ref 8.9–10.3)
Chloride: 105 mmol/L (ref 98–111)
Creatinine, Ser: 1.11 mg/dL (ref 0.61–1.24)
GFR, Estimated: >60 mL/min (ref >60)
Glucose, Bld: 153 mg/dL — ABNORMAL HIGH (ref 70–99)
Potassium: 3.9 mmol/L (ref 3.5–5.1)
Sodium: 138 mmol/L (ref 135–145)
Total Bilirubin: 2.1 mg/dL — ABNORMAL HIGH (ref 0.3–1.2)
Total Protein: 6.8 g/dL (ref 6.5–8.1)

## 2020-11-17 LAB — LACTATE DEHYDROGENASE: LDH: 270 U/L — ABNORMAL HIGH (ref 98–192)

## 2020-11-17 LAB — RETICULOCYTES
Immature Retic Fract: 18.2 % — ABNORMAL HIGH (ref 2.3–15.9)
RBC.: 3.47 MIL/uL — ABNORMAL LOW (ref 4.22–5.81)
Retic Count, Absolute: 236.3 10*3/uL — ABNORMAL HIGH (ref 19.0–186.0)
Retic Ct Pct: 6.8 % — ABNORMAL HIGH (ref 0.4–3.1)

## 2020-11-17 LAB — MAGNESIUM: Magnesium: 2 mg/dL (ref 1.7–2.4)

## 2020-11-17 LAB — VITAMIN B12: Vitamin B-12: 3185 pg/mL — ABNORMAL HIGH (ref 180–914)
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
## 2020-11-21 ENCOUNTER — Inpatient Hospital Stay (HOSPITAL_BASED_OUTPATIENT_CLINIC_OR_DEPARTMENT_OTHER): Payer: Medicare Other | Admitting: Hematology

## 2020-11-21 ENCOUNTER — Other Ambulatory Visit: Payer: Self-pay

## 2020-11-21 VITALS — BP 149/71 | HR 66 | Temp 96.6°F | Resp 18 | Wt 230.5 lb

## 2020-11-21 DIAGNOSIS — D591 Autoimmune hemolytic anemia, unspecified: Secondary | ICD-10-CM

## 2020-11-21 NOTE — Patient Instructions (Signed)
[facility] Discharge Instructions  You were seen and examined today by Dr. Katragadda. Please follow up as scheduled.    Thank you for choosing [facility] at [facility] to provide your oncology and hematology care.  To afford each patient quality time with our provider, please arrive at least 15 minutes before your scheduled appointment time.   If you have a lab appointment with the Cancer Center please come in thru the Main Entrance and check in at the main information desk.  You need to re-schedule your appointment should you arrive 10 or more minutes late.  We strive to give you quality time with our providers, and arriving late affects you and other patients whose appointments are after yours.  Also, if you no show three or more times for appointments you may be dismissed from the clinic at the providers discretion.     Again, thank you for choosing [facility].  Our hope is that these requests will decrease the amount of time that you wait before being seen by our physicians.       _____________________________________________________________  Should you have questions after your visit to [facility], please contact our office at [phone] and follow the prompts.  Our office hours are 8:00 a.m. and 4:30 p.m. Monday - Friday.  Please note that voicemails left after 4:00 p.m. may not be returned until the following business day.  We are closed weekends and major holidays.  You do have access to a nurse 24-7, just call the main number to the clinic [phone] and do not press any options, hold on the line and a nurse will answer the phone.    For prescription refill requests, have your pharmacy contact our office and allow 72 hours.    Due to Covid, you will need to wear a mask upon entering the hospital. If you do not have a mask, a mask will be given to you at the Main Entrance upon arrival. For  doctor visits, patients may have 1 support person age 18 or older with them. For treatment visits, patients can not have anyone with them due to social distancing guidelines and our immunocompromised population.      

## 2020-12-14 ENCOUNTER — Encounter: Payer: Self-pay | Admitting: Cardiology

## 2021-01-01 ENCOUNTER — Other Ambulatory Visit (HOSPITAL_COMMUNITY): Payer: Self-pay | Admitting: Hematology

## 2021-01-01 DIAGNOSIS — D591 Autoimmune hemolytic anemia, unspecified: Secondary | ICD-10-CM

## 2021-01-30 ENCOUNTER — Other Ambulatory Visit (HOSPITAL_COMMUNITY): Payer: Self-pay | Admitting: Physician Assistant

## 2021-01-30 DIAGNOSIS — D591 Autoimmune hemolytic anemia, unspecified: Secondary | ICD-10-CM

## 2021-01-31 ENCOUNTER — Ambulatory Visit (INDEPENDENT_AMBULATORY_CARE_PROVIDER_SITE_OTHER): Payer: Medicare Other | Admitting: Cardiology

## 2021-01-31 ENCOUNTER — Encounter: Payer: Self-pay | Admitting: Cardiology

## 2021-01-31 ENCOUNTER — Encounter: Payer: Self-pay | Admitting: *Deleted

## 2021-01-31 VITALS — BP 158/70 | HR 83 | Ht 73.0 in | Wt 232.4 lb

## 2021-01-31 DIAGNOSIS — E782 Mixed hyperlipidemia: Secondary | ICD-10-CM | POA: Diagnosis not present

## 2021-01-31 DIAGNOSIS — I25119 Atherosclerotic heart disease of native coronary artery with unspecified angina pectoris: Secondary | ICD-10-CM | POA: Diagnosis not present

## 2021-01-31 NOTE — Progress Notes (Signed)
Cardiology Office Note  Date: 01/31/2021   ID: [name] Page, [name] Page 21-Jul-1945, MRN [id number]  PCP:  [name], MD  Cardiologist:  Nona Dell, MD Electrophysiologist:  None   Chief Complaint  Patient presents with   Cardiac follow-up    History of Present Illness: [name] Page is a 74 y.o. male last seen in April by Mr. Vincenza Hews NP.  He is here for a follow-up visit.  He does not report any active angina symptoms at this time.  He continues to follow regularly with hematology for follow-up of hemolytic anemia.  I reviewed his lab work from October.  Also requesting interval lab work from Dr. Sherril Croon.  I reviewed his cardiac medications which are noted below.  He reports compliance with therapy.  Blood pressure is elevated today over prior baseline.  Ischemic testing from 2020 was low risk with evidence of previous infarct scar but no active ischemia and normal LVEF.  Past Medical History:  Diagnosis Date   Coronary atherosclerosis of native coronary artery    Multivessel, LVEF 59%   Essential hypertension    Hemolytic anemia (HCC)    Hyperlipidemia    Pernicious anemia     Past Surgical History:  Procedure Laterality Date   CORONARY ARTERY BYPASS GRAFT  2004   LIMA to LAD, SVG to OM, SVG to PDA    Current Outpatient Medications  Medication Sig Dispense Refill   APPLE CIDER VINEGAR PO Take 30 mLs by mouth daily.     aspirin EC 81 MG tablet Take 81 mg by mouth daily.     Aspirin-Acetaminophen-Caffeine 500-325-65 MG PACK Take by mouth daily as needed.     b complex vitamins tablet Take 1 tablet by mouth daily.     calcium carbonate (OS-CAL) 600 MG TABS tablet Take 600 mg by mouth 2 (two) times daily with a meal.      Cranberry 500 MG CAPS Take 2 capsules by mouth daily.      folic acid (FOLVITE) 1 MG tablet TAKE 1 TABLET BY MOUTH ONCE DAILY. 30 tablet 0   GARLIC OIL PO Take 1 capsule by mouth daily.     levothyroxine (SYNTHROID, LEVOTHROID) 50 MCG tablet  Take 50 mcg by mouth daily.     Magnesium 250 MG TABS Take 1 tablet by mouth daily.     metFORMIN (GLUCOPHAGE) 500 MG tablet      metoprolol succinate (TOPROL-XL) 25 MG 24 hr tablet Take 25 mg by mouth daily.     Misc Natural Products (PROSTATE CONTROL PO) Take 1 tablet by mouth daily.     Multiple Vitamins-Minerals (MULTI FOR HIM 50+) TABS Take 1 tablet by mouth daily.      Omega 3-6-9 CAPS Take 1 capsule by mouth daily.      ONETOUCH VERIO test strip      pioglitazone (ACTOS) 15 MG tablet Take 15 mg by mouth daily.     POTASSIUM PO Take 99 mg by mouth daily.      psyllium (METAMUCIL) 58.6 % powder Take 1 packet by mouth daily.      Red Yeast Rice 600 MG TABS Take 1 tablet by mouth daily.      rosuvastatin (CRESTOR) 5 MG tablet Take by mouth.     Saw Palmetto 160 MG CAPS Take 1 capsule by mouth daily.      vitamin B-12 (CYANOCOBALAMIN) 1000 MCG tablet Take 1,000 mcg by mouth daily.     Zinc Sulfate (ZINC 15  PO) Take by mouth. Patient does not know the correct dose     No current facility-administered medications for this visit.   Allergies:  Patient has no known allergies.   ROS: No orthopnea or PND.  Physical Exam: VS:  BP (!) 158/70    Pulse 83    Ht 6\' 1"  (1.854 m)    Wt 232 lb 6.4 oz (105.4 kg)    SpO2 99%    BMI 30.66 kg/m , BMI Body mass index is 30.66 kg/m.  Wt Readings from Last 3 Encounters:  01/31/21 232 lb 6.4 oz (105.4 kg)  11/21/20 230 lb 8 oz (104.6 kg)  07/05/20 237 lb 3.2 oz (107.6 kg)    General: Patient appears comfortable at rest. HEENT: Conjunctiva and lids normal, wearing a mask. Neck: Supple, no elevated JVP or carotid bruits, no thyromegaly. Lungs: Clear to auscultation, nonlabored breathing at rest. Cardiac: Regular rate and rhythm, no S3 or significant systolic murmur. Extremities: No pitting edema.  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  Recent  Labwork: 11/17/2020: ALT 29; AST 34; BUN 18; Creatinine, Ser 1.11; Hemoglobin 12.0; Magnesium 2.0; Platelets 215; Potassium 3.9; Sodium 138   Other Studies Reviewed Today:  Lexiscan Myoview 08/10/2018: No diagnostic ST segment changes to indicate ischemia. Occasional PACs and PVCs were noted without sustained arrhythmia. Moderate sized, severe intensity, fixed inferior defect also involving the inferior septal wall at the base most consistent with infarct scar. There is no active ischemia. This is a low risk study. Nuclear stress EF: 58%.  Assessment and Plan:  1.  Multivessel CAD status post CABG in 2004.  He reports no active angina at this time and Myoview from 2020 was low risk.  Continue aspirin, Toprol-XL, and Crestor.  Requesting interval lab work from Dr. 2021.  2.  Type 2 diabetes mellitus, previously on prednisone for treatment of hemolytic anemia which has been discontinued.  He continues on Glucophage and Actos with follow-up by Dr. Sherril Croon.  Depending on his glucose control could consider simplifying regimen to include SGLT2 inhibitor such as Jardiance for additional cardiovascular risk reduction.  3.  History of hemolytic anemia followed by Dr. Sherril Croon.  Hemoglobin was 12.0 in October.  Medication Adjustments/Labs and Tests Ordered: Current medicines are reviewed at length with the patient today.  Concerns regarding medicines are outlined above.   Tests Ordered: No orders of the defined types were placed in this encounter.   Medication Changes: No orders of the defined types were placed in this encounter.   Disposition:  Follow up  6 months.  Signed, November, MD, [facility] 01/31/2021 4:28 PM    [facility] at [facility] [address] [place], [place], [place] Phone: [phone]; Fax: [phone]

## 2021-01-31 NOTE — Patient Instructions (Addendum)

## 2021-03-05 ENCOUNTER — Other Ambulatory Visit (HOSPITAL_COMMUNITY): Payer: Self-pay | Admitting: Physician Assistant

## 2021-03-05 DIAGNOSIS — D591 Autoimmune hemolytic anemia, unspecified: Secondary | ICD-10-CM

## 2021-03-20 ENCOUNTER — Other Ambulatory Visit: Payer: Self-pay

## 2021-03-20 ENCOUNTER — Inpatient Hospital Stay (HOSPITAL_COMMUNITY): Payer: Medicare Other | Attending: Hematology

## 2021-03-20 DIAGNOSIS — Z87891 Personal history of nicotine dependence: Secondary | ICD-10-CM | POA: Insufficient documentation

## 2021-03-20 DIAGNOSIS — Z7984 Long term (current) use of oral hypoglycemic drugs: Secondary | ICD-10-CM | POA: Diagnosis not present

## 2021-03-20 DIAGNOSIS — D591 Autoimmune hemolytic anemia, unspecified: Secondary | ICD-10-CM | POA: Insufficient documentation

## 2021-03-20 DIAGNOSIS — Z79899 Other long term (current) drug therapy: Secondary | ICD-10-CM | POA: Insufficient documentation

## 2021-03-20 DIAGNOSIS — Z7982 Long term (current) use of aspirin: Secondary | ICD-10-CM | POA: Insufficient documentation

## 2021-03-20 DIAGNOSIS — E119 Type 2 diabetes mellitus without complications: Secondary | ICD-10-CM | POA: Diagnosis not present

## 2021-03-20 LAB — CBC WITH DIFFERENTIAL/PLATELET
Abs Immature Granulocytes: 0.02 10*3/uL (ref 0.00–0.07)
Basophils Absolute: 0.1 10*3/uL (ref 0.0–0.1)
Basophils Relative: 1 %
Eosinophils Absolute: 0.4 10*3/uL (ref 0.0–0.5)
Eosinophils Relative: 8 %
HCT: 33.1 % — ABNORMAL LOW (ref 39.0–52.0)
Hemoglobin: 12.2 g/dL — ABNORMAL LOW (ref 13.0–17.0)
Immature Granulocytes: 0 %
Lymphocytes Relative: 15 %
Lymphs Abs: 0.8 10*3/uL (ref 0.7–4.0)
MCH: 40.5 pg — ABNORMAL HIGH (ref 26.0–34.0)
MCHC: 36.9 g/dL — ABNORMAL HIGH (ref 30.0–36.0)
MCV: 110 fL — ABNORMAL HIGH (ref 80.0–100.0)
Monocytes Absolute: 0.5 10*3/uL (ref 0.1–1.0)
Monocytes Relative: 9 %
Neutro Abs: 3.6 10*3/uL (ref 1.7–7.7)
Neutrophils Relative %: 67 %
Platelets: 184 10*3/uL (ref 150–400)
RBC: 3.01 MIL/uL — ABNORMAL LOW (ref 4.22–5.81)
RDW: 17.3 % — ABNORMAL HIGH (ref 11.5–15.5)
WBC: 5.3 10*3/uL (ref 4.0–10.5)
nRBC: 0 % (ref 0.0–0.2)

## 2021-03-20 LAB — HEPATIC FUNCTION PANEL
ALT: 29 U/L (ref 0–44)
AST: 33 U/L (ref 15–41)
Albumin: 3.9 g/dL (ref 3.5–5.0)
Alkaline Phosphatase: 74 U/L (ref 38–126)
Bilirubin, Direct: 0.3 mg/dL — ABNORMAL HIGH (ref 0.0–0.2)
Indirect Bilirubin: 1.4 mg/dL — ABNORMAL HIGH (ref 0.3–0.9)
Total Bilirubin: 1.7 mg/dL — ABNORMAL HIGH (ref 0.3–1.2)
Total Protein: 6.9 g/dL (ref 6.5–8.1)

## 2021-03-20 LAB — RETICULOCYTES
Immature Retic Fract: 21.7 % — ABNORMAL HIGH (ref 2.3–15.9)
RBC.: 2.8 MIL/uL — ABNORMAL LOW (ref 4.22–5.81)
Retic Count, Absolute: 175 10*3/uL (ref 19.0–186.0)
Retic Ct Pct: 6.3 % — ABNORMAL HIGH (ref 0.4–3.1)

## 2021-03-20 LAB — LACTATE DEHYDROGENASE: LDH: 271 U/L — ABNORMAL HIGH (ref 98–192)

## 2021-03-27 ENCOUNTER — Inpatient Hospital Stay (HOSPITAL_BASED_OUTPATIENT_CLINIC_OR_DEPARTMENT_OTHER): Payer: Medicare Other | Admitting: Hematology

## 2021-03-27 ENCOUNTER — Other Ambulatory Visit: Payer: Self-pay

## 2021-03-27 VITALS — BP 157/69 | HR 72 | Temp 97.9°F | Resp 19 | Ht 73.0 in | Wt 227.7 lb

## 2021-03-27 DIAGNOSIS — D591 Autoimmune hemolytic anemia, unspecified: Secondary | ICD-10-CM

## 2021-03-27 NOTE — Progress Notes (Signed)
[place],  [address]   CLINIC:  Medical Oncology/Hematology  PCP:  Glenda Chroman, MD [address] / [place] [address]  [phone]  REASON FOR VISIT:  Follow-up for autoimmune hemolytic anemia  PRIOR THERAPY:  1. High dose prednisone in 09/2012. 2. Rituximab weekly x 4 in 07/2013. 3. Prednisone taper through 12/2019.  CURRENT THERAPY: surveillance  INTERVAL HISTORY:  [name], a 76 y.o. 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 [name] Page was last seen on 11/21/2020.  Today he reports feeling good. His energy is at baseline. He denies recent steroid use. He continues to take folic acid.   REVIEW OF SYSTEMS:  Review of Systems  Constitutional:  Negative for appetite change and fatigue.  All other systems reviewed and are negative.  PAST MEDICAL/SURGICAL HISTORY:  Past Medical History:  Diagnosis Date   Coronary atherosclerosis of native coronary artery    Multivessel, LVEF 59%   Essential hypertension    Hemolytic anemia (HCC)    Hyperlipidemia    Pernicious anemia    Past Surgical History:  Procedure Laterality Date   CORONARY ARTERY BYPASS GRAFT  2004   LIMA to LAD, SVG to OM, SVG to PDA    SOCIAL HISTORY:  Social History   Socioeconomic History   Marital status: Widowed    Spouse name: Not on file   Number of children: Not on file   Years of education: Not on file   Highest education level: Not on file  Occupational History   Occupation: Full time  Tobacco Use   Smoking status: Former    Types: Cigarettes    Quit date: 02/11/2002    Years since quitting: 19.1   Smokeless tobacco: Never  Substance and Sexual Activity   Alcohol use: No    Alcohol/week: 0.0 standard drinks   Drug use: No   Sexual activity: Not on file  Other Topics Concern   Not on file  Social History Narrative   Not on file   Social Determinants of Health   Financial Resource Strain: Not on  file  Food Insecurity: Not on file  Transportation Needs: Not on file  Physical Activity: Not on file  Stress: Not on file  Social Connections: Not on file  Intimate Partner Violence: Not on file    FAMILY HISTORY:  Family History  Problem Relation Age of Onset   Dementia Mother    Cancer Mother        colon   COPD Father    Ulcerative colitis Father    Heart attack Father     CURRENT MEDICATIONS:  Current Outpatient Medications  Medication Sig Dispense Refill   APPLE CIDER VINEGAR PO Take 30 mLs by mouth daily.     aspirin EC 81 MG tablet Take 81 mg by mouth daily.     Aspirin-Acetaminophen-Caffeine 500-325-65 MG PACK Take by mouth daily as needed.     b complex vitamins tablet Take 1 tablet by mouth daily.     calcium carbonate (OS-CAL) 600 MG TABS tablet Take 600 mg by mouth 2 (two) times daily with a meal.      Cranberry 500 MG CAPS Take 2 capsules by mouth daily.      folic acid (FOLVITE) 1 MG tablet TAKE 1 TABLET BY MOUTH ONCE DAILY. 30 tablet 0   GARLIC OIL PO Take 1 capsule by mouth daily.     levothyroxine (SYNTHROID, LEVOTHROID) 50  MCG tablet Take 50 mcg by mouth daily.     Magnesium 250 MG TABS Take 1 tablet by mouth daily.     metFORMIN (GLUCOPHAGE) 500 MG tablet      metoprolol succinate (TOPROL-XL) 25 MG 24 hr tablet Take 25 mg by mouth daily.     Misc Natural Products (PROSTATE CONTROL PO) Take 1 tablet by mouth daily.     Multiple Vitamins-Minerals (MULTI FOR HIM 50+) TABS Take 1 tablet by mouth daily.      Omega 3-6-9 CAPS Take 1 capsule by mouth daily.      ONETOUCH VERIO test strip      pioglitazone (ACTOS) 15 MG tablet Take 15 mg by mouth daily.     POTASSIUM PO Take 99 mg by mouth daily.      psyllium (METAMUCIL) 58.6 % powder Take 1 packet by mouth daily.      Red Yeast Rice 600 MG TABS Take 1 tablet by mouth daily.      rosuvastatin (CRESTOR) 5 MG tablet Take by mouth.     Saw Palmetto 160 MG CAPS Take 1 capsule by mouth daily.      vitamin B-12  (CYANOCOBALAMIN) 1000 MCG tablet Take 1,000 mcg by mouth daily.     Zinc Sulfate (ZINC 15 PO) Take by mouth. Patient does not know the correct dose     No current facility-administered medications for this visit.    ALLERGIES:  No Known Allergies  PHYSICAL EXAM:  Performance status (ECOG): 1 - Symptomatic but completely ambulatory  Vitals:   03/27/21 1448  BP: (!) 157/69  Pulse: 72  Resp: 19  Temp: 97.9 F (36.6 C)  SpO2: 99%   Wt Readings from Last 3 Encounters:  03/27/21 227 lb 11.2 oz (103.3 kg)  01/31/21 232 lb 6.4 oz (105.4 kg)  11/21/20 230 lb 8 oz (104.6 kg)   Physical Exam Vitals reviewed.  Constitutional:      Appearance: Normal appearance.  Cardiovascular:     Rate and Rhythm: Normal rate and regular rhythm.     Pulses: Normal pulses.     Heart sounds: Normal heart sounds.  Pulmonary:     Effort: Pulmonary effort is normal.     Breath sounds: Normal breath sounds.  Neurological:     General: No focal deficit present.     Mental Status: He is alert and oriented to person, place, and time.  Psychiatric:        Mood and Affect: Mood normal.        Behavior: Behavior normal.    LABORATORY DATA:  I have reviewed the labs as listed.  CBC Latest Ref Rng & Units 03/20/2021 11/17/2020 06/28/2020  WBC 4.0 - 10.5 K/uL 5.3 6.0 8.7  Hemoglobin 13.0 - 17.0 g/dL 12.2(L) 12.0(L) 12.1(L)  Hematocrit 39.0 - 52.0 % 33.1(L) 37.6(L) 36.3(L)  Platelets 150 - 400 K/uL 184 215 219   CMP Latest Ref Rng & Units 03/20/2021 11/17/2020 06/28/2020  Glucose 70 - 99 mg/dL - 153(H) 191(H)  BUN 8 - 23 mg/dL - 18 19  Creatinine 0.61 - 1.24 mg/dL - 1.11 1.18  Sodium 135 - 145 mmol/L - 138 136  Potassium 3.5 - 5.1 mmol/L - 3.9 4.1  Chloride 98 - 111 mmol/L - 105 103  CO2 22 - 32 mmol/L - 28 25  Calcium 8.9 - 10.3 mg/dL - 8.7(L) 8.7(L)  Total Protein 6.5 - 8.1 g/dL 6.9 6.8 6.8  Total Bilirubin 0.3 - 1.2 mg/dL 1.7(H) 2.1(H) 1.9(H)  Alkaline Phos 38 - 126 U/L 74 75 75  AST 15 - 41 U/L 33  34 33  ALT 0 - 44 U/L 29 29 30       Component Value Date/Time   RBC 2.80 (L) 03/20/2021 1019   RBC 3.01 (L) 03/20/2021 1018   MCV 110.0 (H) 03/20/2021 1018   MCH 40.5 (H) 03/20/2021 1018   MCHC 36.9 (H) 03/20/2021 1018   RDW 17.3 (H) 03/20/2021 1018   LYMPHSABS 0.8 03/20/2021 1018   MONOABS 0.5 03/20/2021 1018   EOSABS 0.4 03/20/2021 1018   BASOSABS 0.1 03/20/2021 1018    DIAGNOSTIC IMAGING:  I have independently reviewed the scans and discussed with the patient. No results found.   ASSESSMENT:  1.  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 - Rituximab weekly x4 in June 2015. - He is on prednisone 5 mg daily, most recent taper was in May 2019 from 10 mg daily. -He took 5 mg daily for a long time.  Prednisone was completely tapered off in November 2021.   2.  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  He is on metformin and Actos.   PLAN:  1.  Autoimmune hemolytic anemia: - He reports that his energy levels are stable around 75%. - We have reviewed his labs from 03/20/2021.  Hemoglobin is 12.2, reticulocyte count was 6.3%.  Total bilirubin 1.7, indirect bilirubin 1.4 and elevated LDH 271 with normal LFTs. - He has some background hemolysis which is stable.  His hemoglobin has been staying between 12-13 for the last 1 year.  It was about 13 prior to discontinuing prednisone. - Recommend follow-up in 6 months with repeat labs.  Orders placed this encounter:  No orders of the defined types were placed in this encounter.    Derek Jack, MD [place] [phone]   I, Thana Ates, am acting as a scribe for Dr. Derek Jack.  I, Derek Jack MD, have reviewed the above documentation for accuracy and completeness, and I agree with the above.

## 2021-03-27 NOTE — Patient Instructions (Signed)
[facility] °Discharge Instructions ° °You were seen and examined today by Dr. Katragadda. He reviewed your most recent labs and everything looks good. Please keep follow up appointment as scheduled in 6 months.  ° ° °Thank you for choosing [facility] at [facility] to provide your oncology and hematology care.  To afford each patient quality time with our provider, please arrive at least 15 minutes before your scheduled appointment time.  ° °If you have a lab appointment with the Cancer Center please come in thru the Main Entrance and check in at the main information desk. ° °You need to re-schedule your appointment should you arrive 10 or more minutes late.  We strive to give you quality time with our providers, and arriving late affects you and other patients whose appointments are after yours.  Also, if you no show three or more times for appointments you may be dismissed from the clinic at the providers discretion.     °Again, thank you for choosing [facility].  Our hope is that these requests will decrease the amount of time that you wait before being seen by our physicians.       °_____________________________________________________________ ° °Should you have questions after your visit to [facility], please contact our office at [phone] and follow the prompts.  Our office hours are 8:00 a.m. and 4:30 p.m. Monday - Friday.  Please note that voicemails left after 4:00 p.m. may not be returned until the following business day.  We are closed weekends and major holidays.  You do have access to a nurse 24-7, just call the main number to the clinic [phone] and do not press any options, hold on the line and a nurse will answer the phone.   ° °For prescription refill requests, have your pharmacy contact our office and allow 72 hours.   ° °Due to Covid, you will need to wear a mask upon entering the hospital. If you do  not have a mask, a mask will be given to you at the Main Entrance upon arrival. For doctor visits, patients may have 1 support person age 18 or older with them. For treatment visits, patients can not have anyone with them due to social distancing guidelines and our immunocompromised population.  ° °  °

## 2021-04-03 ENCOUNTER — Other Ambulatory Visit (HOSPITAL_COMMUNITY): Payer: Self-pay | Admitting: Physician Assistant

## 2021-04-03 DIAGNOSIS — D591 Autoimmune hemolytic anemia, unspecified: Secondary | ICD-10-CM

## 2021-05-03 ENCOUNTER — Other Ambulatory Visit (HOSPITAL_COMMUNITY): Payer: Self-pay | Admitting: Physician Assistant

## 2021-05-03 DIAGNOSIS — D591 Autoimmune hemolytic anemia, unspecified: Secondary | ICD-10-CM

## 2021-06-04 ENCOUNTER — Other Ambulatory Visit (HOSPITAL_COMMUNITY): Payer: Self-pay | Admitting: Physician Assistant

## 2021-06-04 DIAGNOSIS — D591 Autoimmune hemolytic anemia, unspecified: Secondary | ICD-10-CM

## 2021-07-04 ENCOUNTER — Other Ambulatory Visit (HOSPITAL_COMMUNITY): Payer: Self-pay | Admitting: Physician Assistant

## 2021-07-04 DIAGNOSIS — D591 Autoimmune hemolytic anemia, unspecified: Secondary | ICD-10-CM

## 2021-07-17 ENCOUNTER — Other Ambulatory Visit (HOSPITAL_COMMUNITY): Payer: Medicare Other

## 2021-07-24 ENCOUNTER — Ambulatory Visit (HOSPITAL_COMMUNITY): Payer: Medicare Other | Admitting: Hematology

## 2021-08-05 ENCOUNTER — Other Ambulatory Visit (HOSPITAL_COMMUNITY): Payer: Self-pay | Admitting: Physician Assistant

## 2021-08-05 DIAGNOSIS — D591 Autoimmune hemolytic anemia, unspecified: Secondary | ICD-10-CM

## 2021-08-31 ENCOUNTER — Other Ambulatory Visit (HOSPITAL_COMMUNITY): Payer: Self-pay | Admitting: Physician Assistant

## 2021-08-31 DIAGNOSIS — D591 Autoimmune hemolytic anemia, unspecified: Secondary | ICD-10-CM

## 2021-09-24 ENCOUNTER — Inpatient Hospital Stay: Payer: Medicare Other | Attending: Hematology

## 2021-09-24 DIAGNOSIS — Z7984 Long term (current) use of oral hypoglycemic drugs: Secondary | ICD-10-CM | POA: Insufficient documentation

## 2021-09-24 DIAGNOSIS — D591 Autoimmune hemolytic anemia, unspecified: Secondary | ICD-10-CM | POA: Insufficient documentation

## 2021-09-24 DIAGNOSIS — R200 Anesthesia of skin: Secondary | ICD-10-CM | POA: Insufficient documentation

## 2021-09-24 DIAGNOSIS — Z951 Presence of aortocoronary bypass graft: Secondary | ICD-10-CM | POA: Insufficient documentation

## 2021-09-24 DIAGNOSIS — E0965 Drug or chemical induced diabetes mellitus with hyperglycemia: Secondary | ICD-10-CM | POA: Insufficient documentation

## 2021-09-24 DIAGNOSIS — R202 Paresthesia of skin: Secondary | ICD-10-CM | POA: Insufficient documentation

## 2021-09-24 DIAGNOSIS — T380X5A Adverse effect of glucocorticoids and synthetic analogues, initial encounter: Secondary | ICD-10-CM | POA: Insufficient documentation

## 2021-09-24 DIAGNOSIS — Z87891 Personal history of nicotine dependence: Secondary | ICD-10-CM | POA: Insufficient documentation

## 2021-10-01 ENCOUNTER — Inpatient Hospital Stay (HOSPITAL_BASED_OUTPATIENT_CLINIC_OR_DEPARTMENT_OTHER): Payer: Medicare Other | Admitting: Hematology

## 2021-10-01 ENCOUNTER — Inpatient Hospital Stay: Payer: Medicare Other

## 2021-10-01 VITALS — BP 161/83 | HR 71 | Temp 97.2°F | Resp 18 | Ht 73.0 in | Wt 229.0 lb

## 2021-10-01 DIAGNOSIS — D591 Autoimmune hemolytic anemia, unspecified: Secondary | ICD-10-CM

## 2021-10-01 DIAGNOSIS — E0965 Drug or chemical induced diabetes mellitus with hyperglycemia: Secondary | ICD-10-CM | POA: Diagnosis not present

## 2021-10-01 DIAGNOSIS — Z951 Presence of aortocoronary bypass graft: Secondary | ICD-10-CM | POA: Diagnosis not present

## 2021-10-01 DIAGNOSIS — Z87891 Personal history of nicotine dependence: Secondary | ICD-10-CM | POA: Diagnosis not present

## 2021-10-01 DIAGNOSIS — Z7984 Long term (current) use of oral hypoglycemic drugs: Secondary | ICD-10-CM | POA: Diagnosis not present

## 2021-10-01 DIAGNOSIS — R200 Anesthesia of skin: Secondary | ICD-10-CM | POA: Diagnosis not present

## 2021-10-01 DIAGNOSIS — T380X5A Adverse effect of glucocorticoids and synthetic analogues, initial encounter: Secondary | ICD-10-CM | POA: Diagnosis not present

## 2021-10-01 DIAGNOSIS — R202 Paresthesia of skin: Secondary | ICD-10-CM | POA: Diagnosis not present

## 2021-10-01 LAB — CBC WITH DIFFERENTIAL/PLATELET
Abs Immature Granulocytes: 0.01 10*3/uL (ref 0.00–0.07)
Basophils Absolute: 0.1 10*3/uL (ref 0.0–0.1)
Basophils Relative: 1 %
Eosinophils Absolute: 0.3 10*3/uL (ref 0.0–0.5)
Eosinophils Relative: 5 %
HCT: 37.4 % — ABNORMAL LOW (ref 39.0–52.0)
Hemoglobin: 12.6 g/dL — ABNORMAL LOW (ref 13.0–17.0)
Immature Granulocytes: 0 %
Lymphocytes Relative: 17 %
Lymphs Abs: 0.9 10*3/uL (ref 0.7–4.0)
MCH: 33.3 pg (ref 26.0–34.0)
MCHC: 33.7 g/dL (ref 30.0–36.0)
MCV: 98.9 fL (ref 80.0–100.0)
Monocytes Absolute: 0.5 10*3/uL (ref 0.1–1.0)
Monocytes Relative: 8 %
Neutro Abs: 3.7 10*3/uL (ref 1.7–7.7)
Neutrophils Relative %: 69 %
Platelets: 191 10*3/uL (ref 150–400)
RBC: 3.78 MIL/uL — ABNORMAL LOW (ref 4.22–5.81)
RDW: 13.4 % (ref 11.5–15.5)
WBC: 5.3 10*3/uL (ref 4.0–10.5)
nRBC: 0 % (ref 0.0–0.2)

## 2021-10-01 LAB — HEPATIC FUNCTION PANEL
ALT: 24 U/L (ref 0–44)
AST: 31 U/L (ref 15–41)
Albumin: 3.8 g/dL (ref 3.5–5.0)
Alkaline Phosphatase: 67 U/L (ref 38–126)
Bilirubin, Direct: 0.3 mg/dL — ABNORMAL HIGH (ref 0.0–0.2)
Indirect Bilirubin: 1.4 mg/dL — ABNORMAL HIGH (ref 0.3–0.9)
Total Bilirubin: 1.7 mg/dL — ABNORMAL HIGH (ref 0.3–1.2)
Total Protein: 6.9 g/dL (ref 6.5–8.1)

## 2021-10-01 LAB — RETICULOCYTES
Immature Retic Fract: 20.1 % — ABNORMAL HIGH (ref 2.3–15.9)
RBC.: 3.72 MIL/uL — ABNORMAL LOW (ref 4.22–5.81)
Retic Count, Absolute: 197.2 10*3/uL — ABNORMAL HIGH (ref 19.0–186.0)
Retic Ct Pct: 5.3 % — ABNORMAL HIGH (ref 0.4–3.1)

## 2021-10-01 LAB — LACTATE DEHYDROGENASE: LDH: 264 U/L — ABNORMAL HIGH (ref 98–192)

## 2021-10-01 NOTE — Progress Notes (Signed)
[facility] [address], [place] [address]   CLINIC:  Medical Oncology/Hematology  PCP:  [name], MD [address] / [place] [address]  [phone]  REASON FOR VISIT:  Follow-up for autoimmune hemolytic anemia  PRIOR THERAPY:  1. High dose prednisone in 09/2012. 2. Rituximab weekly x 4 in 07/2013. 3. Prednisone taper through 12/2019.  CURRENT THERAPY: surveillance  INTERVAL HISTORY:  [name], a 76 y.o. male, returns to clinic for follow-up of autoimmune hemolytic anemia.  Appetite is 100% and energy levels are 75%.  Numbness and tingling in the hands and feet has been stable.  No infections or ER visits or hospitalizations from the last 6 months.  REVIEW OF SYSTEMS:  Review of Systems  Constitutional:  Negative for appetite change and fatigue.  Neurological:  Positive for numbness.  All other systems reviewed and are negative.   PAST MEDICAL/SURGICAL HISTORY:  Past Medical History:  Diagnosis Date   Coronary atherosclerosis of native coronary artery    Multivessel, LVEF 59%   Essential hypertension    Hemolytic anemia (HCC)    Hyperlipidemia    Pernicious anemia    Past Surgical History:  Procedure Laterality Date   CORONARY ARTERY BYPASS GRAFT  2004   LIMA to LAD, SVG to OM, SVG to PDA    SOCIAL HISTORY:  Social History   Socioeconomic History   Marital status: Widowed    Spouse name: Not on file   Number of children: Not on file   Years of education: Not on file   Highest education level: Not on file  Occupational History   Occupation: Full time  Tobacco Use   Smoking status: Former    Types: Cigarettes    Quit date: 02/11/2002    Years since quitting: 19.6   Smokeless tobacco: Never  Substance and Sexual Activity   Alcohol use: No    Alcohol/week: 0.0 standard drinks of alcohol   Drug use: No   Sexual activity: Not on file  Other Topics Concern   Not on file  Social History Narrative   Not on file    Social Determinants of Health   Financial Resource Strain: Low Risk  (12/20/2019)   Overall Financial Resource Strain (CARDIA)    Difficulty of Paying Living Expenses: Not hard at all  Food Insecurity: No Food Insecurity (12/20/2019)   Hunger Vital Sign    Worried About Running Out of Food in the Last Year: Never true    Ran Out of Food in the Last Year: Never true  Transportation Needs: No Transportation Needs (12/20/2019)   PRAPARE - Administrator, Civil Service (Medical): No    Lack of Transportation (Non-Medical): No  Physical Activity: Inactive (12/20/2019)   Exercise Vital Sign    Days of Exercise per Week: 0 days    Minutes of Exercise per Session: 0 min  Stress: No Stress Concern Present (12/20/2019)   [employer] of Occupational Health - Occupational Stress Questionnaire    Feeling of Stress : Not at all  Social Connections: Moderately Integrated (12/20/2019)   Social Connection and Isolation Panel [NHANES]    Frequency of Communication with Friends and Family: More than three times a week    Frequency of Social Gatherings with Friends and Family: Once a week    Attends Religious Services: More than 4 times per year    Active Member of [employer] or Organizations: Yes    Attends [employer]  or Organization Meetings: Never    Marital Status: Widowed  Intimate Partner Violence: Not At Risk (12/20/2019)   Humiliation, Afraid, Rape, and Kick questionnaire    Fear of Current or Ex-Partner: No    Emotionally Abused: No    Physically Abused: No    Sexually Abused: No    FAMILY HISTORY:  Family History  Problem Relation Age of Onset   Dementia Mother    Cancer Mother        colon   COPD Father    Ulcerative colitis Father    Heart attack Father     CURRENT MEDICATIONS:  Current Outpatient Medications  Medication Sig Dispense Refill   APPLE CIDER VINEGAR PO Take 30 mLs by mouth daily.     aspirin EC 81 MG tablet Take 81 mg by mouth daily.      Aspirin-Acetaminophen-Caffeine 500-325-65 MG PACK Take by mouth daily as needed.     b complex vitamins tablet Take 1 tablet by mouth daily.     calcium carbonate (OS-CAL) 600 MG TABS tablet Take 600 mg by mouth 2 (two) times daily with a meal.      Cranberry 500 MG CAPS Take 2 capsules by mouth daily.      folic acid (FOLVITE) 1 MG tablet TAKE 1 TABLET BY MOUTH ONCE DAILY. 30 tablet 0   GARLIC OIL PO Take 1 capsule by mouth daily.     levothyroxine (SYNTHROID) 75 MCG tablet Take 75 mcg by mouth daily.     Magnesium 250 MG TABS Take 1 tablet by mouth daily.     metFORMIN (GLUCOPHAGE) 500 MG tablet      metoprolol succinate (TOPROL-XL) 25 MG 24 hr tablet Take 25 mg by mouth daily.     Misc Natural Products (PROSTATE CONTROL PO) Take 1 tablet by mouth daily.     Multiple Vitamins-Minerals (MULTI FOR HIM 50+) TABS Take 1 tablet by mouth daily.      Omega 3-6-9 CAPS Take 1 capsule by mouth daily.      ONETOUCH VERIO test strip      pioglitazone (ACTOS) 15 MG tablet Take 15 mg by mouth daily.     POTASSIUM PO Take 99 mg by mouth daily.      psyllium (METAMUCIL) 58.6 % powder Take 1 packet by mouth daily.      Red Yeast Rice 600 MG TABS Take 1 tablet by mouth daily.      rosuvastatin (CRESTOR) 5 MG tablet Take by mouth.     Saw Palmetto 160 MG CAPS Take 1 capsule by mouth daily.      vitamin B-12 (CYANOCOBALAMIN) 1000 MCG tablet Take 1,000 mcg by mouth daily.     Zinc Sulfate (ZINC 15 PO) Take by mouth. Patient does not know the correct dose     No current facility-administered medications for this visit.    ALLERGIES:  No Known Allergies  PHYSICAL EXAM:  Performance status (ECOG): 1 - Symptomatic but completely ambulatory  Vitals:   10/01/21 1506  BP: (!) 161/83  Pulse: 71  Resp: 18  Temp: (!) 97.2 F (36.2 C)  SpO2: 99%   Wt Readings from Last 3 Encounters:  10/01/21 229 lb (103.9 kg)  03/27/21 227 lb 11.2 oz (103.3 kg)  01/31/21 232 lb 6.4 oz (105.4 kg)   Physical  Exam Vitals reviewed.  Constitutional:      Appearance: Normal appearance.  Cardiovascular:     Rate and Rhythm: Normal rate and regular rhythm.  Pulses: Normal pulses.     Heart sounds: Normal heart sounds.  Pulmonary:     Effort: Pulmonary effort is normal.     Breath sounds: Normal breath sounds.  Neurological:     General: No focal deficit present.     Mental Status: He is alert and oriented to person, place, and time.  Psychiatric:        Mood and Affect: Mood normal.        Behavior: Behavior normal.     LABORATORY DATA:  I have reviewed the labs as listed.     Latest Ref Rng & Units 10/01/2021   10:12 AM 03/20/2021   10:18 AM 11/17/2020   11:01 AM  CBC  WBC 4.0 - 10.5 K/uL 5.3  5.3  6.0   Hemoglobin 13.0 - 17.0 g/dL 12.6  12.2  12.0   Hematocrit 39.0 - 52.0 % 37.4  33.1  37.6   Platelets 150 - 400 K/uL 191  184  215       Latest Ref Rng & Units 10/01/2021   10:12 AM 03/20/2021   10:18 AM 11/17/2020   11:01 AM  CMP  Glucose 70 - 99 mg/dL   153   BUN 8 - 23 mg/dL   18   Creatinine 0.61 - 1.24 mg/dL   1.11   Sodium 135 - 145 mmol/L   138   Potassium 3.5 - 5.1 mmol/L   3.9   Chloride 98 - 111 mmol/L   105   CO2 22 - 32 mmol/L   28   Calcium 8.9 - 10.3 mg/dL   8.7   Total Protein 6.5 - 8.1 g/dL 6.9  6.9  6.8   Total Bilirubin 0.3 - 1.2 mg/dL 1.7  1.7  2.1   Alkaline Phos 38 - 126 U/L 67  74  75   AST 15 - 41 U/L 31  33  34   ALT 0 - 44 U/L 24  29  29        Component Value Date/Time   RBC 3.72 (L) 10/01/2021 1012   RBC 3.78 (L) 10/01/2021 1012   MCV 98.9 10/01/2021 1012   MCH 33.3 10/01/2021 1012   MCHC 33.7 10/01/2021 1012   RDW 13.4 10/01/2021 1012   LYMPHSABS 0.9 10/01/2021 1012   MONOABS 0.5 10/01/2021 1012   EOSABS 0.3 10/01/2021 1012   BASOSABS 0.1 10/01/2021 1012    DIAGNOSTIC IMAGING:  I have independently reviewed the scans and discussed with the patient. No results found.   ASSESSMENT:  1.  Autoimmune hemolytic anemia: - Diagnosed in  August 2014, treated with high-dose prednisone. - Rituximab weekly x4 in June 2015. - He is on prednisone 5 mg daily, most recent taper was in May 2019 from 10 mg daily. -He took 5 mg daily for a long time.  Prednisone was completely tapered off in November 2021.   2.  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  He is on metformin and Actos.   PLAN:  1.  Autoimmune hemolytic anemia: - We reviewed labs from 10/01/2021 which showed total bilirubin 1.7 and indirect bilirubin 1.4.  LDH is 264 and stable.  Hemoglobin is 12.6 and stable.  Reticulocyte count is 5.3%. - He has continued mild basal hemolysis contributing to elevated bilirubin, LDH and reticulocyte count.  He has been off of prednisone for more than a year. - No indication to start back on prednisone at this time.  RTC 6 months for follow-up with repeat labs.  Orders placed  this encounter:  Orders Placed This Encounter  Procedures   CBC with Differential/Platelet   Comprehensive metabolic panel   Lactate dehydrogenase   Reticulocytes      [name] Jack, MD [place] [phone]

## 2021-10-01 NOTE — Patient Instructions (Signed)
[facility] at [facility] Discharge Instructions  You were seen and examined today by Dr. Ellin Saba.  Dr. Ellin Saba discussed your most recent lab work which revealed that everything looks stable.  Keep a log of your blood pressure at home to see if it is staying high.  Follow-up as scheduled in 6 months with labs.     Thank you for choosing [facility] at [facility] to provide your oncology and hematology care.  To afford each patient quality time with our provider, please arrive at least 15 minutes before your scheduled appointment time.   If you have a lab appointment with the Cancer Center please come in thru the Main Entrance and check in at the main information desk.  You need to re-schedule your appointment should you arrive 10 or more minutes late.  We strive to give you quality time with our providers, and arriving late affects you and other patients whose appointments are after yours.  Also, if you no show three or more times for appointments you may be dismissed from the clinic at the providers discretion.     Again, thank you for choosing [facility].  Our hope is that these requests will decrease the amount of time that you wait before being seen by our physicians.       _____________________________________________________________  Should you have questions after your visit to [facility], please contact our office at [phone] and follow the prompts.  Our office hours are 8:00 a.m. and 4:30 p.m. Monday - Friday.  Please note that voicemails left after 4:00 p.m. may not be returned until the following business day.  We are closed weekends and major holidays.  You do have access to a nurse 24-7, just call the main number to the clinic [phone] and do not press any options, hold on the line and a nurse will answer the phone.    For prescription refill requests, have your pharmacy contact our office  and allow 72 hours.

## 2021-10-02 ENCOUNTER — Other Ambulatory Visit: Payer: Self-pay | Admitting: Physician Assistant

## 2021-10-02 DIAGNOSIS — D591 Autoimmune hemolytic anemia, unspecified: Secondary | ICD-10-CM

## 2021-10-04 ENCOUNTER — Ambulatory Visit: Payer: Medicare Other | Admitting: Cardiology

## 2021-10-11 ENCOUNTER — Encounter: Payer: Self-pay | Admitting: Cardiology

## 2021-11-02 ENCOUNTER — Other Ambulatory Visit: Payer: Self-pay | Admitting: Physician Assistant

## 2021-11-02 DIAGNOSIS — D591 Autoimmune hemolytic anemia, unspecified: Secondary | ICD-10-CM

## 2021-11-18 NOTE — Progress Notes (Unsigned)
Cardiology Office Note  Date: 11/19/2021   ID: [name] Page 10/08/1945, MRN [id number]  PCP:  Glenda Chroman, MD  Cardiologist:  Rozann Lesches, MD Electrophysiologist:  None   Chief Complaint  Patient presents with   Cardiac follow-up    History of Present Illness: [name] Page is a 76 y.o. male last seen in December 2022.  He is here for a routine visit.  Reports no angina on current regimen.  He has had trouble with increase in blood pressure, Toprol-XL was just increased to 50 mg daily by Dr. Woody Seller.  Systolic still 379K to 240X at home.  He had a visit with Dr. Delton Coombes in August, I reviewed the note.  We went over his medications today.  Discussed addition of losartan.  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  I went over his recent lab work as well.  Past Medical History:  Diagnosis Date   Coronary atherosclerosis of native coronary artery    Multivessel, LVEF 59%   Essential hypertension    Hemolytic anemia (HCC)    Hyperlipidemia    Pernicious anemia     Past Surgical History:  Procedure Laterality Date   CORONARY ARTERY BYPASS GRAFT  2004   LIMA to LAD, SVG to OM, SVG to PDA    Current Outpatient Medications  Medication Sig Dispense Refill   APPLE CIDER VINEGAR PO Take 30 mLs by mouth daily.     aspirin EC 81 MG tablet Take 162 mg by mouth daily.     Aspirin-Acetaminophen-Caffeine 500-325-65 MG PACK Take by mouth daily as needed.     b complex vitamins tablet Take 1 tablet by mouth daily.     calcium carbonate (OS-CAL) 600 MG TABS tablet Take 600 mg by mouth 2 (two) times daily with a meal.      Cranberry 500 MG CAPS Take 2 capsules by mouth daily.      folic acid (FOLVITE) 1 MG tablet TAKE 1 TABLET BY MOUTH ONCE DAILY. 30 tablet 0   GARLIC OIL PO Take 1 capsule by mouth daily.     levothyroxine (SYNTHROID) 75 MCG tablet Take 75 mcg by mouth daily.     losartan (COZAAR) 25 MG tablet Take 1  tablet (25 mg total) by mouth daily. 90 tablet 1   Magnesium 250 MG TABS Take 1 tablet by mouth daily.     metFORMIN (GLUCOPHAGE) 500 MG tablet      metoprolol succinate (TOPROL-XL) 50 MG 24 hr tablet Take 25 mg by mouth 2 (two) times daily.     Misc Natural Products (PROSTATE CONTROL PO) Take 1 tablet by mouth daily.     Multiple Vitamins-Minerals (MULTI FOR HIM 50+) TABS Take 1 tablet by mouth daily.      Omega 3-6-9 CAPS Take 1 capsule by mouth daily.      ONETOUCH VERIO test strip      pioglitazone (ACTOS) 15 MG tablet Take 15 mg by mouth daily.     POTASSIUM PO Take 99 mg by mouth daily.      psyllium (METAMUCIL) 58.6 % powder Take 1 packet by mouth daily.      Red Yeast Rice 600 MG TABS Take 1 tablet by mouth daily.      rosuvastatin (CRESTOR) 5 MG tablet Take by mouth.     Saw Palmetto 160 MG CAPS Take 1 capsule by mouth daily.      vitamin B-12 (CYANOCOBALAMIN)  1000 MCG tablet Take 1,000 mcg by mouth daily.     Zinc Sulfate (ZINC 15 PO) Take by mouth. Patient does not know the correct dose     No current facility-administered medications for this visit.   Allergies:  Patient has no known allergies.   ROS: Palpitations or syncope.  Physical Exam: VS:  BP (!) 150/80   Pulse 68   Ht 6\' 1"  (1.854 m)   Wt 225 lb 12.8 oz (102.4 kg)   SpO2 98%   BMI 29.79 kg/m , BMI Body mass index is 29.79 kg/m.  Wt Readings from Last 3 Encounters:  11/19/21 225 lb 12.8 oz (102.4 kg)  10/01/21 229 lb (103.9 kg)  03/27/21 227 lb 11.2 oz (103.3 kg)    General: Patient appears comfortable at rest. HEENT: Conjunctiva and lids normal. Neck: Supple, no elevated JVP or carotid bruits. Lungs: Clear to auscultation, nonlabored breathing at rest. Cardiac: Regular rate and rhythm, no S3 or significant systolic murmur. Extremities: No pitting edema.  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  Recent  Labwork: 10/01/2021: ALT 24; AST 31; Hemoglobin 12.6; Platelets 191   Other Studies Reviewed Today:  Lexiscan Myoview 08/10/2018: No diagnostic ST segment changes to indicate ischemia. Occasional PACs and PVCs were noted without sustained arrhythmia. Moderate sized, severe intensity, fixed inferior defect also involving the inferior septal wall at the base most consistent with infarct scar. There is no active ischemia. This is a low risk study. Nuclear stress EF: 58%.  Assessment and Plan:  1.  Essential hypertension.  Recent systolics 0000000 to Q000111Q on average.  Plan to continue current regimen with addition of Cozaar 25 mg daily.  Continue to track blood pressure at home.  Check BMET in 2 weeks.  2.  Multivessel CAD status post CABG in 2004.  He reports no active angina on present regimen.  Myoview from 2020 was low risk.  Continue aspirin, Toprol-XL, Crestor, and now addition of Cozaar.  3.  History of hemolytic anemia, continued follow-up with Dr. Delton Coombes.  Recent hemoglobin 12.6.  He has been off steroids for about a year.  Medication Adjustments/Labs and Tests Ordered: Current medicines are reviewed at length with the patient today.  Concerns regarding medicines are outlined above.   Tests Ordered: Orders Placed This Encounter  Procedures   Basic metabolic panel   EKG [id number]    Medication Changes: Meds ordered this encounter  Medications   losartan (COZAAR) 25 MG tablet    Sig: Take 1 tablet (25 mg total) by mouth daily.    Dispense:  90 tablet    Refill:  1    11/19/21 New Start    Disposition:  Follow up  6 months.  Signed, Satira Sark, MD, [facility] 11/19/2021 9:43 AM    [place] at [place], [place],  [address] Phone: [phone]; Fax: [phone]

## 2021-11-19 ENCOUNTER — Encounter: Payer: Self-pay | Admitting: Cardiology

## 2021-11-19 ENCOUNTER — Ambulatory Visit: Payer: Medicare Other | Attending: Cardiology | Admitting: Cardiology

## 2021-11-19 VITALS — BP 150/80 | HR 68 | Ht 73.0 in | Wt 225.8 lb

## 2021-11-19 DIAGNOSIS — I25119 Atherosclerotic heart disease of native coronary artery with unspecified angina pectoris: Secondary | ICD-10-CM

## 2021-11-19 DIAGNOSIS — I10 Essential (primary) hypertension: Secondary | ICD-10-CM | POA: Diagnosis not present

## 2021-11-19 MED ORDER — LOSARTAN POTASSIUM 25 MG PO TABS: 25.0000 mg | ORAL_TABLET | Freq: Every day | ORAL | 1 refills | Status: DC

## 2021-11-19 NOTE — Patient Instructions (Addendum)
Medication Instructions:  Your physician has recommended you make the following change in your medication:  Start losartan 25 mg once a day Continue all other medications as directed  Labwork: BMET (2 weeks at [facility])  Testing/Procedures: none  Follow-Up:  Your physician recommends that you schedule a follow-up appointment in: 6 months  Any Other Special Instructions Will Be Listed Below (If Applicable).  If you need a refill on your cardiac medications before your next appointment, please call your pharmacy.

## 2021-12-03 ENCOUNTER — Other Ambulatory Visit: Payer: Self-pay | Admitting: Physician Assistant

## 2021-12-03 DIAGNOSIS — D591 Autoimmune hemolytic anemia, unspecified: Secondary | ICD-10-CM

## 2021-12-04 ENCOUNTER — Other Ambulatory Visit: Payer: Self-pay | Admitting: *Deleted

## 2021-12-04 DIAGNOSIS — D591 Autoimmune hemolytic anemia, unspecified: Secondary | ICD-10-CM

## 2021-12-04 MED ORDER — FOLIC ACID 1 MG PO TABS: 1.0000 mg | ORAL_TABLET | Freq: Every day | ORAL | 0 refills | Status: DC

## 2021-12-13 ENCOUNTER — Encounter: Payer: Self-pay | Admitting: *Deleted

## 2021-12-13 ENCOUNTER — Telehealth: Payer: Self-pay | Admitting: Cardiology

## 2021-12-13 NOTE — Telephone Encounter (Signed)
Follow Up:    Patient would like his lab resul.ts from 12-03-21 please.;

## 2021-12-13 NOTE — Telephone Encounter (Signed)
Says he did lab work at [facility]. Advised that lab result would be requested and he would be contacted after its reviewed by his provider. Verbalized understanding.

## 2021-12-13 NOTE — Telephone Encounter (Signed)
-----   Message from Satira Sark, MD sent at 12/13/2021  4:22 PM EDT ----- Results reviewed.  Potassium and renal function normal range.  Continue with current treatment plan.

## 2021-12-13 NOTE — Telephone Encounter (Signed)
Patient informed. Copy sent to PCP °

## 2021-12-26 ENCOUNTER — Ambulatory Visit: Payer: Medicare Other | Admitting: Cardiology

## 2021-12-28 ENCOUNTER — Ambulatory Visit: Payer: Medicare Other | Admitting: Cardiology

## 2022-01-02 ENCOUNTER — Other Ambulatory Visit: Payer: Self-pay | Admitting: Physician Assistant

## 2022-01-02 DIAGNOSIS — D591 Autoimmune hemolytic anemia, unspecified: Secondary | ICD-10-CM

## 2022-01-08 ENCOUNTER — Other Ambulatory Visit: Payer: Self-pay | Admitting: *Deleted

## 2022-01-08 DIAGNOSIS — D591 Autoimmune hemolytic anemia, unspecified: Secondary | ICD-10-CM

## 2022-01-08 MED ORDER — FOLIC ACID 1 MG PO TABS: 1.0000 mg | ORAL_TABLET | Freq: Every day | ORAL | 3 refills | Status: DC

## 2022-01-08 MED ORDER — FOLIC ACID 1 MG PO TABS: 1.0000 mg | ORAL_TABLET | Freq: Every day | ORAL | 0 refills | Status: DC

## 2022-03-20 ENCOUNTER — Encounter: Payer: Self-pay | Admitting: Internal Medicine

## 2022-03-21 ENCOUNTER — Other Ambulatory Visit: Payer: Self-pay | Admitting: *Deleted

## 2022-03-25 ENCOUNTER — Inpatient Hospital Stay: Payer: Medicare Other | Attending: Hematology

## 2022-03-25 DIAGNOSIS — D591 Autoimmune hemolytic anemia, unspecified: Secondary | ICD-10-CM | POA: Insufficient documentation

## 2022-03-25 DIAGNOSIS — Z79899 Other long term (current) drug therapy: Secondary | ICD-10-CM | POA: Insufficient documentation

## 2022-03-25 DIAGNOSIS — Z87891 Personal history of nicotine dependence: Secondary | ICD-10-CM | POA: Insufficient documentation

## 2022-03-25 LAB — CBC WITH DIFFERENTIAL/PLATELET
Abs Immature Granulocytes: 0.03 10*3/uL (ref 0.00–0.07)
Basophils Absolute: 0.1 10*3/uL (ref 0.0–0.1)
Basophils Relative: 1 %
Eosinophils Absolute: 0.2 10*3/uL (ref 0.0–0.5)
Eosinophils Relative: 5 %
HCT: 22.7 % — ABNORMAL LOW (ref 39.0–52.0)
Hemoglobin: 7.5 g/dL — ABNORMAL LOW (ref 13.0–17.0)
Immature Granulocytes: 1 %
Lymphocytes Relative: 16 %
Lymphs Abs: 0.9 10*3/uL (ref 0.7–4.0)
MCH: 41.4 pg — ABNORMAL HIGH (ref 26.0–34.0)
MCHC: 33 g/dL (ref 30.0–36.0)
MCV: 125.4 fL — ABNORMAL HIGH (ref 80.0–100.0)
Monocytes Absolute: 0.4 10*3/uL (ref 0.1–1.0)
Monocytes Relative: 7 %
Neutro Abs: 3.8 10*3/uL (ref 1.7–7.7)
Neutrophils Relative %: 70 %
Platelets: 215 10*3/uL (ref 150–400)
RBC: 1.81 MIL/uL — ABNORMAL LOW (ref 4.22–5.81)
RDW: 19.6 % — ABNORMAL HIGH (ref 11.5–15.5)
WBC: 5.4 10*3/uL (ref 4.0–10.5)
nRBC: 0 % (ref 0.0–0.2)

## 2022-03-25 LAB — COMPREHENSIVE METABOLIC PANEL
ALT: 21 U/L (ref 0–44)
AST: 42 U/L — ABNORMAL HIGH (ref 15–41)
Albumin: 3.7 g/dL (ref 3.5–5.0)
Alkaline Phosphatase: 82 U/L (ref 38–126)
Anion gap: 8 (ref 5–15)
BUN: 15 mg/dL (ref 8–23)
CO2: 26 mmol/L (ref 22–32)
Calcium: 8.6 mg/dL — ABNORMAL LOW (ref 8.9–10.3)
Chloride: 103 mmol/L (ref 98–111)
Creatinine, Ser: 1.17 mg/dL (ref 0.61–1.24)
GFR, Estimated: >60 mL/min (ref >60)
Glucose, Bld: 143 mg/dL — ABNORMAL HIGH (ref 70–99)
Potassium: 4.4 mmol/L (ref 3.5–5.1)
Sodium: 137 mmol/L (ref 135–145)
Total Bilirubin: 3.3 mg/dL — ABNORMAL HIGH (ref 0.3–1.2)
Total Protein: 6.7 g/dL (ref 6.5–8.1)

## 2022-03-25 LAB — LACTATE DEHYDROGENASE: LDH: 433 U/L — ABNORMAL HIGH (ref 98–192)

## 2022-03-25 LAB — RETICULOCYTES
Immature Retic Fract: 35.7 % — ABNORMAL HIGH (ref 2.3–15.9)
RBC.: 1.81 MIL/uL — ABNORMAL LOW (ref 4.22–5.81)
Retic Count, Absolute: 262.3 10*3/uL — ABNORMAL HIGH (ref 19.0–186.0)
Retic Ct Pct: 14.5 % — ABNORMAL HIGH (ref 0.4–3.1)

## 2022-03-26 ENCOUNTER — Telehealth: Payer: Self-pay

## 2022-03-26 ENCOUNTER — Inpatient Hospital Stay (HOSPITAL_BASED_OUTPATIENT_CLINIC_OR_DEPARTMENT_OTHER): Payer: Medicare Other | Admitting: Physician Assistant

## 2022-03-26 ENCOUNTER — Encounter: Payer: Self-pay | Admitting: Physician Assistant

## 2022-03-26 ENCOUNTER — Other Ambulatory Visit: Payer: Self-pay

## 2022-03-26 ENCOUNTER — Ambulatory Visit (HOSPITAL_COMMUNITY)
Admission: RE | Admit: 2022-03-26 | Discharge: 2022-03-26 | Disposition: A | Discharge status: Home / Self Care | Payer: Medicare Other | Source: Ambulatory Visit | Attending: Physician Assistant | Admitting: Physician Assistant

## 2022-03-26 ENCOUNTER — Inpatient Hospital Stay: Payer: Medicare Other

## 2022-03-26 VITALS — BP 148/74 | HR 95 | Temp 97.3°F | Resp 19 | Ht 73.0 in | Wt 218.0 lb

## 2022-03-26 DIAGNOSIS — R052 Subacute cough: Secondary | ICD-10-CM | POA: Insufficient documentation

## 2022-03-26 DIAGNOSIS — D591 Autoimmune hemolytic anemia, unspecified: Secondary | ICD-10-CM | POA: Diagnosis not present

## 2022-03-26 LAB — CBC WITH DIFFERENTIAL/PLATELET
Abs Immature Granulocytes: 0.02 10*3/uL (ref 0.00–0.07)
Basophils Absolute: 0.1 10*3/uL (ref 0.0–0.1)
Basophils Relative: 1 %
Eosinophils Absolute: 0.2 10*3/uL (ref 0.0–0.5)
Eosinophils Relative: 3 %
HCT: 20.7 % — ABNORMAL LOW (ref 39.0–52.0)
Hemoglobin: 7.7 g/dL — ABNORMAL LOW (ref 13.0–17.0)
Immature Granulocytes: 0 %
Lymphocytes Relative: 16 %
Lymphs Abs: 0.9 10*3/uL (ref 0.7–4.0)
MCH: 48.1 pg — ABNORMAL HIGH (ref 26.0–34.0)
MCHC: 37.2 g/dL — ABNORMAL HIGH (ref 30.0–36.0)
MCV: 129.4 fL — ABNORMAL HIGH (ref 80.0–100.0)
Monocytes Absolute: 0.4 10*3/uL (ref 0.1–1.0)
Monocytes Relative: 7 %
Neutro Abs: 3.9 10*3/uL (ref 1.7–7.7)
Neutrophils Relative %: 73 %
Platelets: 223 10*3/uL (ref 150–400)
RBC: 1.6 MIL/uL — ABNORMAL LOW (ref 4.22–5.81)
RDW: 22.3 % — ABNORMAL HIGH (ref 11.5–15.5)
WBC: 5.4 10*3/uL (ref 4.0–10.5)
nRBC: 0 % (ref 0.0–0.2)

## 2022-03-26 LAB — SAMPLE TO BLOOD BANK

## 2022-03-26 MED ORDER — PREDNISONE 50 MG PO TABS: 100.0000 mg | ORAL_TABLET | Freq: Every day | ORAL | 1 refills | Status: DC

## 2022-03-26 NOTE — Telephone Encounter (Signed)
-----   Message from [name] Page, [place] sent at 03/26/2022 11:22 AM EST ----- Since his hemoglobin is >7.0, we can cancel his blood transfusion appointment tomorrow.  Please call patient to let him know that no blood transfusion is needed this week.

## 2022-03-26 NOTE — Telephone Encounter (Signed)
Patient notified of lab work and message for no blood transfusion per the provider.  Patient verbalized understanding.

## 2022-03-26 NOTE — Patient Instructions (Signed)
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   Your labs show acute exacerbation of your hemolytic anemia. We will start you on high-dose prednisone (100 mg daily) We will check additional labs today to obtain your baseline haptoglobin levels. We will check chest x-ray today to evaluate for any possible pneumonia. If your hemoglobin drops to <7.0, we will recommend blood transfusion.  (This was confirmed with Dr. Delton Coombes, who agrees that blood transfusion would be indicated as a "holdover" until the steroids start to work.) We will check labs once a week for the next 4 weeks.  FOLLOW-UP APPOINTMENT: Office visit with Dr. Delton Coombes in 4 weeks.  ** Thank you for trusting me with your healthcare!  I strive to provide all of my patients with quality care at each visit.  If you receive a survey for this visit, I would be so grateful to you for taking the time to provide feedback.  Thank you in advance!  ~ Stewart Pimenta                   Dr. Derek Jack   &   Tarri Abernethy, PA-C   - - - - - - - - - - - - - - - - - -    Thank you for choosing [place] at [facility] to provide your oncology and hematology care.  To afford each patient quality time with our provider, please arrive at least 15 minutes before your scheduled appointment time.   If you have a lab appointment with the [facility] please come in thru the Main Entrance and check in at the main information desk.  You need to re-schedule your appointment should you arrive 10 or more minutes late.  We strive to give you quality time with our providers, and arriving late affects you and other patients whose appointments are after yours.  Also, if you no show three or more times for appointments you may be dismissed from the clinic at the providers discretion.     Again,  thank you for choosing [facility].  Our hope is that these requests will decrease the amount of time that you wait before being seen by our physicians.       _____________________________________________________________  Should you have questions after your visit to [facility], please contact our office at [phone] and follow the prompts.  Our office hours are 8:00 a.m. and 4:30 p.m. Monday - Friday.  Please note that voicemails left after 4:00 p.m. may not be returned until the following business day.  We are closed weekends and major holidays.  You do have access to a nurse 24-7, just call the main number to the clinic [phone] and do not press any options, hold on the line and a nurse will answer the phone.    For prescription refill requests, have your pharmacy contact our office and allow 72 hours.

## 2022-03-26 NOTE — Progress Notes (Signed)
[name] Page, [place] [id number]   CLINIC:  Medical Oncology/Hematology  PCP:  [name] Chroman, MD [place] [id number] [phone]   REASON FOR VISIT:  Follow-up for autoimmune hemolytic anemia  PRIOR THERAPY:  1. High dose prednisone in 09/2012. 2. Rituximab weekly x 4 in 07/2013. 3. Prednisone taper through 12/2019.  CURRENT THERAPY: Surveillance  INTERVAL HISTORY:   [name] 77 y.o. male returns for routine follow-up of his autoimmune hemolytic anemia.  He was last seen in clinic by Dr. Delton Page on 10/01/2021.  He was tapered off of prednisone by Dr. Delton Page over 2 years ago, and been doing fairly well until recently.  Most recent labs are consistent with an acute exacerbation of his hemolytic anemia.  He reports some chest congestion and productive cough ("creamy yellow" sputum) for the past 2 to 3 weeks.  He denies any fevers or chills.  He has not had any medication changes within the past month, but reports that he was started on losartan in October 2023.  He is symptomatic with worsening fatigue and dyspnea on exertion.  He denies any chest pain, lightheadedness, or syncope.  He notes that his skin is pale and slightly jaundiced compared to his baseline.  He has 25% energy and 50% appetite. He endorses that he is maintaining a stable weight.  ASSESSMENT & PLAN:  1.  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 - Rituximab weekly x4 in June 2015. - He is on prednisone 5 mg daily, most recent taper was in May 2019 from 10 mg daily. - He took prednisone 5 mg daily for approximately 8 years.  Prednisone was completely tapered off in November 2021. - She takes daily folic acid supplement - He has had mild baseline hemolysis since prednisone  was discontinued. - Most recent labs (03/25/2022) consistent with acute hemolytic anemia as evidenced by Hgb 7.5/MCV 125.4, bilirubin 3.3, LDH 433, reticulocytes 14.5%. -Reports chest congestion and productive cough ("creamy yellow" sputum) for the past 2 to 3 weeks.  No fevers or chills. - No medication changes within the past month, but reports that he was started on losartan in October 2023. - Symptomatic with worsening fatigue and dyspnea on exertion.  He denies any chest pain, lightheadedness, or syncope. - PLAN: Will start patient on high-dose prednisone (1 mg/kg) 100 mg daily - Check CXR today due to cough x 2 to 3 weeks - Check a baseline haptoglobin and Coombs test today - Repeat CBC/D+ BB sample today and transfuse if Hgb <7.0 - Weekly labs (CBC/D, BB sample, CMP, LDH, reticulocytes) x 4 weeks - MD office visit in 4 weeks (alternating MD/APP visits)  2.  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  He is on metformin and Actos.  PLAN SUMMARY: >> Labs TODAY (CBC/D, BB sample, haptoglobin, DAT/Coombs) >> We will tentatively schedule for PRBC transfusion tomorrow >> Weekly labs (CBC/D, BB sample, CMP, LDH, reticulocytes) >> MD OFFICE visit with DR. KATRAGADDA (1 day after lab check) in 4 weeks  **ALTERNATING MD/APP visits    **NOTE:  Patient exhibits hesitance regarding blood transfusions due to conversation he had with his previous hematologist at [facility].  Offered reassurance that per ASH guidelines, transfusion is indicated in AIHA if Hgb <7.0 as a temporary measure to provide adequate blood support until  steroids take effect.  This was discussed with Dr. Delton Page, who also agrees, especially since patient has history of CAD.  Per ASH recommendations for transfusion support in patients with AIHA: "Generally, the decision to transfuse AIHA patients should be based on similar indications as for anemic patients without AIHA. In asymptomatic patients, a restrictive transfusion strategy (ie,  Hgb <7?g/dL) should be practiced.21  The use of bed rest and supplemental oxygen in patients with severe anemia (ie, Hgb <6?g/dL) could help defer transfusion until testing is complete. The patient's clinical status, comorbidities, and symptoms should guide the decision to transfuse regardless of Hgb level and compatibility test results. When symptoms of significant hypoxia, confusion, angina, or hemodynamic instability are present, RBC transfusion should not be withheld, even when testing is incomplete. The presence of reticulocytopenia in an AIHA patient with severe anemia should be regarded as a medical emergency. Reticulocytopenia is a poor prognostic indicator representing an inadequate erythropoietic response and higher transfusion requirements.16,22   When transfusion is medically necessary, the patient's physician should be assured that transfused RBCS are unlikely to cause an acute hemolytic transfusion reaction. The risk for a transfusion reaction in patients with AIHA appears to be no higher than in other patients requiring transfusion. Chen et?al.23  found that reactions occurred in 1.6% of 450 transfused AIHA patients, with febrile and allergic reactions the most frequent and no reports of hemolytic adverse events. Most patients with AIHA tolerate serologically incompatible RBC transfusions quite well, without a significant increase in their underlying hemolysis."  SOURCE:  American Society of Hematology (2022) - AnMRI.uy.NV:[id number]     REVIEW OF SYSTEMS:   Review of Systems  Constitutional:  Positive for fatigue. Negative for appetite change, chills, diaphoresis, fever and unexpected weight change.  HENT:   Negative for lump/mass and nosebleeds.   Eyes:  Negative for eye problems.  Respiratory:  Positive for cough and shortness of breath. Negative for hemoptysis.   Cardiovascular:  Negative for chest pain, leg swelling and palpitations.  Gastrointestinal:   Negative for abdominal pain, blood in stool, constipation, diarrhea, nausea and vomiting.  Genitourinary:  Negative for hematuria.   Skin: Negative.   Neurological:  Positive for numbness. Negative for dizziness, headaches and light-headedness.  Hematological:  Does not bruise/bleed easily.  Psychiatric/Behavioral:  Positive for sleep disturbance.      PHYSICAL EXAM:  ECOG PERFORMANCE STATUS: 1 - Symptomatic but completely ambulatory  There were no vitals filed for this visit. There were no vitals filed for this visit. Physical Exam Constitutional:      Appearance: Normal appearance. He is obese.  HENT:     Head: Normocephalic and atraumatic.     Mouth/Throat:     Mouth: Mucous membranes are moist.  Eyes:     General: Scleral icterus present.     Extraocular Movements: Extraocular movements intact.     Pupils: Pupils are equal, round, and reactive to light.  Cardiovascular:     Rate and Rhythm: Regular rhythm. Tachycardia present.     Pulses: Normal pulses.     Heart sounds: Normal heart sounds.  Pulmonary:     Effort: Pulmonary effort is normal.     Breath sounds: Decreased breath sounds present.  Abdominal:     General: Bowel sounds are normal.     Palpations: Abdomen is soft.     Tenderness: There is no abdominal tenderness.  Musculoskeletal:        General: No swelling.     Right lower leg: No edema.  Left lower leg: No edema.  Lymphadenopathy:     Cervical: No cervical adenopathy.  Skin:    General: Skin is warm and dry.     Coloration: Skin is jaundiced and pale.  Neurological:     General: No focal deficit present.     Mental Status: He is alert and oriented to person, place, and time.  Psychiatric:        Mood and Affect: Mood normal.        Behavior: Behavior normal.    PAST MEDICAL/SURGICAL HISTORY:  Past Medical History:  Diagnosis Date   Coronary atherosclerosis of native coronary artery    Multivessel, LVEF 59%   Essential hypertension     Hemolytic anemia (HCC)    Hyperlipidemia    Pernicious anemia    Past Surgical History:  Procedure Laterality Date   CORONARY ARTERY BYPASS GRAFT  2004   LIMA to LAD, SVG to OM, SVG to PDA    SOCIAL HISTORY:  Social History   Socioeconomic History   Marital status: Widowed    Spouse name: Not on file   Number of children: Not on file   Years of education: Not on file   Highest education level: Not on file  Occupational History   Occupation: Full time  Tobacco Use   Smoking status: Former    Types: Cigarettes    Quit date: 02/11/2002    Years since quitting: 20.1   Smokeless tobacco: Never  Substance and Sexual Activity   Alcohol use: No    Alcohol/week: 0.0 standard drinks of alcohol   Drug use: No   Sexual activity: Not on file  Other Topics Concern   Not on file  Social History Narrative   Not on file   Social Determinants of Health   Financial Resource Strain: Low Risk  (12/20/2019)   Overall Financial Resource Strain (CARDIA)    Difficulty of Paying Living Expenses: Not hard at all  Food Insecurity: No Food Insecurity (12/20/2019)   Hunger Vital Sign    Worried About Running Out of Food in the Last Year: Never true    [place] in the Last Year: Never true  Transportation Needs: No Transportation Needs (12/20/2019)   PRAPARE - Hydrologist (Medical): No    Lack of Transportation (Non-Medical): No  Physical Activity: Inactive (12/20/2019)   Exercise Vital Sign    Days of Exercise per Week: 0 days    Minutes of Exercise per Session: 0 min  Stress: No Stress Concern Present (12/20/2019)   [place]    Feeling of Stress : Not at all  Social Connections: Moderately Integrated (12/20/2019)   Social Connection and Isolation Panel [NHANES]    Frequency of Communication with Friends and Family: More than three times a week    Frequency of Social Gatherings with Friends  and Family: Once a week    Attends Religious Services: More than 4 times per year    Active Member of [employer] or Organizations: Yes    Attends Archivist Meetings: Never    Marital Status: Widowed  Intimate Partner Violence: Not At Risk (12/20/2019)   Humiliation, Afraid, Rape, and Kick questionnaire    Fear of Current or Ex-Partner: No    Emotionally Abused: No    Physically Abused: No    Sexually Abused: No    FAMILY HISTORY:  Family History  Problem Relation Age of Onset  Dementia Mother    Cancer Mother        colon   COPD Father    Ulcerative colitis Father    Heart attack Father     CURRENT MEDICATIONS:  Outpatient Encounter Medications as of 03/26/2022  Medication Sig Note   APPLE CIDER VINEGAR PO Take 30 mLs by mouth daily.    aspirin EC 81 MG tablet Take 162 mg by mouth daily.    Aspirin-Acetaminophen-Caffeine 500-325-65 MG PACK Take by mouth daily as needed. 09/01/2015: Received from: [place]   b complex vitamins tablet Take 1 tablet by mouth daily.    calcium carbonate (OS-CAL) 600 MG TABS tablet Take 600 mg by mouth 2 (two) times daily with a meal.     Cranberry 500 MG CAPS Take 2 capsules by mouth daily.  09/01/2015: Received from: [place]   folic acid (FOLVITE) 1 MG tablet TAKE 1 TABLET BY MOUTH ONCE DAILY.    GARLIC OIL PO Take 1 capsule by mouth daily.    levothyroxine (SYNTHROID) 75 MCG tablet Take 75 mcg by mouth daily.    losartan (COZAAR) 25 MG tablet Take 1 tablet (25 mg total) by mouth daily.    Magnesium 250 MG TABS Take 1 tablet by mouth daily.    metFORMIN (GLUCOPHAGE) 500 MG tablet     metoprolol succinate (TOPROL-XL) 50 MG 24 hr tablet Take 25 mg by mouth 2 (two) times daily.    Misc Natural Products (PROSTATE CONTROL PO) Take 1 tablet by mouth daily.    Multiple Vitamins-Minerals (MULTI FOR HIM 50+) TABS Take 1 tablet by mouth daily.  09/01/2015: Received from: [place] 3-6-9 CAPS Take 1 capsule by  mouth daily.  09/01/2015: Received from: [place] test strip     pioglitazone (ACTOS) 15 MG tablet Take 15 mg by mouth daily.    POTASSIUM PO Take 99 mg by mouth daily.     psyllium (METAMUCIL) 58.6 % powder Take 1 packet by mouth daily.     Red Yeast Rice 600 MG TABS Take 1 tablet by mouth daily.  09/01/2015: Received from: [place]   rosuvastatin (CRESTOR) 5 MG tablet Take by mouth.    Saw Palmetto 160 MG CAPS Take 1 capsule by mouth daily.  09/01/2015: Received from: [place]   vitamin B-12 (CYANOCOBALAMIN) 1000 MCG tablet Take 1,000 mcg by mouth daily.    Zinc Sulfate (ZINC 15 PO) Take by mouth. Patient does not know the correct dose    No facility-administered encounter medications on file as of 03/26/2022.    ALLERGIES:  No Known Allergies  LABORATORY DATA:  I have reviewed the labs as listed.  CBC    Component Value Date/Time   WBC 5.4 03/25/2022 0835   RBC 1.81 (L) 03/25/2022 0835   RBC 1.81 (L) 03/25/2022 0835   HGB 7.5 (L) 03/25/2022 0835   HCT 22.7 (L) 03/25/2022 0835   PLT 215 03/25/2022 0835   MCV 125.4 (H) 03/25/2022 0835   MCH 41.4 (H) 03/25/2022 0835   MCHC 33.0 03/25/2022 0835   RDW 19.6 (H) 03/25/2022 0835   LYMPHSABS 0.9 03/25/2022 0835   MONOABS 0.4 03/25/2022 0835   EOSABS 0.2 03/25/2022 0835   BASOSABS 0.1 03/25/2022 0835      Latest Ref Rng & Units 03/25/2022    8:35 AM 10/01/2021   10:12 AM 03/20/2021   10:18 AM  CMP  Glucose 70 - 99 mg/dL  143     BUN 8 - 23 mg/dL 15     Creatinine 0.61 - 1.24 mg/dL 1.17     Sodium 135 - 145 mmol/L 137     Potassium 3.5 - 5.1 mmol/L 4.4     Chloride 98 - 111 mmol/L 103     CO2 22 - 32 mmol/L 26     Calcium 8.9 - 10.3 mg/dL 8.6     Total Protein 6.5 - 8.1 g/dL 6.7  6.9  6.9   Total Bilirubin 0.3 - 1.2 mg/dL 3.3  1.7  1.7   Alkaline Phos 38 - 126 U/L 82  67  74   AST 15 - 41 U/L 42  31  33   ALT 0 - 44 U/L 21  24  29     $ DIAGNOSTIC IMAGING:  I have independently  reviewed the relevant imaging and discussed with the patient.   WRAP UP:  All questions were answered. The patient knows to call the clinic with any problems, questions or concerns.  Medical decision making: Moderate  Time spent on visit: I spent 20 minutes counseling the patient face to face. The total time spent in the appointment was 30 minutes and more than 50% was on counseling.  Harriett Rush, PA-C  03/26/22 11:40 AM

## 2022-03-27 ENCOUNTER — Inpatient Hospital Stay: Payer: Medicare Other

## 2022-03-27 LAB — DIRECT ANTIGLOBULIN TEST (NOT AT ARMC)
DAT, IgG: POSITIVE
DAT, complement: POSITIVE

## 2022-03-28 ENCOUNTER — Ambulatory Visit: Payer: Medicare Other | Admitting: Physician Assistant

## 2022-04-02 ENCOUNTER — Inpatient Hospital Stay: Payer: Medicare Other

## 2022-04-02 DIAGNOSIS — D591 Autoimmune hemolytic anemia, unspecified: Secondary | ICD-10-CM

## 2022-04-02 LAB — CBC WITH DIFFERENTIAL/PLATELET
Abs Immature Granulocytes: 0.05 10*3/uL (ref 0.00–0.07)
Basophils Absolute: 0 10*3/uL (ref 0.0–0.1)
Basophils Relative: 0 %
Eosinophils Absolute: 0 10*3/uL (ref 0.0–0.5)
Eosinophils Relative: 0 %
HCT: 30.8 % — ABNORMAL LOW (ref 39.0–52.0)
Hemoglobin: 10.1 g/dL — ABNORMAL LOW (ref 13.0–17.0)
Immature Granulocytes: 1 %
Lymphocytes Relative: 16 %
Lymphs Abs: 1.4 10*3/uL (ref 0.7–4.0)
MCH: 38.8 pg — ABNORMAL HIGH (ref 26.0–34.0)
MCHC: 32.8 g/dL (ref 30.0–36.0)
MCV: 118.5 fL — ABNORMAL HIGH (ref 80.0–100.0)
Monocytes Absolute: 0.7 10*3/uL (ref 0.1–1.0)
Monocytes Relative: 8 %
Neutro Abs: 6.5 10*3/uL (ref 1.7–7.7)
Neutrophils Relative %: 75 %
Platelets: 221 10*3/uL (ref 150–400)
RBC: 2.6 MIL/uL — ABNORMAL LOW (ref 4.22–5.81)
RDW: 15.2 % (ref 11.5–15.5)
WBC Morphology: REACTIVE
WBC: 8.7 10*3/uL (ref 4.0–10.5)
nRBC: 0 % (ref 0.0–0.2)

## 2022-04-02 LAB — SAMPLE TO BLOOD BANK

## 2022-04-02 LAB — COMPREHENSIVE METABOLIC PANEL
ALT: 41 U/L (ref 0–44)
AST: 27 U/L (ref 15–41)
Albumin: 3.4 g/dL — ABNORMAL LOW (ref 3.5–5.0)
Alkaline Phosphatase: 63 U/L (ref 38–126)
Anion gap: 4 — ABNORMAL LOW (ref 5–15)
BUN: 28 mg/dL — ABNORMAL HIGH (ref 8–23)
CO2: 30 mmol/L (ref 22–32)
Calcium: 8.7 mg/dL — ABNORMAL LOW (ref 8.9–10.3)
Chloride: 102 mmol/L (ref 98–111)
Creatinine, Ser: 1.23 mg/dL (ref 0.61–1.24)
GFR, Estimated: >60 mL/min (ref >60)
Glucose, Bld: 188 mg/dL — ABNORMAL HIGH (ref 70–99)
Potassium: 4.2 mmol/L (ref 3.5–5.1)
Sodium: 136 mmol/L (ref 135–145)
Total Bilirubin: 2.1 mg/dL — ABNORMAL HIGH (ref 0.3–1.2)
Total Protein: 6 g/dL — ABNORMAL LOW (ref 6.5–8.1)

## 2022-04-02 LAB — RETICULOCYTES
Immature Retic Fract: 25.8 % — ABNORMAL HIGH (ref 2.3–15.9)
RBC.: 2.55 MIL/uL — ABNORMAL LOW (ref 4.22–5.81)
Retic Count, Absolute: 319 10*3/uL — ABNORMAL HIGH (ref 19.0–186.0)
Retic Ct Pct: 12.5 % — ABNORMAL HIGH (ref 0.4–3.1)

## 2022-04-02 LAB — LACTATE DEHYDROGENASE: LDH: 228 U/L — ABNORMAL HIGH (ref 98–192)

## 2022-04-03 ENCOUNTER — Other Ambulatory Visit: Payer: Medicare Other

## 2022-04-03 LAB — HAPTOGLOBIN: Haptoglobin: <10 mg/dL — ABNORMAL LOW (ref 34–355)

## 2022-04-09 ENCOUNTER — Inpatient Hospital Stay: Payer: Medicare Other

## 2022-04-09 ENCOUNTER — Telehealth: Payer: Self-pay | Admitting: *Deleted

## 2022-04-09 DIAGNOSIS — D591 Autoimmune hemolytic anemia, unspecified: Secondary | ICD-10-CM

## 2022-04-09 LAB — COMPREHENSIVE METABOLIC PANEL
ALT: 36 U/L (ref 0–44)
AST: 29 U/L (ref 15–41)
Albumin: 3.4 g/dL — ABNORMAL LOW (ref 3.5–5.0)
Alkaline Phosphatase: 63 U/L (ref 38–126)
Anion gap: 11 (ref 5–15)
BUN: 33 mg/dL — ABNORMAL HIGH (ref 8–23)
CO2: 24 mmol/L (ref 22–32)
Calcium: 8.8 mg/dL — ABNORMAL LOW (ref 8.9–10.3)
Chloride: 102 mmol/L (ref 98–111)
Creatinine, Ser: 0.97 mg/dL (ref 0.61–1.24)
GFR, Estimated: >60 mL/min (ref >60)
Glucose, Bld: 270 mg/dL — ABNORMAL HIGH (ref 70–99)
Potassium: 3.8 mmol/L (ref 3.5–5.1)
Sodium: 137 mmol/L (ref 135–145)
Total Bilirubin: 2 mg/dL — ABNORMAL HIGH (ref 0.3–1.2)
Total Protein: 5.8 g/dL — ABNORMAL LOW (ref 6.5–8.1)

## 2022-04-09 LAB — RETICULOCYTES
Immature Retic Fract: 10.7 % (ref 2.3–15.9)
RBC.: 3.7 MIL/uL — ABNORMAL LOW (ref 4.22–5.81)
Retic Count, Absolute: 175.8 10*3/uL (ref 19.0–186.0)
Retic Ct Pct: 4.8 % — ABNORMAL HIGH (ref 0.4–3.1)

## 2022-04-09 LAB — SAMPLE TO BLOOD BANK

## 2022-04-09 LAB — CBC WITH DIFFERENTIAL/PLATELET
Abs Immature Granulocytes: 0.09 10*3/uL — ABNORMAL HIGH (ref 0.00–0.07)
Basophils Absolute: 0 10*3/uL (ref 0.0–0.1)
Basophils Relative: 0 %
Eosinophils Absolute: 0 10*3/uL (ref 0.0–0.5)
Eosinophils Relative: 0 %
HCT: 39.9 % (ref 39.0–52.0)
Hemoglobin: 12.9 g/dL — ABNORMAL LOW (ref 13.0–17.0)
Immature Granulocytes: 1 %
Lymphocytes Relative: 6 %
Lymphs Abs: 1.1 10*3/uL (ref 0.7–4.0)
MCH: 34.6 pg — ABNORMAL HIGH (ref 26.0–34.0)
MCHC: 32.3 g/dL (ref 30.0–36.0)
MCV: 107 fL — ABNORMAL HIGH (ref 80.0–100.0)
Monocytes Absolute: 1 10*3/uL (ref 0.1–1.0)
Monocytes Relative: 6 %
Neutro Abs: 15.6 10*3/uL — ABNORMAL HIGH (ref 1.7–7.7)
Neutrophils Relative %: 87 %
Platelets: 227 10*3/uL (ref 150–400)
RBC: 3.73 MIL/uL — ABNORMAL LOW (ref 4.22–5.81)
RDW: 13.1 % (ref 11.5–15.5)
WBC: 17.9 10*3/uL — ABNORMAL HIGH (ref 4.0–10.5)
nRBC: 0 % (ref 0.0–0.2)

## 2022-04-09 LAB — LACTATE DEHYDROGENASE: LDH: 193 U/L — ABNORMAL HIGH (ref 98–192)

## 2022-04-09 NOTE — Telephone Encounter (Signed)
Patient called to inform of improving lab values, per Elmer Picker, PA-C.  Lab appointment cancelled per her request.

## 2022-04-10 ENCOUNTER — Ambulatory Visit: Payer: Medicare Other | Admitting: Physician Assistant

## 2022-04-16 ENCOUNTER — Inpatient Hospital Stay: Payer: Medicare Other

## 2022-04-23 ENCOUNTER — Inpatient Hospital Stay: Payer: Medicare Other | Attending: Hematology

## 2022-04-23 DIAGNOSIS — I10 Essential (primary) hypertension: Secondary | ICD-10-CM | POA: Insufficient documentation

## 2022-04-23 DIAGNOSIS — Z87891 Personal history of nicotine dependence: Secondary | ICD-10-CM | POA: Diagnosis not present

## 2022-04-23 DIAGNOSIS — Z825 Family history of asthma and other chronic lower respiratory diseases: Secondary | ICD-10-CM | POA: Diagnosis not present

## 2022-04-23 DIAGNOSIS — D591 Autoimmune hemolytic anemia, unspecified: Secondary | ICD-10-CM | POA: Insufficient documentation

## 2022-04-23 DIAGNOSIS — Z800 Family history of malignant neoplasm of digestive organs: Secondary | ICD-10-CM | POA: Insufficient documentation

## 2022-04-23 DIAGNOSIS — Z818 Family history of other mental and behavioral disorders: Secondary | ICD-10-CM | POA: Insufficient documentation

## 2022-04-23 DIAGNOSIS — G479 Sleep disorder, unspecified: Secondary | ICD-10-CM | POA: Diagnosis not present

## 2022-04-23 DIAGNOSIS — Z7952 Long term (current) use of systemic steroids: Secondary | ICD-10-CM | POA: Insufficient documentation

## 2022-04-23 DIAGNOSIS — Z8379 Family history of other diseases of the digestive system: Secondary | ICD-10-CM | POA: Diagnosis not present

## 2022-04-23 DIAGNOSIS — Z8249 Family history of ischemic heart disease and other diseases of the circulatory system: Secondary | ICD-10-CM | POA: Diagnosis not present

## 2022-04-23 DIAGNOSIS — E119 Type 2 diabetes mellitus without complications: Secondary | ICD-10-CM | POA: Diagnosis not present

## 2022-04-23 DIAGNOSIS — M7989 Other specified soft tissue disorders: Secondary | ICD-10-CM | POA: Insufficient documentation

## 2022-04-23 DIAGNOSIS — Z79899 Other long term (current) drug therapy: Secondary | ICD-10-CM | POA: Insufficient documentation

## 2022-04-23 LAB — CBC WITH DIFFERENTIAL/PLATELET
Abs Immature Granulocytes: 0.07 10*3/uL (ref 0.00–0.07)
Basophils Absolute: 0 10*3/uL (ref 0.0–0.1)
Basophils Relative: 0 %
Eosinophils Absolute: 0 10*3/uL (ref 0.0–0.5)
Eosinophils Relative: 0 %
HCT: 44.6 % (ref 39.0–52.0)
Hemoglobin: 15.2 g/dL (ref 13.0–17.0)
Immature Granulocytes: 1 %
Lymphocytes Relative: 8 %
Lymphs Abs: 1 10*3/uL (ref 0.7–4.0)
MCH: 33.7 pg (ref 26.0–34.0)
MCHC: 34.1 g/dL (ref 30.0–36.0)
MCV: 98.9 fL (ref 80.0–100.0)
Monocytes Absolute: 0.6 10*3/uL (ref 0.1–1.0)
Monocytes Relative: 5 %
Neutro Abs: 11.5 10*3/uL — ABNORMAL HIGH (ref 1.7–7.7)
Neutrophils Relative %: 86 %
Platelets: 149 10*3/uL — ABNORMAL LOW (ref 150–400)
RBC: 4.51 MIL/uL (ref 4.22–5.81)
RDW: 12.7 % (ref 11.5–15.5)
WBC: 13.2 10*3/uL — ABNORMAL HIGH (ref 4.0–10.5)
nRBC: 0 % (ref 0.0–0.2)

## 2022-04-23 LAB — COMPREHENSIVE METABOLIC PANEL
ALT: 42 U/L (ref 0–44)
AST: 24 U/L (ref 15–41)
Albumin: 3.4 g/dL — ABNORMAL LOW (ref 3.5–5.0)
Alkaline Phosphatase: 66 U/L (ref 38–126)
Anion gap: 7 (ref 5–15)
BUN: 32 mg/dL — ABNORMAL HIGH (ref 8–23)
CO2: 29 mmol/L (ref 22–32)
Calcium: 8.8 mg/dL — ABNORMAL LOW (ref 8.9–10.3)
Chloride: 97 mmol/L — ABNORMAL LOW (ref 98–111)
Creatinine, Ser: 1.12 mg/dL (ref 0.61–1.24)
GFR, Estimated: >60 mL/min (ref >60)
Glucose, Bld: 338 mg/dL — ABNORMAL HIGH (ref 70–99)
Potassium: 4.8 mmol/L (ref 3.5–5.1)
Sodium: 133 mmol/L — ABNORMAL LOW (ref 135–145)
Total Bilirubin: 1.6 mg/dL — ABNORMAL HIGH (ref 0.3–1.2)
Total Protein: 5.9 g/dL — ABNORMAL LOW (ref 6.5–8.1)

## 2022-04-23 LAB — LACTATE DEHYDROGENASE: LDH: 220 U/L — ABNORMAL HIGH (ref 98–192)

## 2022-04-23 LAB — SAMPLE TO BLOOD BANK

## 2022-04-23 LAB — RETICULOCYTES
Immature Retic Fract: 7.5 % (ref 2.3–15.9)
RBC.: 4.52 MIL/uL (ref 4.22–5.81)
Retic Count, Absolute: 80.9 10*3/uL (ref 19.0–186.0)
Retic Ct Pct: 1.8 % (ref 0.4–3.1)

## 2022-04-24 ENCOUNTER — Inpatient Hospital Stay (HOSPITAL_BASED_OUTPATIENT_CLINIC_OR_DEPARTMENT_OTHER): Payer: Medicare Other | Admitting: Hematology

## 2022-04-24 ENCOUNTER — Encounter: Payer: Self-pay | Admitting: Hematology

## 2022-04-24 DIAGNOSIS — D591 Autoimmune hemolytic anemia, unspecified: Secondary | ICD-10-CM

## 2022-04-24 MED ORDER — FUROSEMIDE 20 MG PO TABS: 20.0000 mg | ORAL_TABLET | Freq: Every day | ORAL | 2 refills | Status: DC | PRN

## 2022-04-24 MED ORDER — PREDNISONE 20 MG PO TABS: ORAL_TABLET | ORAL | 1 refills | Status: DC

## 2022-04-24 NOTE — Progress Notes (Signed)
[facility] [address] [place] [address]    Clinic Day:  04/24/2022  Referring physician: Glenda Chroman, MD  Patient Care Team: Glenda Chroman, MD as PCP - General (Internal Medicine) Satira Sark, MD as PCP - Cardiology (Cardiology) Juanita Craver, MD as Consulting Physician (Gastroenterology) Satira Sark, MD as Consulting Physician (Cardiology)   ASSESSMENT & PLAN:   Assessment: 1.  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 - Rituximab weekly x4 in June 2015. - He is on prednisone 5 mg daily, most recent taper was in May 2019 from 10 mg daily. -He took 5 mg daily for a long time.  Prednisone was completely tapered off in November 2021. - Prednisone 100 mg daily started back on 03/26/2022 for relapsed AIHA   2.  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  He is on metformin and Actos.  Plan: 1.  Autoimmune hemolytic anemia: - He is currently taking prednisone 100 mg daily as his hemolytic anemia relapsed on 03/26/2022. - Reports difficulty sleeping.  Dr. Woody Seller has started him on insulin before meals as his sugars were high. - I have reviewed his labs today which showed total bilirubin improved to 1.6.  LDH is 220.  Hemoglobin improved to 15.2 from 7.7 on 03/26/2022.  Reticulocyte count is 1.8%. - Recommend decreasing prednisone to 60 mg daily for the next 2 weeks and then cut it down to 40 mg daily until he sees me back in 4 weeks. - Will plan on repeating direct Coombs test and hemolysis labs prior to next visit.  2.  Leg swellings: - He has developed bilateral leg swelling since high-dose prednisone was started. - I will start him on oral Lasix 20 mg in the mornings as needed, may titrate to 40 mg daily if needed.  Orders Placed This Encounter  Procedures   CBC with Differential/Platelet    Standing Status:   Future    Standing Expiration Date:   04/24/2023    Order Specific Question:   Release  to patient    Answer:   Immediate   Comprehensive metabolic panel    Standing Status:   Future    Standing Expiration Date:   04/24/2023    Order Specific Question:   Release to patient    Answer:   Immediate   Lactate dehydrogenase    Standing Status:   Future    Standing Expiration Date:   04/24/2023    Order Specific Question:   Release to patient    Answer:   Immediate   Reticulocytes    Standing Status:   Future    Standing Expiration Date:   04/24/2023   Direct antiglobulin test    Standing Status:   Future    Standing Expiration Date:   04/24/2023      I,Alexis Herring,acting as a scribe for Derek Jack, MD.,have documented all relevant documentation on the behalf of Derek Jack, MD,as directed by  Derek Jack, MD while in the presence of Derek Jack, MD.   I, Derek Jack MD, have reviewed the above documentation for accuracy and completeness, and I agree with the above.   Derek Jack, MD   3/13/20246:42 PM  CHIEF COMPLAINT:   Diagnosis: autoimmune hemolytic anemia   Prior Therapy:  1. High dose prednisone in 09/2012. 2. Rituximab weekly x 4 in 07/2013. 3. Prednisone taper through 12/2019.  Current Therapy:  surveillance  INTERVAL HISTORY:   [name] Page is a 77  y.o. male presenting to clinic today for follow up of autoimmune hemolytic anemia. He was last seen by me on 10/01/21.  Today, he states that he is doing well overall. His appetite level is at 100%. His energy level is at 75%. He denies any bleeding per rectum or melena.  He was reportedly started on insulin before meals.  Reports decreased sleep.  Reports leg swellings in the last few weeks.   PAST MEDICAL HISTORY:   Past Medical History: Past Medical History:  Diagnosis Date   Coronary atherosclerosis of native coronary artery    Multivessel, LVEF 59%   Essential hypertension    Hemolytic anemia (HCC)    Hyperlipidemia    Pernicious anemia      Surgical History: Past Surgical History:  Procedure Laterality Date   CORONARY ARTERY BYPASS GRAFT  2004   LIMA to LAD, SVG to OM, SVG to PDA    Social History: Social History   Socioeconomic History   Marital status: Widowed    Spouse name: Not on file   Number of children: Not on file   Years of education: Not on file   Highest education level: Not on file  Occupational History   Occupation: Full time  Tobacco Use   Smoking status: Former    Types: Cigarettes    Quit date: 02/11/2002    Years since quitting: 20.2   Smokeless tobacco: Never  Substance and Sexual Activity   Alcohol use: No    Alcohol/week: 0.0 standard drinks of alcohol   Drug use: No   Sexual activity: Not on file  Other Topics Concern   Not on file  Social History Narrative   Not on file   Social Determinants of Health   Financial Resource Strain: Low Risk  (12/20/2019)   Overall Financial Resource Strain (CARDIA)    Difficulty of Paying Living Expenses: Not hard at all  Food Insecurity: No Food Insecurity (12/20/2019)   Hunger Vital Sign    Worried About Running Out of Food in the Last Year: Never true    [place] in the Last Year: Never true  Transportation Needs: No Transportation Needs (12/20/2019)   PRAPARE - Hydrologist (Medical): No    Lack of Transportation (Non-Medical): No  Physical Activity: Inactive (12/20/2019)   Exercise Vital Sign    Days of Exercise per Week: 0 days    Minutes of Exercise per Session: 0 min  Stress: No Stress Concern Present (12/20/2019)   [place]    Feeling of Stress : Not at all  Social Connections: Moderately Integrated (12/20/2019)   Social Connection and Isolation Panel [NHANES]    Frequency of Communication with Friends and Family: More than three times a week    Frequency of Social Gatherings with Friends and Family: Once a week    Attends Religious  Services: More than 4 times per year    Active Member of [employer] or Organizations: Yes    Attends Archivist Meetings: Never    Marital Status: Widowed  Intimate Partner Violence: Not At Risk (12/20/2019)   Humiliation, Afraid, Rape, and Kick questionnaire    Fear of Current or Ex-Partner: No    Emotionally Abused: No    Physically Abused: No    Sexually Abused: No    Family History: Family History  Problem Relation Age of Onset   Dementia Mother    Cancer Mother  colon   COPD Father    Ulcerative colitis Father    Heart attack Father     Current Medications:  Current Outpatient Medications:    APPLE CIDER VINEGAR PO, Take 30 mLs by mouth daily., Disp: , Rfl:    aspirin EC 81 MG tablet, Take 162 mg by mouth daily., Disp: , Rfl:    Aspirin-Acetaminophen-Caffeine 500-325-65 MG PACK, Take by mouth daily as needed., Disp: , Rfl:    b complex vitamins tablet, Take 1 tablet by mouth daily., Disp: , Rfl:    calcium carbonate (OS-CAL) 600 MG TABS tablet, Take 600 mg by mouth 2 (two) times daily with a meal. , Disp: , Rfl:    Cranberry 500 MG CAPS, Take 2 capsules by mouth daily. , Disp: , Rfl:    FIASP FLEXTOUCH 100 UNIT/ML FlexTouch Pen, Inject 5 Units into the skin 3 (three) times daily before meals., Disp: , Rfl:    folic acid (FOLVITE) 1 MG tablet, TAKE 1 TABLET BY MOUTH ONCE DAILY., Disp: 30 tablet, Rfl: 0   furosemide (LASIX) 20 MG tablet, Take 1 tablet (20 mg total) by mouth daily as needed., Disp: 30 tablet, Rfl: 2   GARLIC OIL PO, Take 1 capsule by mouth daily., Disp: , Rfl:    levothyroxine (SYNTHROID) 75 MCG tablet, Take 75 mcg by mouth daily., Disp: , Rfl:    losartan (COZAAR) 25 MG tablet, Take 1 tablet (25 mg total) by mouth daily., Disp: 90 tablet, Rfl: 1   Magnesium 250 MG TABS, Take 1 tablet by mouth daily., Disp: , Rfl:    metFORMIN (GLUCOPHAGE) 500 MG tablet, , Disp: , Rfl:    metoprolol succinate (TOPROL-XL) 50 MG 24 hr tablet, Take 25 mg by mouth 2  (two) times daily., Disp: , Rfl:    Misc Natural Products (PROSTATE CONTROL PO), Take 1 tablet by mouth daily., Disp: , Rfl:    Multiple Vitamins-Minerals (MULTI FOR HIM 50+) TABS, Take 1 tablet by mouth daily. , Disp: , Rfl:    Omega 3-6-9 CAPS, Take 1 capsule by mouth daily. , Disp: , Rfl:    ONETOUCH VERIO test strip, , Disp: , Rfl:    pioglitazone (ACTOS) 15 MG tablet, Take 15 mg by mouth daily., Disp: , Rfl:    POTASSIUM PO, Take 99 mg by mouth daily. , Disp: , Rfl:    predniSONE (DELTASONE) 20 MG tablet, Take '60mg'$  daily for 2 weeks, then cut back to '40mg'$  daily, Disp: 60 tablet, Rfl: 1   psyllium (METAMUCIL) 58.6 % powder, Take 1 packet by mouth daily. , Disp: , Rfl:    Red Yeast Rice 600 MG TABS, Take 1 tablet by mouth daily. , Disp: , Rfl:    rosuvastatin (CRESTOR) 5 MG tablet, Take by mouth., Disp: , Rfl:    Saw Palmetto 160 MG CAPS, Take 1 capsule by mouth daily. , Disp: , Rfl:    vitamin B-12 (CYANOCOBALAMIN) 1000 MCG tablet, Take 1,000 mcg by mouth daily., Disp: , Rfl:    Allergies: No Known Allergies  REVIEW OF SYSTEMS:   Review of Systems  Constitutional:  Negative for chills, fatigue and fever.  HENT:   Negative for lump/mass, mouth sores, nosebleeds, sore throat and trouble swallowing.   Eyes:  Negative for eye problems.  Respiratory:  Positive for shortness of breath. Negative for cough.   Cardiovascular:  Positive for leg swelling. Negative for chest pain and palpitations.  Gastrointestinal:  Negative for abdominal pain, constipation, diarrhea, nausea and vomiting.  Genitourinary:  Negative for bladder incontinence, difficulty urinating, dysuria, frequency, hematuria and nocturia.   Musculoskeletal:  Negative for arthralgias, back pain, flank pain, myalgias and neck pain.  Skin:  Negative for itching and rash.  Neurological:  Negative for dizziness, headaches and numbness.  Hematological:  Does not bruise/bleed easily.  Psychiatric/Behavioral:  Positive for sleep  disturbance. Negative for depression and suicidal ideas. The patient is not nervous/anxious.   All other systems reviewed and are negative.    VITALS:   There were no vitals taken for this visit.  Wt Readings from Last 3 Encounters:  03/26/22 218 lb (98.9 kg)  11/19/21 225 lb 12.8 oz (102.4 kg)  10/01/21 229 lb (103.9 kg)    There is no height or weight on file to calculate BMI.  PHYSICAL EXAM:   Physical Exam Vitals and nursing note reviewed. Exam conducted with a chaperone present.  Constitutional:      Appearance: Normal appearance.  Cardiovascular:     Rate and Rhythm: Normal rate and regular rhythm.     Pulses: Normal pulses.     Heart sounds: Normal heart sounds.  Pulmonary:     Effort: Pulmonary effort is normal.     Breath sounds: Normal breath sounds.  Abdominal:     Palpations: Abdomen is soft. There is no hepatomegaly, splenomegaly or mass.     Tenderness: There is no abdominal tenderness.  Musculoskeletal:     Right lower leg: Edema present.     Left lower leg: Edema present.  Lymphadenopathy:     Cervical: No cervical adenopathy.     Right cervical: No superficial, deep or posterior cervical adenopathy.    Left cervical: No superficial, deep or posterior cervical adenopathy.     Upper Body:     Right upper body: No supraclavicular or axillary adenopathy.     Left upper body: No supraclavicular or axillary adenopathy.  Neurological:     General: No focal deficit present.     Mental Status: He is alert and oriented to person, place, and time.  Psychiatric:        Mood and Affect: Mood normal.        Behavior: Behavior normal.     LABS:      Latest Ref Rng & Units 04/23/2022   11:01 AM 04/09/2022   10:22 AM 04/02/2022   10:50 AM  CBC  WBC 4.0 - 10.5 K/uL 13.2  17.9  8.7   Hemoglobin 13.0 - 17.0 g/dL 15.2  12.9  10.1   Hematocrit 39.0 - 52.0 % 44.6  39.9  30.8   Platelets 150 - 400 K/uL 149  227  221       Latest Ref Rng & Units 04/23/2022   11:01  AM 04/09/2022   10:22 AM 04/02/2022   10:50 AM  CMP  Glucose 70 - 99 mg/dL 338  270  188   BUN 8 - 23 mg/dL 32  33  28   Creatinine 0.61 - 1.24 mg/dL 1.12  0.97  1.23   Sodium 135 - 145 mmol/L 133  137  136   Potassium 3.5 - 5.1 mmol/L 4.8  3.8  4.2   Chloride 98 - 111 mmol/L 97  102  102   CO2 22 - 32 mmol/L '29  24  30   '$ Calcium 8.9 - 10.3 mg/dL 8.8  8.8  8.7   Total Protein 6.5 - 8.1 g/dL 5.9  5.8  6.0   Total Bilirubin 0.3 - 1.2 mg/dL  1.6  2.0  2.1   Alkaline Phos 38 - 126 U/L 66  63  63   AST 15 - 41 U/L '24  29  27   '$ ALT 0 - 44 U/L 42  36  41      No results found for: "CEA1", "CEA" / No results found for: "CEA1", "CEA" No results found for: "PSA1" No results found for: "[id number]" No results found for: "CAN125"  Lab Results  Component Value Date   TOTALPROTELP 6.1 09/15/2015   Lab Results  Component Value Date   FERRITIN 138 03/25/2016   Lab Results  Component Value Date   LDH 220 (H) 04/23/2022   LDH 193 (H) 04/09/2022   LDH 228 (H) 04/02/2022     STUDIES:   DG Chest 2 View  Result Date: 03/27/2022 CLINICAL DATA:  Cough, chest congestion EXAM: CHEST - 2 VIEW COMPARISON:  None Available. FINDINGS: Lungs are clear. No pneumothorax or pleural effusion. Coronary artery bypass grafting has been performed. Global cardiac size within normal limits. Pulmonary vascularity is normal. No acute bone abnormality. IMPRESSION: 1. No active cardiopulmonary disease. Electronically Signed   By: Fidela Salisbury M.D.   On: 03/27/2022 22:56

## 2022-04-24 NOTE — Patient Instructions (Addendum)
[facility]  Discharge Instructions  You were seen and examined today by Dr. Delton Coombes.  Dr. Delton Coombes discussed your most recent lab work which revealed that everything looks stable.  Dr. Delton Coombes sent in furosemide for you to take in the morning as needed for the swelling.  Dr. Delton Coombes is reducing the prednisone to 60 mg to be taken once daily for two weeks and then reduce to 40 mg once daily for two weeks.  Follow-up as scheduled in 4 weeks.    Thank you for choosing [facility] to provide your oncology and hematology care.   To afford each patient quality time with our provider, please arrive at least 15 minutes before your scheduled appointment time. You may need to reschedule your appointment if you arrive late (10 or more minutes). Arriving late affects you and other patients whose appointments are after yours.  Also, if you miss three or more appointments without notifying the office, you may be dismissed from the clinic at the provider's discretion.    Again, thank you for choosing [facility]  Our hope is that these requests will decrease the amount of time that you wait before being seen by our physicians.   If you have a lab appointment with the [facility] please come in thru the Main Entrance and check in at the main information desk.           _____________________________________________________________  Should you have questions after your visit to [facility], please contact our office at [phone] and follow the prompts.  Our office hours are 8:00 a.m. to 4:30 p.m. Monday - Thursday and 8:00 a.m. to 2:30 p.m. Friday.  Please note that voicemails left after 4:00 p.m. may not be returned until the following business day.  We are closed weekends and all major holidays.  You do have access to a nurse 24-7, just call the main number to the clinic [phone] and do not press any  options, hold on the line and a nurse will answer the phone.    For prescription refill requests, have your pharmacy contact our office and allow 72 hours.    Masks are optional in the cancer centers. If you would like for your care team to wear a mask while they are taking care of you, please let them know. You may have one support person who is at least 75 years old accompany you for your appointments.

## 2022-05-01 ENCOUNTER — Other Ambulatory Visit: Payer: Self-pay | Admitting: Physician Assistant

## 2022-05-01 DIAGNOSIS — D591 Autoimmune hemolytic anemia, unspecified: Secondary | ICD-10-CM

## 2022-05-17 ENCOUNTER — Inpatient Hospital Stay: Payer: Medicare Other | Attending: Hematology

## 2022-05-17 DIAGNOSIS — Z7952 Long term (current) use of systemic steroids: Secondary | ICD-10-CM | POA: Insufficient documentation

## 2022-05-17 DIAGNOSIS — D591 Autoimmune hemolytic anemia, unspecified: Secondary | ICD-10-CM

## 2022-05-17 DIAGNOSIS — E119 Type 2 diabetes mellitus without complications: Secondary | ICD-10-CM | POA: Diagnosis not present

## 2022-05-17 DIAGNOSIS — D696 Thrombocytopenia, unspecified: Secondary | ICD-10-CM | POA: Insufficient documentation

## 2022-05-17 DIAGNOSIS — Z794 Long term (current) use of insulin: Secondary | ICD-10-CM | POA: Diagnosis not present

## 2022-05-17 DIAGNOSIS — R600 Localized edema: Secondary | ICD-10-CM | POA: Insufficient documentation

## 2022-05-17 LAB — DIRECT ANTIGLOBULIN TEST (NOT AT ARMC)
DAT, IgG: POSITIVE
DAT, complement: POSITIVE

## 2022-05-17 LAB — CBC WITH DIFFERENTIAL/PLATELET
Abs Immature Granulocytes: 0.16 10*3/uL — ABNORMAL HIGH (ref 0.00–0.07)
Basophils Absolute: 0 10*3/uL (ref 0.0–0.1)
Basophils Relative: 0 %
Eosinophils Absolute: 0 10*3/uL (ref 0.0–0.5)
Eosinophils Relative: 0 %
HCT: 40.6 % (ref 39.0–52.0)
Hemoglobin: 13.6 g/dL (ref 13.0–17.0)
Immature Granulocytes: 1 %
Lymphocytes Relative: 6 %
Lymphs Abs: 0.7 10*3/uL (ref 0.7–4.0)
MCH: 32 pg (ref 26.0–34.0)
MCHC: 33.5 g/dL (ref 30.0–36.0)
MCV: 95.5 fL (ref 80.0–100.0)
Monocytes Absolute: 0.4 10*3/uL (ref 0.1–1.0)
Monocytes Relative: 4 %
Neutro Abs: 11.2 10*3/uL — ABNORMAL HIGH (ref 1.7–7.7)
Neutrophils Relative %: 89 %
Platelets: 126 10*3/uL — ABNORMAL LOW (ref 150–400)
RBC: 4.25 MIL/uL (ref 4.22–5.81)
RDW: 13.8 % (ref 11.5–15.5)
WBC: 12.6 10*3/uL — ABNORMAL HIGH (ref 4.0–10.5)
nRBC: 0 % (ref 0.0–0.2)

## 2022-05-17 LAB — COMPREHENSIVE METABOLIC PANEL
ALT: 41 U/L (ref 0–44)
AST: 20 U/L (ref 15–41)
Albumin: 3 g/dL — ABNORMAL LOW (ref 3.5–5.0)
Alkaline Phosphatase: 60 U/L (ref 38–126)
Anion gap: 8 (ref 5–15)
BUN: 41 mg/dL — ABNORMAL HIGH (ref 8–23)
CO2: 24 mmol/L (ref 22–32)
Calcium: 8.9 mg/dL (ref 8.9–10.3)
Chloride: 101 mmol/L (ref 98–111)
Creatinine, Ser: 1.05 mg/dL (ref 0.61–1.24)
GFR, Estimated: >60 mL/min (ref >60)
Glucose, Bld: 238 mg/dL — ABNORMAL HIGH (ref 70–99)
Potassium: 4.4 mmol/L (ref 3.5–5.1)
Sodium: 133 mmol/L — ABNORMAL LOW (ref 135–145)
Total Bilirubin: 1.2 mg/dL (ref 0.3–1.2)
Total Protein: 5.5 g/dL — ABNORMAL LOW (ref 6.5–8.1)

## 2022-05-17 LAB — RETICULOCYTES
Immature Retic Fract: 21.3 % — ABNORMAL HIGH (ref 2.3–15.9)
RBC.: 4.3 MIL/uL (ref 4.22–5.81)
Retic Count, Absolute: 107.1 10*3/uL (ref 19.0–186.0)
Retic Ct Pct: 2.5 % (ref 0.4–3.1)

## 2022-05-17 LAB — LACTATE DEHYDROGENASE: LDH: 274 U/L — ABNORMAL HIGH (ref 98–192)

## 2022-05-22 ENCOUNTER — Other Ambulatory Visit: Payer: Self-pay

## 2022-05-22 ENCOUNTER — Inpatient Hospital Stay (HOSPITAL_BASED_OUTPATIENT_CLINIC_OR_DEPARTMENT_OTHER): Payer: Medicare Other | Admitting: Physician Assistant

## 2022-05-22 VITALS — BP 147/76 | HR 70 | Resp 16 | Wt 211.6 lb

## 2022-05-22 DIAGNOSIS — D591 Autoimmune hemolytic anemia, unspecified: Secondary | ICD-10-CM

## 2022-05-22 MED ORDER — PREDNISONE 20 MG PO TABS: 20.0000 mg | ORAL_TABLET | Freq: Every day | ORAL | 1 refills | Status: DC

## 2022-05-22 NOTE — Patient Instructions (Signed)
[facility] at [facility] **VISIT SUMMARY & IMPORTANT INSTRUCTIONS **   You were seen today by Rojelio Brenner PA-C for your autoimmune hemolytic anemia.   Your labs show improved hemoglobin. We will decrease your prednisone to 2 mg daily We will recheck labs in 4 weeks  FOLLOW-UP APPOINTMENT: Office visit with Dr. Ellin Saba in 4 weeks.  ** Thank you for trusting me with your healthcare!  I strive to provide all of my patients with quality care at each visit.  If you receive a survey for this visit, I would be so grateful to you for taking the time to provide feedback.  Thank you in advance!  ~ Teagen Mcleary                   Dr. Doreatha Massed   &   Rojelio Brenner, PA-C   - - - - - - - - - - - - - - - - - -    Thank you for choosing [facility] at [facility] to provide your oncology and hematology care.  To afford each patient quality time with our provider, please arrive at least 15 minutes before your scheduled appointment time.   If you have a lab appointment with the Cancer Center please come in thru the Main Entrance and check in at the main information desk.  You need to re-schedule your appointment should you arrive 10 or more minutes late.  We strive to give you quality time with our providers, and arriving late affects you and other patients whose appointments are after yours.  Also, if you no show three or more times for appointments you may be dismissed from the clinic at the providers discretion.     Again, thank you for choosing [facility].  Our hope is that these requests will decrease the amount of time that you wait before being seen by our physicians.       _____________________________________________________________  Should you have questions after your visit to [facility], please contact our office at [phone] and follow the prompts.  Our office hours are 8:00 a.m. and 4:30 p.m. Monday  - Friday.  Please note that voicemails left after 4:00 p.m. may not be returned until the following business day.  We are closed weekends and major holidays.  You do have access to a nurse 24-7, just call the main number to the clinic [phone] and do not press any options, hold on the line and a nurse will answer the phone.    For prescription refill requests, have your pharmacy contact our office and allow 72 hours.

## 2022-05-22 NOTE — Progress Notes (Signed)
[facility] [address], [place] [address]   CLINIC:  Medical Oncology/Hematology  PCP:  [name], MD [address] [place] [address] [phone]   REASON FOR VISIT:  Follow-up for autoimmune hemolytic anemia  PRIOR THERAPY:  1. High dose prednisone in 09/2012. 2. Rituximab weekly x 4 in 07/2013. 3. Prednisone taper through 12/2019.  CURRENT THERAPY: Surveillance  INTERVAL HISTORY:   [name] 77 y.o. male returns for routine follow-up of his autoimmune hemolytic anemia.  He was last seen in clinic by Dr. Ellin Saba on 04/24/2022.  At last visit with Dr. Ellin Saba, patient was tapered down on his prednisone (100 mg daily), down to 60 mg daily x 2 weeks, then 40 mg daily (current dose).  He reports increased blood sugar since starting steroids, on insulin per his PCP.  He had severe peripheral edema extending to his thighs, which has improved after starting Lasix.  Right leg is more swollen than left leg, but this is chronic due to vein harvesting for CABG many years ago.  His energy has improved and his breathing has returned to baseline.  Reports resolution of jaundice and pallor.  No chest pain, lightheadedness, or syncope.  He has 100% energy and 100% appetite. He endorses that he is maintaining a stable weight.  ASSESSMENT & PLAN:  1.  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 - Rituximab weekly x4 in June 2015. - He took prednisone 5 mg daily for approximately 8 years.  Prednisone was completely tapered off in November 2021. - He has had mild baseline hemolysis since prednisone was discontinued. - He takes daily folic acid supplement - Exacerbation of AIHA noted on 03/26/2022, evidenced by Hgb 7.5/MCV 125.4, bilirubin 3.3, LDH 433, reticulocytes 14.5%,  haptoglobin undetectable.  Possibly triggered by URI. - Prednisone 100 mg daily started on 03/26/2022, tapered to 60 mg daily on 04/24/2022, tapered to 40 mg daily on 05/08/2022 (current dose 40 mg daily) - Most recent labs (05/17/2022): Hgb 13.6/MCV 95.5, WBC 12.6/ANC 11.2, platelets 126 Bilirubin 1.2.  LDH 274. Reticulocytes 2.5% (normal) DAT complement and IgG are positive with warm autoantibodies - Energy improved.  Breathing at baseline.  Jaundice resolved. - PLAN: Decrease prednisone to 20 mg daily. - Labs in 1 month = CBC/D, CMP, LDH, reticulocytes, DAT -- Attention to mild thrombocytopenia at follow-up - MD office visit in 1 month (alternating MD/APP visits)  2.  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  He is on insulin, metformin and Actos.  3.  Leg swellings: - He has developed bilateral leg swelling since high-dose prednisone was started.  Right leg is more swollen than left leg, but this is chronic due to vein harvesting for CABG many years ago. - Improved after starting Lasix. - PLAN: Continue Lasix as needed  PLAN SUMMARY: >> Labs in 1 month = CBC/D, CMP, LDH, reticulocytes, DAT >> MD visit in 1 month   **ALTERNATING MD/APP visits    **NOTE:  Patient exhibits hesitance regarding blood transfusions due to conversation he had with his previous hematologist at [facility].  Offered reassurance that per ASH guidelines, transfusion is indicated in AIHA if Hgb <7.0 as a temporary measure to provide adequate blood support until steroids take effect.  This was discussed with Dr. Ellin Saba, who also agrees, especially since patient has history of CAD.  Per ASH recommendations for transfusion support in patients with AIHA: "Generally, the decision to transfuse AIHA patients should be based on similar indications as for anemic patients without AIHA. In asymptomatic patients, a restrictive transfusion strategy (ie, Hgb <7?g/dL) should be practiced.21  The use of bed rest and supplemental oxygen in  patients with severe anemia (ie, Hgb <6?g/dL) could help defer transfusion until testing is complete. The patient's clinical status, comorbidities, and symptoms should guide the decision to transfuse regardless of Hgb level and compatibility test results. When symptoms of significant hypoxia, confusion, angina, or hemodynamic instability are present, RBC transfusion should not be withheld, even when testing is incomplete. The presence of reticulocytopenia in an AIHA patient with severe anemia should be regarded as a medical emergency. Reticulocytopenia is a poor prognostic indicator representing an inadequate erythropoietic response and higher transfusion requirements.16,22   When transfusion is medically necessary, the patient's physician should be assured that transfused RBCS are unlikely to cause an acute hemolytic transfusion reaction. The risk for a transfusion reaction in patients with AIHA appears to be no higher than in other patients requiring transfusion. Chen et?al.23  found that reactions occurred in 1.6% of 450 transfused AIHA patients, with febrile and allergic reactions the most frequent and no reports of hemolytic adverse events. Most patients with AIHA tolerate serologically incompatible RBC transfusions quite well, without a significant increase in their underlying hemolysis."  SOURCE:  American Society of Hematology (2022) - CheckAnalysis.de.[id number]     REVIEW OF SYSTEMS:   Review of Systems  Constitutional:  Negative for appetite change, chills, diaphoresis, fatigue, fever and unexpected weight change.  HENT:   Negative for lump/mass and nosebleeds.   Eyes:  Negative for eye problems.  Respiratory:  Negative for cough, hemoptysis and shortness of breath.   Cardiovascular:  Positive for leg swelling. Negative for chest pain and palpitations.  Gastrointestinal:  Negative for abdominal pain, blood in stool, constipation, diarrhea, nausea and vomiting.   Genitourinary:  Positive for frequency. Negative for hematuria.   Skin: Negative.   Neurological:  Positive for numbness. Negative for dizziness, headaches and light-headedness.  Hematological:  Does not bruise/bleed easily.  Psychiatric/Behavioral:  Positive for sleep disturbance.      PHYSICAL EXAM:  ECOG PERFORMANCE STATUS: 1 - Symptomatic but completely ambulatory  There were no vitals filed for this visit. There were no vitals filed for this visit. Physical Exam Constitutional:      Appearance: Normal appearance. He is obese.  HENT:     Head: Normocephalic and atraumatic.     Mouth/Throat:     Mouth: Mucous membranes are moist.  Eyes:     General: No scleral icterus.    Extraocular Movements: Extraocular movements intact.     Pupils: Pupils are equal, round, and reactive to light.  Cardiovascular:     Rate and Rhythm: Normal rate and regular rhythm.     Pulses: Normal pulses.     Heart sounds: Normal heart sounds.  Pulmonary:     Effort: Pulmonary effort is normal.     Breath sounds: Decreased breath sounds present.  Abdominal:     General: Bowel sounds are normal.     Palpations: Abdomen is soft.     Tenderness: There is no abdominal tenderness.  Musculoskeletal:        General: No swelling.     Right lower leg: No edema.     Left lower leg: No edema.  Lymphadenopathy:     Cervical: No cervical adenopathy.  Skin:  General: Skin is warm and dry.     Coloration: Skin is not jaundiced or pale.  Neurological:     General: No focal deficit present.     Mental Status: He is alert and oriented to person, place, and time.  Psychiatric:        Mood and Affect: Mood normal.        Behavior: Behavior normal.    PAST MEDICAL/SURGICAL HISTORY:  Past Medical History:  Diagnosis Date   Coronary atherosclerosis of native coronary artery    Multivessel, LVEF 59%   Essential hypertension    Hemolytic anemia (HCC)    Hyperlipidemia    Pernicious anemia    Past  Surgical History:  Procedure Laterality Date   CORONARY ARTERY BYPASS GRAFT  2004   LIMA to LAD, SVG to OM, SVG to PDA    SOCIAL HISTORY:  Social History   Socioeconomic History   Marital status: Widowed    Spouse name: Not on file   Number of children: Not on file   Years of education: Not on file   Highest education level: Not on file  Occupational History   Occupation: Full time  Tobacco Use   Smoking status: Former    Types: Cigarettes    Quit date: 02/11/2002    Years since quitting: 20.2   Smokeless tobacco: Never  Substance and Sexual Activity   Alcohol use: No    Alcohol/week: 0.0 standard drinks of alcohol   Drug use: No   Sexual activity: Not on file  Other Topics Concern   Not on file  Social History Narrative   Not on file   Social Determinants of Health   Financial Resource Strain: Low Risk  (12/20/2019)   Overall Financial Resource Strain (CARDIA)    Difficulty of Paying Living Expenses: Not hard at all  Food Insecurity: No Food Insecurity (12/20/2019)   Hunger Vital Sign    Worried About Running Out of Food in the Last Year: Never true    Ran Out of Food in the Last Year: Never true  Transportation Needs: No Transportation Needs (12/20/2019)   PRAPARE - Administrator, Civil Service (Medical): No    Lack of Transportation (Non-Medical): No  Physical Activity: Inactive (12/20/2019)   Exercise Vital Sign    Days of Exercise per Week: 0 days    Minutes of Exercise per Session: 0 min  Stress: No Stress Concern Present (12/20/2019)   [employer] of Occupational Health - Occupational Stress Questionnaire    Feeling of Stress : Not at all  Social Connections: Moderately Integrated (12/20/2019)   Social Connection and Isolation Panel [NHANES]    Frequency of Communication with Friends and Family: More than three times a week    Frequency of Social Gatherings with Friends and Family: Once a week    Attends Religious Services: More than 4 times  per year    Active Member of [employer] or Organizations: Yes    Attends Banker Meetings: Never    Marital Status: Widowed  Intimate Partner Violence: Not At Risk (12/20/2019)   Humiliation, Afraid, Rape, and Kick questionnaire    Fear of Current or Ex-Partner: No    Emotionally Abused: No    Physically Abused: No    Sexually Abused: No    FAMILY HISTORY:  Family History  Problem Relation Age of Onset   Dementia Mother    Cancer Mother        colon   COPD  Father    Ulcerative colitis Father    Heart attack Father     CURRENT MEDICATIONS:  Outpatient Encounter Medications as of 05/22/2022  Medication Sig Note   APPLE CIDER VINEGAR PO Take 30 mLs by mouth daily.    aspirin EC 81 MG tablet Take 162 mg by mouth daily.    Aspirin-Acetaminophen-Caffeine 500-325-65 MG PACK Take by mouth daily as needed. 09/01/2015: Received from: [facility]   b complex vitamins tablet Take 1 tablet by mouth daily.    calcium carbonate (OS-CAL) 600 MG TABS tablet Take 600 mg by mouth 2 (two) times daily with a meal.     Cranberry 500 MG CAPS Take 2 capsules by mouth daily.  09/01/2015: Received from: [facility]   FIASP FLEXTOUCH 100 UNIT/ML FlexTouch Pen Inject 5 Units into the skin 3 (three) times daily before meals.    folic acid (FOLVITE) 1 MG tablet TAKE 1 TABLET BY MOUTH ONCE DAILY.    furosemide (LASIX) 20 MG tablet Take 1 tablet (20 mg total) by mouth daily as needed.    GARLIC OIL PO Take 1 capsule by mouth daily.    levothyroxine (SYNTHROID) 75 MCG tablet Take 75 mcg by mouth daily.    losartan (COZAAR) 25 MG tablet Take 1 tablet (25 mg total) by mouth daily.    Magnesium 250 MG TABS Take 1 tablet by mouth daily.    metFORMIN (GLUCOPHAGE) 500 MG tablet     metoprolol succinate (TOPROL-XL) 50 MG 24 hr tablet Take 25 mg by mouth 2 (two) times daily.    Misc Natural Products (PROSTATE CONTROL PO) Take 1 tablet by mouth daily.    Multiple Vitamins-Minerals (MULTI FOR  HIM 50+) TABS Take 1 tablet by mouth daily.  09/01/2015: Received from: [facility]   Omega 3-6-9 CAPS Take 1 capsule by mouth daily.  09/01/2015: Received from: [facility]   ONETOUCH VERIO test strip     pioglitazone (ACTOS) 15 MG tablet Take 15 mg by mouth daily.    POTASSIUM PO Take 99 mg by mouth daily.     predniSONE (DELTASONE) 20 MG tablet Take 60mg  daily for 2 weeks, then cut back to 40mg  daily    psyllium (METAMUCIL) 58.6 % powder Take 1 packet by mouth daily.     Red Yeast Rice 600 MG TABS Take 1 tablet by mouth daily.  09/01/2015: Received from: [facility]   rosuvastatin (CRESTOR) 5 MG tablet Take by mouth.    Saw Palmetto 160 MG CAPS Take 1 capsule by mouth daily.  09/01/2015: Received from: [facility]   vitamin B-12 (CYANOCOBALAMIN) 1000 MCG tablet Take 1,000 mcg by mouth daily.    No facility-administered encounter medications on file as of 05/22/2022.    ALLERGIES:  No Known Allergies  LABORATORY DATA:  I have reviewed the labs as listed.  CBC    Component Value Date/Time   WBC 12.6 (H) 05/17/2022 1348   RBC 4.25 05/17/2022 1348   RBC 4.30 05/17/2022 1348   HGB 13.6 05/17/2022 1348   HCT 40.6 05/17/2022 1348   PLT 126 (L) 05/17/2022 1348   MCV 95.5 05/17/2022 1348   MCH 32.0 05/17/2022 1348   MCHC 33.5 05/17/2022 1348   RDW 13.8 05/17/2022 1348   LYMPHSABS 0.7 05/17/2022 1348   MONOABS 0.4 05/17/2022 1348   EOSABS 0.0 05/17/2022 1348   BASOSABS 0.0 05/17/2022 1348      Latest Ref Rng & Units 05/17/2022    1:48  PM 04/23/2022   11:01 AM 04/09/2022   10:22 AM  CMP  Glucose 70 - 99 mg/dL 454238  098338  119270   BUN 8 - 23 mg/dL 41  32  33   Creatinine 0.61 - 1.24 mg/dL 1.471.05  8.291.12  5.620.97   Sodium 135 - 145 mmol/L 133  133  137   Potassium 3.5 - 5.1 mmol/L 4.4  4.8  3.8   Chloride 98 - 111 mmol/L 101  97  102   CO2 22 - 32 mmol/L 24  29  24    Calcium 8.9 - 10.3 mg/dL 8.9  8.8  8.8   Total Protein 6.5 - 8.1 g/dL 5.5  5.9  5.8   Total Bilirubin 0.3  - 1.2 mg/dL 1.2  1.6  2.0   Alkaline Phos 38 - 126 U/L 60  66  63   AST 15 - 41 U/L 20  24  29    ALT 0 - 44 U/L 41  42  36     DIAGNOSTIC IMAGING:  I have independently reviewed the relevant imaging and discussed with the patient.   WRAP UP:  All questions were answered. The patient knows to call the clinic with any problems, questions or concerns.  Medical decision making: Moderate  Time spent on visit: I spent 20 minutes counseling the patient face to face. The total time spent in the appointment was 30 minutes and more than 50% was on counseling.  Carnella GuadalajaraRebekah M Celvin Taney, PA-C  05/22/22 9:40 AM

## 2022-06-04 ENCOUNTER — Other Ambulatory Visit: Payer: Self-pay | Admitting: Hematology

## 2022-06-04 DIAGNOSIS — D591 Autoimmune hemolytic anemia, unspecified: Secondary | ICD-10-CM

## 2022-06-09 NOTE — Progress Notes (Unsigned)
    Cardiology Office Note  Date: 06/10/2022   ID: [name], [name] 07-29-1945, MRN [id number]  History of Present Illness: [name] Page is a 77 y.o. male last seen in October 2023.  He is here for a routine visit.  Reports no angina, NYHA class II dyspnea with typical activities.  Still working full-time.  He has had a recurrence of autoimmune hemolytic anemia and is back on steroids with follow-up in the hematology clinic.  This has affected his stamina, he is also having recurring leg edema on prednisone and taking Lasix daily with additional doses as needed.  Not on potassium supplement at this time.  I did review his lab work showing normal renal function and potassium most recently.  Blood sugars have also been elevated and his type 2 diabetes mellitus regimen has been adjusted by PCP.  I reviewed his lab work from April.  Lipids remain well-controlled, LDL 57 on Crestor.  Blood pressure has also been elevated, systolics 140s to 160s in general.  We discussed increasing Cozaar to 50 mg daily.  Physical Exam: VS:  BP (!) 162/78   Pulse 78   Ht 6\' 1"  (1.854 m)   Wt 211 lb 6.4 oz (95.9 kg)   SpO2 96%   BMI 27.89 kg/m , BMI Body mass index is 27.89 kg/m.  Wt Readings from Last 3 Encounters:  06/10/22 211 lb 6.4 oz (95.9 kg)  05/22/22 211 lb 10.3 oz (96 kg)  03/26/22 218 lb (98.9 kg)    General: Patient appears comfortable at rest. HEENT: Conjunctiva and lids normal. Neck: Supple, no elevated JVP or carotid bruits. Lungs: Clear to auscultation, nonlabored breathing at rest. Cardiac: Regular rate and rhythm, no S3 or significant systolic murmur. Extremities: 2+ lower leg edema..  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  Labwork: February 2024: Cholesterol 116, triglycerides 102, HDL 40, LDL 57, TSH 7.64 05/17/2022: ALT 41; AST 20; BUN 41; Creatinine, Ser 1.05; Hemoglobin 13.6;  Platelets 126; Potassium 4.4; Sodium 133   Other Studies Reviewed Today:  No interval cardiac testing for review today.  Assessment and Plan:  1.  Multivessel CAD status post CABG in 2004 with LIMA to LAD, SVG to OM, and SVG to PDA.  Lexiscan Myoview in June 2020 revealed evidence of inferior/inferoseptal scar but no active ischemia and LVEF 58%.  No angina at this time on medical therapy.  Continue aspirin, Toprol-XL, Cozaar, and Crestor.  2.  Essential hypertension.  Increase Cozaar to 50 mg daily.  3.  Mixed hyperlipidemia on Crestor.  LDL 57 in February.  He remains on Crestor 5 mg daily.  4.  Autoimmune hemolytic anemia, following with hematology and back on steroids.  Unfortunately type 2 diabetes mellitus and hypertension are less well-controlled in this setting.  He is having lower leg edema as well, but responding to Lasix.  Continue to track lab work through PCP and hematology, may ultimately need a potassium supplement.  Disposition:  Follow up  6 months.  Signed, Jonelle Sidle, M.D., F.A.C.C. [facility]

## 2022-06-10 ENCOUNTER — Ambulatory Visit: Payer: Medicare Other | Attending: Cardiology | Admitting: Cardiology

## 2022-06-10 ENCOUNTER — Encounter: Payer: Self-pay | Admitting: Cardiology

## 2022-06-10 VITALS — BP 162/78 | HR 78 | Ht 73.0 in | Wt 211.4 lb

## 2022-06-10 DIAGNOSIS — I10 Essential (primary) hypertension: Secondary | ICD-10-CM | POA: Diagnosis not present

## 2022-06-10 DIAGNOSIS — E782 Mixed hyperlipidemia: Secondary | ICD-10-CM | POA: Diagnosis not present

## 2022-06-10 DIAGNOSIS — I25119 Atherosclerotic heart disease of native coronary artery with unspecified angina pectoris: Secondary | ICD-10-CM

## 2022-06-10 MED ORDER — LOSARTAN POTASSIUM 50 MG PO TABS: 50.0000 mg | ORAL_TABLET | Freq: Every day | ORAL | 1 refills | Status: DC

## 2022-06-10 NOTE — Patient Instructions (Addendum)
Medication Instructions:  Your physician has recommended you make the following change in your medication:  Increase losartan to 50 mg daily Continue other medications the same  Labwork: none  Testing/Procedures: none  Follow-Up: Your physician recommends that you schedule a follow-up appointment in: 6 months  Any Other Special Instructions Will Be Listed Below (If Applicable).  If you need a refill on your cardiac medications before your next appointment, please call your pharmacy. 

## 2022-06-17 ENCOUNTER — Inpatient Hospital Stay: Payer: Medicare Other | Attending: Physician Assistant

## 2022-06-17 DIAGNOSIS — Z7984 Long term (current) use of oral hypoglycemic drugs: Secondary | ICD-10-CM | POA: Diagnosis not present

## 2022-06-17 DIAGNOSIS — D591 Autoimmune hemolytic anemia, unspecified: Secondary | ICD-10-CM | POA: Diagnosis present

## 2022-06-17 DIAGNOSIS — Z7982 Long term (current) use of aspirin: Secondary | ICD-10-CM | POA: Diagnosis not present

## 2022-06-17 DIAGNOSIS — Z79899 Other long term (current) drug therapy: Secondary | ICD-10-CM | POA: Insufficient documentation

## 2022-06-17 DIAGNOSIS — E119 Type 2 diabetes mellitus without complications: Secondary | ICD-10-CM | POA: Insufficient documentation

## 2022-06-17 DIAGNOSIS — Z7952 Long term (current) use of systemic steroids: Secondary | ICD-10-CM | POA: Insufficient documentation

## 2022-06-17 DIAGNOSIS — Z87891 Personal history of nicotine dependence: Secondary | ICD-10-CM | POA: Insufficient documentation

## 2022-06-17 DIAGNOSIS — M7989 Other specified soft tissue disorders: Secondary | ICD-10-CM | POA: Insufficient documentation

## 2022-06-17 LAB — COMPREHENSIVE METABOLIC PANEL
ALT: 35 U/L (ref 0–44)
AST: 20 U/L (ref 15–41)
Albumin: 3.7 g/dL (ref 3.5–5.0)
Alkaline Phosphatase: 61 U/L (ref 38–126)
Anion gap: 7 (ref 5–15)
BUN: 30 mg/dL — ABNORMAL HIGH (ref 8–23)
CO2: 30 mmol/L (ref 22–32)
Calcium: 9.5 mg/dL (ref 8.9–10.3)
Chloride: 99 mmol/L (ref 98–111)
Creatinine, Ser: 1.05 mg/dL (ref 0.61–1.24)
GFR, Estimated: >60 mL/min (ref >60)
Glucose, Bld: 182 mg/dL — ABNORMAL HIGH (ref 70–99)
Potassium: 4.2 mmol/L (ref 3.5–5.1)
Sodium: 136 mmol/L (ref 135–145)
Total Bilirubin: 1.3 mg/dL — ABNORMAL HIGH (ref 0.3–1.2)
Total Protein: 6.9 g/dL (ref 6.5–8.1)

## 2022-06-17 LAB — CBC WITH DIFFERENTIAL/PLATELET
Abs Immature Granulocytes: 0.17 10*3/uL — ABNORMAL HIGH (ref 0.00–0.07)
Basophils Absolute: 0.1 10*3/uL (ref 0.0–0.1)
Basophils Relative: 1 %
Eosinophils Absolute: 0.1 10*3/uL (ref 0.0–0.5)
Eosinophils Relative: 1 %
HCT: 45.1 % (ref 39.0–52.0)
Hemoglobin: 14.4 g/dL (ref 13.0–17.0)
Immature Granulocytes: 2 %
Lymphocytes Relative: 19 %
Lymphs Abs: 2.1 10*3/uL (ref 0.7–4.0)
MCH: 30.8 pg (ref 26.0–34.0)
MCHC: 31.9 g/dL (ref 30.0–36.0)
MCV: 96.6 fL (ref 80.0–100.0)
Monocytes Absolute: 0.8 10*3/uL (ref 0.1–1.0)
Monocytes Relative: 7 %
Neutro Abs: 7.8 10*3/uL — ABNORMAL HIGH (ref 1.7–7.7)
Neutrophils Relative %: 70 %
Platelets: 312 10*3/uL (ref 150–400)
RBC: 4.67 MIL/uL (ref 4.22–5.81)
RDW: 16.4 % — ABNORMAL HIGH (ref 11.5–15.5)
WBC: 11 10*3/uL — ABNORMAL HIGH (ref 4.0–10.5)
nRBC: 0 % (ref 0.0–0.2)

## 2022-06-17 LAB — RETICULOCYTES
Immature Retic Fract: 26.4 % — ABNORMAL HIGH (ref 2.3–15.9)
RBC.: 4.62 MIL/uL (ref 4.22–5.81)
Retic Count, Absolute: 269.8 10*3/uL — ABNORMAL HIGH (ref 19.0–186.0)
Retic Ct Pct: 5.8 % — ABNORMAL HIGH (ref 0.4–3.1)

## 2022-06-17 LAB — DIRECT ANTIGLOBULIN TEST (NOT AT ARMC)
DAT, IgG: POSITIVE
DAT, complement: POSITIVE

## 2022-06-17 LAB — LACTATE DEHYDROGENASE: LDH: 266 U/L — ABNORMAL HIGH (ref 98–192)

## 2022-06-23 NOTE — Progress Notes (Signed)
[facility] [address] [place] [address]    Clinic Day:  06/24/2022  Referring physician: [name], MD  Patient Care Team: [name], MD as PCP - General (Internal Medicine) Jonelle Sidle, MD as PCP - Cardiology (Cardiology) Charna Elizabeth, MD as Consulting Physician (Gastroenterology) Jonelle Sidle, MD as Consulting Physician (Cardiology)   ASSESSMENT & PLAN:   Assessment: 1.  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 - Rituximab weekly x4 in June 2015. - He is on prednisone 5 mg daily, most recent taper was in May 2019 from 10 mg daily. -He took 5 mg daily for a long time.  Prednisone was completely tapered off in November 2021. 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  He is on metformin and Actos.    Plan: 1.  Autoimmune hemolytic anemia: - He is currently taking prednisone 20 mg daily and tolerating well. - Reviewed labs from 06/17/2022: Total bilirubin 1.3.  LDH is 266 but hemoglobin is normal at 14.4.  Reticulocyte count is high at 5.8%.  DAT IgG and complement was positive. - I would continue same dose of prednisone 20 mg daily. - RTC 6 weeks with repeat labs.  2.  Leg swellings: - Continue Lasix 20 mg daily.  May increase Lasix to 40 mg daily if needed.  No orders of the defined types were placed in this encounter.     I,Katie Daubenspeck,acting as a Neurosurgeon for Doreatha Massed, MD.,have documented all relevant documentation on the behalf of Doreatha Massed, MD,as directed by  Doreatha Massed, MD while in the presence of Doreatha Massed, MD.   I, Doreatha Massed MD, have reviewed the above documentation for accuracy and completeness, and I agree with the above.   Doreatha Massed, MD   5/13/202412:17 PM  CHIEF COMPLAINT:   Diagnosis: autoimmune hemolytic anemia    Cancer Staging  No matching staging information was found for the patient.   Prior Therapy: 1. High dose prednisone in 09/2012. 2. Rituximab weekly x 4 in 07/2013. 3. Prednisone taper through 12/2019.  Current Therapy:  surveillance    HISTORY OF PRESENT ILLNESS:   Oncology History   No history exists.     INTERVAL HISTORY:   [name] Page is a 77 y.o. male presenting to clinic today for follow up of autoimmune hemolytic anemia. He was last seen by PA Rebekah on 05/22/22.  He reportedly fell couple of weeks ago while he was attending a baseball game and missed a step on the sidewalk.  Today, he states that he is doing well overall. His appetite level is at 100%. His energy level is at 100%.  PAST MEDICAL HISTORY:   Past Medical History: Past Medical History:  Diagnosis Date   Coronary atherosclerosis of native coronary artery    Multivessel, LVEF 59%   Essential hypertension    Hemolytic anemia (HCC)    Hyperlipidemia    Pernicious anemia     Surgical History: Past Surgical History:  Procedure Laterality Date   CORONARY ARTERY BYPASS GRAFT  2004   LIMA to LAD, SVG to OM, SVG to PDA    Social History: Social History   Socioeconomic History   Marital status: Widowed    Spouse name: Not on file  Number of children: Not on file   Years of education: Not on file   Highest education level: Not on file  Occupational History   Occupation: Full time  Tobacco Use   Smoking status: Former    Types: Cigarettes    Quit date: 02/11/2002    Years since quitting: 20.3   Smokeless tobacco: Never  Substance and Sexual Activity   Alcohol use: No    Alcohol/week: 0.0 standard drinks of alcohol   Drug use: No   Sexual activity: Not on file  Other Topics Concern   Not on file  Social History Narrative   Not on file   Social Determinants of Health   Financial Resource Strain:  Low Risk  (12/20/2019)   Overall Financial Resource Strain (CARDIA)    Difficulty of Paying Living Expenses: Not hard at all  Food Insecurity: No Food Insecurity (12/20/2019)   Hunger Vital Sign    Worried About Running Out of Food in the Last Year: Never true    Ran Out of Food in the Last Year: Never true  Transportation Needs: No Transportation Needs (12/20/2019)   PRAPARE - Administrator, Civil Service (Medical): No    Lack of Transportation (Non-Medical): No  Physical Activity: Inactive (12/20/2019)   Exercise Vital Sign    Days of Exercise per Week: 0 days    Minutes of Exercise per Session: 0 min  Stress: No Stress Concern Present (12/20/2019)   [employer] of Occupational Health - Occupational Stress Questionnaire    Feeling of Stress : Not at all  Social Connections: Moderately Integrated (12/20/2019)   Social Connection and Isolation Panel [NHANES]    Frequency of Communication with Friends and Family: More than three times a week    Frequency of Social Gatherings with Friends and Family: Once a week    Attends Religious Services: More than 4 times per year    Active Member of [employer] or Organizations: Yes    Attends Banker Meetings: Never    Marital Status: Widowed  Intimate Partner Violence: Not At Risk (12/20/2019)   Humiliation, Afraid, Rape, and Kick questionnaire    Fear of Current or Ex-Partner: No    Emotionally Abused: No    Physically Abused: No    Sexually Abused: No    Family History: Family History  Problem Relation Age of Onset   Dementia Mother    Cancer Mother        colon   COPD Father    Ulcerative colitis Father    Heart attack Father     Current Medications:  Current Outpatient Medications:    APPLE CIDER VINEGAR PO, Take 30 mLs by mouth daily., Disp: , Rfl:    aspirin EC 81 MG tablet, Take 162 mg by mouth daily., Disp: , Rfl:    Aspirin-Acetaminophen-Caffeine 500-325-65 MG PACK, Take by mouth daily as needed.,  Disp: , Rfl:    b complex vitamins tablet, Take 1 tablet by mouth daily., Disp: , Rfl:    calcium carbonate (OS-CAL) 600 MG TABS tablet, Take 600 mg by mouth 2 (two) times daily with a meal. , Disp: , Rfl:    Cranberry 500 MG CAPS, Take 2 capsules by mouth daily. , Disp: , Rfl:    FIASP FLEXTOUCH 100 UNIT/ML FlexTouch Pen, Inject 5 Units into the skin 3 (three) times daily before meals., Disp: , Rfl:    folic acid (FOLVITE) 1 MG tablet, take 1 tablet by mouth once daily.,  Disp: 30 tablet, Rfl: 0   furosemide (LASIX) 40 MG tablet, Take 40 mg by mouth daily., Disp: , Rfl:    GARLIC OIL PO, Take 1 capsule by mouth daily., Disp: , Rfl:    levothyroxine (SYNTHROID) 75 MCG tablet, Take 75 mcg by mouth daily., Disp: , Rfl:    losartan (COZAAR) 50 MG tablet, Take 1 tablet (50 mg total) by mouth daily., Disp: 90 tablet, Rfl: 1   Magnesium 250 MG TABS, Take 1 tablet by mouth daily., Disp: , Rfl:    metFORMIN (GLUCOPHAGE) 500 MG tablet, , Disp: , Rfl:    metoprolol succinate (TOPROL-XL) 50 MG 24 hr tablet, Take 25 mg by mouth 2 (two) times daily., Disp: , Rfl:    Misc Natural Products (PROSTATE CONTROL PO), Take 1 tablet by mouth daily., Disp: , Rfl:    Multiple Vitamins-Minerals (MULTI FOR HIM 50+) TABS, Take 1 tablet by mouth daily. , Disp: , Rfl:    Omega 3-6-9 CAPS, Take 1 capsule by mouth daily. , Disp: , Rfl:    ONETOUCH VERIO test strip, , Disp: , Rfl:    pioglitazone (ACTOS) 15 MG tablet, Take 15 mg by mouth daily., Disp: , Rfl:    psyllium (METAMUCIL) 58.6 % powder, Take 1 packet by mouth daily. , Disp: , Rfl:    Red Yeast Rice 600 MG TABS, Take 1 tablet by mouth daily. , Disp: , Rfl:    rosuvastatin (CRESTOR) 5 MG tablet, Take by mouth., Disp: , Rfl:    Saw Palmetto 160 MG CAPS, Take 1 capsule by mouth daily. , Disp: , Rfl:    vitamin B-12 (CYANOCOBALAMIN) 1000 MCG tablet, Take 1,000 mcg by mouth daily., Disp: , Rfl:    predniSONE (DELTASONE) 20 MG tablet, Take 1 tablet (20 mg total) by mouth  daily with breakfast., Disp: 30 tablet, Rfl: 3   Allergies: No Known Allergies  REVIEW OF SYSTEMS:   Review of Systems  Constitutional:  Negative for chills, fatigue and fever.  HENT:   Negative for lump/mass, mouth sores, nosebleeds, sore throat and trouble swallowing.   Eyes:  Negative for eye problems.  Respiratory:  Negative for cough and shortness of breath.   Cardiovascular:  Positive for leg swelling. Negative for chest pain and palpitations.  Gastrointestinal:  Negative for abdominal pain, constipation, diarrhea, nausea and vomiting.  Genitourinary:  Positive for frequency. Negative for bladder incontinence, difficulty urinating, dysuria, hematuria and nocturia.   Musculoskeletal:  Negative for arthralgias, back pain, flank pain, myalgias and neck pain.  Skin:  Negative for itching and rash.  Neurological:  Positive for numbness. Negative for dizziness and headaches.  Hematological:  Does not bruise/bleed easily.  Psychiatric/Behavioral:  Positive for sleep disturbance. Negative for depression and suicidal ideas. The patient is not nervous/anxious.   All other systems reviewed and are negative.    VITALS:   Blood pressure (!) 154/69, pulse 70, resp. rate 18, weight 211 lb 12.8 oz (96.1 kg), SpO2 97 %.  Wt Readings from Last 3 Encounters:  06/24/22 211 lb 12.8 oz (96.1 kg)  06/10/22 211 lb 6.4 oz (95.9 kg)  05/22/22 211 lb 10.3 oz (96 kg)    Body mass index is 27.94 kg/m.  Performance status (ECOG): 0 - Asymptomatic  PHYSICAL EXAM:   Physical Exam Vitals and nursing note reviewed. Exam conducted with a chaperone present.  Constitutional:      Appearance: Normal appearance.  Cardiovascular:     Rate and Rhythm: Normal rate and regular rhythm.  Pulses: Normal pulses.     Heart sounds: Normal heart sounds.  Pulmonary:     Effort: Pulmonary effort is normal.     Breath sounds: Normal breath sounds.  Abdominal:     Palpations: Abdomen is soft. There is no  hepatomegaly, splenomegaly or mass.     Tenderness: There is no abdominal tenderness.  Musculoskeletal:     Right lower leg: No edema.     Left lower leg: No edema.  Lymphadenopathy:     Cervical: No cervical adenopathy.     Right cervical: No superficial, deep or posterior cervical adenopathy.    Left cervical: No superficial, deep or posterior cervical adenopathy.     Upper Body:     Right upper body: No supraclavicular or axillary adenopathy.     Left upper body: No supraclavicular or axillary adenopathy.  Neurological:     General: No focal deficit present.     Mental Status: He is alert and oriented to person, place, and time.  Psychiatric:        Mood and Affect: Mood normal.        Behavior: Behavior normal.     LABS:      Latest Ref Rng & Units 06/17/2022    9:33 AM 05/17/2022    1:48 PM 04/23/2022   11:01 AM  CBC  WBC 4.0 - 10.5 K/uL 11.0  12.6  13.2   Hemoglobin 13.0 - 17.0 g/dL 40.9  81.1  91.4   Hematocrit 39.0 - 52.0 % 45.1  40.6  44.6   Platelets 150 - 400 K/uL 312  126  149       Latest Ref Rng & Units 06/17/2022    9:33 AM 05/17/2022    1:48 PM 04/23/2022   11:01 AM  CMP  Glucose 70 - 99 mg/dL 782  956  213   BUN 8 - 23 mg/dL 30  41  32   Creatinine 0.61 - 1.24 mg/dL 0.86  5.78  4.69   Sodium 135 - 145 mmol/L 136  133  133   Potassium 3.5 - 5.1 mmol/L 4.2  4.4  4.8   Chloride 98 - 111 mmol/L 99  101  97   CO2 22 - 32 mmol/L 30  24  29    Calcium 8.9 - 10.3 mg/dL 9.5  8.9  8.8   Total Protein 6.5 - 8.1 g/dL 6.9  5.5  5.9   Total Bilirubin 0.3 - 1.2 mg/dL 1.3  1.2  1.6   Alkaline Phos 38 - 126 U/L 61  60  66   AST 15 - 41 U/L 20  20  24    ALT 0 - 44 U/L 35  41  42      No results found for: "CEA1", "CEA" / No results found for: "CEA1", "CEA" No results found for: "PSA1" No results found for: "GEX528" No results found for: "CAN125"  Lab Results  Component Value Date   TOTALPROTELP 6.1 09/15/2015   Lab Results  Component Value Date   FERRITIN 138  03/25/2016   Lab Results  Component Value Date   LDH 266 (H) 06/17/2022   LDH 274 (H) 05/17/2022   LDH 220 (H) 04/23/2022     STUDIES:   No results found.

## 2022-06-24 ENCOUNTER — Encounter: Payer: Self-pay | Admitting: Hematology

## 2022-06-24 ENCOUNTER — Other Ambulatory Visit: Payer: Self-pay | Admitting: *Deleted

## 2022-06-24 ENCOUNTER — Inpatient Hospital Stay (HOSPITAL_BASED_OUTPATIENT_CLINIC_OR_DEPARTMENT_OTHER): Payer: Medicare Other | Admitting: Hematology

## 2022-06-24 DIAGNOSIS — D591 Autoimmune hemolytic anemia, unspecified: Secondary | ICD-10-CM

## 2022-06-24 MED ORDER — PREDNISONE 20 MG PO TABS
20.0000 mg | ORAL_TABLET | Freq: Every day | ORAL | 3 refills | Status: DC
Start: 2022-06-24 — End: 2022-08-12

## 2022-06-24 NOTE — Patient Instructions (Addendum)
[facility] at [facility] Discharge Instructions   You were seen and examined today by Dr. Ellin Saba.  He reviewed the results of your lab work. Your Coomb's test remains positive. We will keep the dose of your prednisone at 20 mg daily right now. Your hemoglobin is normal at 14.4.   We will see you back in 6 weeks. We will repeat your blood work prior to your next visit.      Thank you for choosing [facility] at [facility] to provide your oncology and hematology care.  To afford each patient quality time with our provider, please arrive at least 15 minutes before your scheduled appointment time.   If you have a lab appointment with the Cancer Center please come in thru the Main Entrance and check in at the main information desk.  You need to re-schedule your appointment should you arrive 10 or more minutes late.  We strive to give you quality time with our providers, and arriving late affects you and other patients whose appointments are after yours.  Also, if you no show three or more times for appointments you may be dismissed from the clinic at the providers discretion.     Again, thank you for choosing [facility].  Our hope is that these requests will decrease the amount of time that you wait before being seen by our physicians.       _____________________________________________________________  Should you have questions after your visit to [facility], please contact our office at [phone] and follow the prompts.  Our office hours are 8:00 a.m. and 4:30 p.m. Monday - Friday.  Please note that voicemails left after 4:00 p.m. may not be returned until the following business day.  We are closed weekends and major holidays.  You do have access to a nurse 24-7, just call the main number to the clinic [phone] and do not press any options, hold on the line and a nurse will answer the phone.    For  prescription refill requests, have your pharmacy contact our office and allow 72 hours.    Due to Covid, you will need to wear a mask upon entering the hospital. If you do not have a mask, a mask will be given to you at the Main Entrance upon arrival. For doctor visits, patients may have 1 support person age 47 or older with them. For treatment visits, patients can not have anyone with them due to social distancing guidelines and our immunocompromised population.

## 2022-07-03 ENCOUNTER — Other Ambulatory Visit: Payer: Self-pay | Admitting: Hematology

## 2022-07-03 DIAGNOSIS — D591 Autoimmune hemolytic anemia, unspecified: Secondary | ICD-10-CM

## 2022-08-01 ENCOUNTER — Other Ambulatory Visit: Payer: Self-pay | Admitting: Hematology

## 2022-08-01 DIAGNOSIS — D591 Autoimmune hemolytic anemia, unspecified: Secondary | ICD-10-CM

## 2022-08-05 ENCOUNTER — Inpatient Hospital Stay: Payer: Medicare Other | Attending: Hematology

## 2022-08-05 DIAGNOSIS — D591 Autoimmune hemolytic anemia, unspecified: Secondary | ICD-10-CM | POA: Insufficient documentation

## 2022-08-05 LAB — CBC WITH DIFFERENTIAL/PLATELET
Abs Immature Granulocytes: 0.18 10*3/uL — ABNORMAL HIGH (ref 0.00–0.07)
Basophils Absolute: 0.1 10*3/uL (ref 0.0–0.1)
Basophils Relative: 1 %
Eosinophils Absolute: 0.1 10*3/uL (ref 0.0–0.5)
Eosinophils Relative: 1 %
HCT: 42.8 % (ref 39.0–52.0)
Hemoglobin: 14.1 g/dL (ref 13.0–17.0)
Immature Granulocytes: 2 %
Lymphocytes Relative: 20 %
Lymphs Abs: 2.1 10*3/uL (ref 0.7–4.0)
MCH: 30.7 pg (ref 26.0–34.0)
MCHC: 32.9 g/dL (ref 30.0–36.0)
MCV: 93.2 fL (ref 80.0–100.0)
Monocytes Absolute: 0.9 10*3/uL (ref 0.1–1.0)
Monocytes Relative: 8 %
Neutro Abs: 7.2 10*3/uL (ref 1.7–7.7)
Neutrophils Relative %: 68 %
Platelets: 219 10*3/uL (ref 150–400)
RBC: 4.59 MIL/uL (ref 4.22–5.81)
RDW: 15.2 % (ref 11.5–15.5)
WBC: 10.5 10*3/uL (ref 4.0–10.5)
nRBC: 0 % (ref 0.0–0.2)

## 2022-08-05 LAB — HEPATIC FUNCTION PANEL
ALT: 35 U/L (ref 0–44)
AST: 19 U/L (ref 15–41)
Albumin: 3.6 g/dL (ref 3.5–5.0)
Alkaline Phosphatase: 58 U/L (ref 38–126)
Bilirubin, Direct: 0.2 mg/dL (ref 0.0–0.2)
Indirect Bilirubin: 1.2 mg/dL — ABNORMAL HIGH (ref 0.3–0.9)
Total Bilirubin: 1.4 mg/dL — ABNORMAL HIGH (ref 0.3–1.2)
Total Protein: 6.2 g/dL — ABNORMAL LOW (ref 6.5–8.1)

## 2022-08-05 LAB — RETICULOCYTES
Immature Retic Fract: 23.8 % — ABNORMAL HIGH (ref 2.3–15.9)
RBC.: 4.65 MIL/uL (ref 4.22–5.81)
Retic Count, Absolute: 129.3 10*3/uL (ref 19.0–186.0)
Retic Ct Pct: 2.8 % (ref 0.4–3.1)

## 2022-08-05 LAB — LACTATE DEHYDROGENASE: LDH: 241 U/L — ABNORMAL HIGH (ref 98–192)

## 2022-08-05 LAB — DIRECT ANTIGLOBULIN TEST (NOT AT ARMC)
DAT, IgG: POSITIVE
DAT, complement: POSITIVE

## 2022-08-11 NOTE — Progress Notes (Signed)
[facility] [address] [place] [address]    Clinic Day:  08/12/2022  Referring physician: [name], MD  Patient Care Team: [name], MD as PCP - General (Internal Medicine) Jonelle Sidle, MD as PCP - Cardiology (Cardiology) Charna Elizabeth, MD as Consulting Physician (Gastroenterology) Jonelle Sidle, MD as Consulting Physician (Cardiology)   ASSESSMENT & PLAN:   Assessment: 1.  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 - Rituximab weekly x4 in June 2015. - He is on prednisone 5 mg daily, most recent taper was in May 2019 from 10 mg daily. -He took 5 mg daily for a long time.  Prednisone was completely tapered off in November 2021. 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  He is on metformin and Actos.    Plan: 1.  Autoimmune hemolytic anemia: - He is currently taking prednisone 20 mg daily and tolerating well. - Reviewed labs from 08/05/2022: Total bilirubin 1.4, indirect bilirubin 1.2.  LDH 241 and hemoglobin stable at 14.1.  Reticulocyte percent is 2.8.  DAT is positive. - Recommend continuing prednisone 20 mg daily.  RTC 2 months for follow-up with repeat labs.  Dr. Sherril Croon is managing his diabetes   2.  Leg swellings: - Continue Lasix 20 mg daily as needed which has helped his swelling.    No orders of the defined types were placed in this encounter.     I,Katie Daubenspeck,acting as a Neurosurgeon for Doreatha Massed, MD.,have documented all relevant documentation on the behalf of Doreatha Massed, MD,as directed by  Doreatha Massed, MD while in the presence of Doreatha Massed, MD.   I, Doreatha Massed MD, have reviewed the above documentation for accuracy and completeness, and I agree with the  above.   Doreatha Massed, MD   7/1/202412:35 PM  CHIEF COMPLAINT:   Diagnosis: autoimmune hemolytic anemia    Cancer Staging  No matching staging information was found for the patient.   Prior Therapy: 1. High dose prednisone in 09/2012. 2. Rituximab weekly x 4 in 07/2013. 3. Prednisone taper through 12/2019.  Current Therapy:  prednisone taper, currently 20 mg    HISTORY OF PRESENT ILLNESS:   Oncology History   No history exists.     INTERVAL HISTORY:   [name] Page is a 77 y.o. male presenting to clinic today for follow up of autoimmune hemolytic anemia. He was last seen by me on 06/24/22.  Today, he states that he is doing well overall. His appetite level is at 100%. His energy level is at 100%.  PAST MEDICAL HISTORY:   Past Medical History: Past Medical History:  Diagnosis Date   Coronary atherosclerosis of native coronary artery    Multivessel, LVEF 59%   Essential hypertension    Hemolytic anemia (HCC)    Hyperlipidemia    Pernicious anemia     Surgical History: Past Surgical History:  Procedure Laterality Date   CORONARY ARTERY BYPASS GRAFT  2004   LIMA to LAD, SVG to OM, SVG to PDA    Social History: Social History   Socioeconomic History   Marital status: Widowed    Spouse name: Not on file   Number of children: Not on file   Years of education: Not on file   Highest education  level: Not on file  Occupational History   Occupation: Full time  Tobacco Use   Smoking status: Former    Types: Cigarettes    Quit date: 02/11/2002    Years since quitting: 20.5   Smokeless tobacco: Never  Substance and Sexual Activity   Alcohol use: No    Alcohol/week: 0.0 standard drinks of alcohol   Drug use: No   Sexual activity: Not on file  Other Topics Concern   Not on file  Social History Narrative   Not on file   Social Determinants of Health   Financial Resource Strain: Low Risk  (12/20/2019)   Overall Financial Resource Strain (CARDIA)     Difficulty of Paying Living Expenses: Not hard at all  Food Insecurity: No Food Insecurity (12/20/2019)   Hunger Vital Sign    Worried About Running Out of Food in the Last Year: Never true    Ran Out of Food in the Last Year: Never true  Transportation Needs: No Transportation Needs (12/20/2019)   PRAPARE - Administrator, Civil Service (Medical): No    Lack of Transportation (Non-Medical): No  Physical Activity: Inactive (12/20/2019)   Exercise Vital Sign    Days of Exercise per Week: 0 days    Minutes of Exercise per Session: 0 min  Stress: No Stress Concern Present (12/20/2019)   [employer] of Occupational Health - Occupational Stress Questionnaire    Feeling of Stress : Not at all  Social Connections: Moderately Integrated (12/20/2019)   Social Connection and Isolation Panel [NHANES]    Frequency of Communication with Friends and Family: More than three times a week    Frequency of Social Gatherings with Friends and Family: Once a week    Attends Religious Services: More than 4 times per year    Active Member of [employer] or Organizations: Yes    Attends Banker Meetings: Never    Marital Status: Widowed  Intimate Partner Violence: Not At Risk (12/20/2019)   Humiliation, Afraid, Rape, and Kick questionnaire    Fear of Current or Ex-Partner: No    Emotionally Abused: No    Physically Abused: No    Sexually Abused: No    Family History: Family History  Problem Relation Age of Onset   Cancer Mother        colon   COPD Father    Ulcerative colitis Father    Heart attack Father     Current Medications:  Current Outpatient Medications:    APPLE CIDER VINEGAR PO, Take 30 mLs by mouth daily., Disp: , Rfl:    aspirin EC 81 MG tablet, Take 162 mg by mouth daily., Disp: , Rfl:    Aspirin-Acetaminophen-Caffeine 500-325-65 MG PACK, Take by mouth daily as needed., Disp: , Rfl:    b complex vitamins tablet, Take 1 tablet by mouth daily., Disp: , Rfl:     calcium carbonate (OS-CAL) 600 MG TABS tablet, Take 600 mg by mouth 2 (two) times daily with a meal. , Disp: , Rfl:    Cranberry 500 MG CAPS, Take 2 capsules by mouth daily. , Disp: , Rfl:    FIASP FLEXTOUCH 100 UNIT/ML FlexTouch Pen, Inject 5 Units into the skin 3 (three) times daily before meals., Disp: , Rfl:    folic acid (FOLVITE) 1 MG tablet, take 1 tablet by mouth once daily., Disp: 30 tablet, Rfl: 6   furosemide (LASIX) 40 MG tablet, Take 40 mg by mouth daily., Disp: , Rfl:  GARLIC OIL PO, Take 1 capsule by mouth daily., Disp: , Rfl:    levothyroxine (SYNTHROID) 75 MCG tablet, Take 75 mcg by mouth daily., Disp: , Rfl:    losartan (COZAAR) 50 MG tablet, Take 1 tablet (50 mg total) by mouth daily., Disp: 90 tablet, Rfl: 1   Magnesium 250 MG TABS, Take 1 tablet by mouth daily., Disp: , Rfl:    metFORMIN (GLUCOPHAGE) 500 MG tablet, , Disp: , Rfl:    metoprolol succinate (TOPROL-XL) 50 MG 24 hr tablet, Take 25 mg by mouth 2 (two) times daily., Disp: , Rfl:    Misc Natural Products (PROSTATE CONTROL PO), Take 1 tablet by mouth daily., Disp: , Rfl:    Multiple Vitamins-Minerals (MULTI FOR HIM 50+) TABS, Take 1 tablet by mouth daily. , Disp: , Rfl:    Omega 3-6-9 CAPS, Take 1 capsule by mouth daily. , Disp: , Rfl:    ONETOUCH VERIO test strip, , Disp: , Rfl:    pioglitazone (ACTOS) 15 MG tablet, Take 15 mg by mouth daily., Disp: , Rfl:    psyllium (METAMUCIL) 58.6 % powder, Take 1 packet by mouth daily. , Disp: , Rfl:    Red Yeast Rice 600 MG TABS, Take 1 tablet by mouth daily. , Disp: , Rfl:    rosuvastatin (CRESTOR) 5 MG tablet, Take by mouth., Disp: , Rfl:    Saw Palmetto 160 MG CAPS, Take 1 capsule by mouth daily. , Disp: , Rfl:    vitamin B-12 (CYANOCOBALAMIN) 1000 MCG tablet, Take 1,000 mcg by mouth daily., Disp: , Rfl:    predniSONE (DELTASONE) 20 MG tablet, Take 1 tablet (20 mg total) by mouth daily with breakfast., Disp: 30 tablet, Rfl: 3   Allergies: No Known Allergies  REVIEW  OF SYSTEMS:   Review of Systems  Constitutional:  Negative for chills, fatigue and fever.  HENT:   Negative for lump/mass, mouth sores, nosebleeds, sore throat and trouble swallowing.   Eyes:  Negative for eye problems.  Respiratory:  Positive for shortness of breath. Negative for cough.   Cardiovascular:  Negative for chest pain, leg swelling and palpitations.  Gastrointestinal:  Negative for abdominal pain, constipation, diarrhea, nausea and vomiting.  Genitourinary:  Negative for bladder incontinence, difficulty urinating, dysuria, frequency, hematuria and nocturia.   Musculoskeletal:  Negative for arthralgias, back pain, flank pain, myalgias and neck pain.  Skin:  Negative for itching and rash.  Neurological:  Positive for numbness. Negative for dizziness and headaches.  Hematological:  Does not bruise/bleed easily.  Psychiatric/Behavioral:  Negative for depression, sleep disturbance and suicidal ideas. The patient is not nervous/anxious.   All other systems reviewed and are negative.    VITALS:   Blood pressure (!) 140/66, pulse 70, temperature (!) 97.5 F (36.4 C), resp. rate 20, weight 221 lb 4.8 oz (100.4 kg), SpO2 98 %.  Wt Readings from Last 3 Encounters:  08/12/22 221 lb 4.8 oz (100.4 kg)  06/24/22 211 lb 12.8 oz (96.1 kg)  06/10/22 211 lb 6.4 oz (95.9 kg)    Body mass index is 29.2 kg/m.  Performance status (ECOG): 1 - Symptomatic but completely ambulatory  PHYSICAL EXAM:   Physical Exam Vitals reviewed.  Constitutional:      Appearance: Normal appearance.  Cardiovascular:     Rate and Rhythm: Normal rate.     Heart sounds: Normal heart sounds.  Pulmonary:     Effort: Pulmonary effort is normal.     Breath sounds: Normal breath sounds.  Abdominal:  General: There is no distension.     Palpations: Abdomen is soft.  Neurological:     Mental Status: He is alert.  Psychiatric:        Mood and Affect: Mood normal.        Behavior: Behavior normal.      LABS:      Latest Ref Rng & Units 08/05/2022    9:33 AM 06/17/2022    9:33 AM 05/17/2022    1:48 PM  CBC  WBC 4.0 - 10.5 K/uL 10.5  11.0  12.6   Hemoglobin 13.0 - 17.0 g/dL 09.8  11.9  14.7   Hematocrit 39.0 - 52.0 % 42.8  45.1  40.6   Platelets 150 - 400 K/uL 219  312  126       Latest Ref Rng & Units 08/05/2022    9:33 AM 06/17/2022    9:33 AM 05/17/2022    1:48 PM  CMP  Glucose 70 - 99 mg/dL  829  562   BUN 8 - 23 mg/dL  30  41   Creatinine 1.30 - 1.24 mg/dL  8.65  7.84   Sodium 696 - 145 mmol/L  136  133   Potassium 3.5 - 5.1 mmol/L  4.2  4.4   Chloride 98 - 111 mmol/L  99  101   CO2 22 - 32 mmol/L  30  24   Calcium 8.9 - 10.3 mg/dL  9.5  8.9   Total Protein 6.5 - 8.1 g/dL 6.2  6.9  5.5   Total Bilirubin 0.3 - 1.2 mg/dL 1.4  1.3  1.2   Alkaline Phos 38 - 126 U/L 58  61  60   AST 15 - 41 U/L 19  20  20    ALT 0 - 44 U/L 35  35  41      No results found for: "CEA1", "CEA" / No results found for: "CEA1", "CEA" No results found for: "PSA1" No results found for: "EXB284" No results found for: "CAN125"  Lab Results  Component Value Date   TOTALPROTELP 6.1 09/15/2015   Lab Results  Component Value Date   FERRITIN 138 03/25/2016   Lab Results  Component Value Date   LDH 241 (H) 08/05/2022   LDH 266 (H) 06/17/2022   LDH 274 (H) 05/17/2022     STUDIES:   No results found.

## 2022-08-12 ENCOUNTER — Other Ambulatory Visit: Payer: Self-pay | Admitting: *Deleted

## 2022-08-12 ENCOUNTER — Encounter: Payer: Self-pay | Admitting: Hematology

## 2022-08-12 ENCOUNTER — Inpatient Hospital Stay: Payer: Medicare Other | Attending: Hematology | Admitting: Hematology

## 2022-08-12 DIAGNOSIS — E119 Type 2 diabetes mellitus without complications: Secondary | ICD-10-CM | POA: Diagnosis not present

## 2022-08-12 DIAGNOSIS — D591 Autoimmune hemolytic anemia, unspecified: Secondary | ICD-10-CM

## 2022-08-12 DIAGNOSIS — E785 Hyperlipidemia, unspecified: Secondary | ICD-10-CM | POA: Diagnosis not present

## 2022-08-12 DIAGNOSIS — I10 Essential (primary) hypertension: Secondary | ICD-10-CM | POA: Diagnosis not present

## 2022-08-12 DIAGNOSIS — Z7984 Long term (current) use of oral hypoglycemic drugs: Secondary | ICD-10-CM | POA: Insufficient documentation

## 2022-08-12 DIAGNOSIS — Z7952 Long term (current) use of systemic steroids: Secondary | ICD-10-CM | POA: Diagnosis not present

## 2022-08-12 DIAGNOSIS — Z79899 Other long term (current) drug therapy: Secondary | ICD-10-CM | POA: Insufficient documentation

## 2022-08-12 MED ORDER — PREDNISONE 20 MG PO TABS
20.0000 mg | ORAL_TABLET | Freq: Every day | ORAL | 3 refills | Status: DC
Start: 2022-08-12 — End: 2022-10-24

## 2022-08-12 NOTE — Patient Instructions (Addendum)
[facility] Discharge Instructions   You were seen and examined today by Dr. Ellin Saba.  He reviewed the results of your lab work which are normal/stable. Your Coomb's test is positive. Dr. Kirtland Bouchard wants you to continue the prednisone as prescribed. Use Lasix as needed for swelling in the legs. If you need to double up on the Lasix to get rid of the extra fluid please do so. Also wear compression stockings.   We will see you back in 2 months. We will repeat lab work one week prior to your next visit.   Return as scheduled.    Thank you for choosing [facility] at [facility] to provide your oncology and hematology care.  To afford each patient quality time with our provider, please arrive at least 15 minutes before your scheduled appointment time.   If you have a lab appointment with the Cancer Center please come in thru the Main Entrance and check in at the main information desk.  You need to re-schedule your appointment should you arrive 10 or more minutes late.  We strive to give you quality time with our providers, and arriving late affects you and other patients whose appointments are after yours.  Also, if you no show three or more times for appointments you may be dismissed from the clinic at the providers discretion.     Again, thank you for choosing [facility]  Our hope is that these requests will decrease the amount of time that you wait before being seen by our physicians.       _____________________________________________________________  Should you have questions after your visit to [facility], please contact our office at [phone] and follow the prompts.  Our office hours are 8:00 a.m. and 4:30 p.m. Monday - Friday.  Please note that voicemails left after 4:00 p.m. may not be returned until the following business day.  We are closed weekends and major holidays.  You do have access to a nurse  24-7, just call the main number to the clinic [phone] and do not press any options, hold on the line and a nurse will answer the phone.    For prescription refill requests, have your pharmacy contact our office and allow 72 hours.    Due to Covid, you will need to wear a mask upon entering the hospital. If you do not have a mask, a mask will be given to you at the Main Entrance upon arrival. For doctor visits, patients may have 1 support person age 21 or older with them. For treatment visits, patients can not have anyone with them due to social distancing guidelines and our immunocompromised population.

## 2022-10-15 ENCOUNTER — Other Ambulatory Visit: Payer: Medicare Other

## 2022-10-17 ENCOUNTER — Inpatient Hospital Stay: Payer: Medicare Other | Attending: Hematology

## 2022-10-17 DIAGNOSIS — E119 Type 2 diabetes mellitus without complications: Secondary | ICD-10-CM | POA: Diagnosis not present

## 2022-10-17 DIAGNOSIS — D591 Autoimmune hemolytic anemia, unspecified: Secondary | ICD-10-CM | POA: Insufficient documentation

## 2022-10-17 DIAGNOSIS — Z7982 Long term (current) use of aspirin: Secondary | ICD-10-CM | POA: Diagnosis not present

## 2022-10-17 DIAGNOSIS — Z79899 Other long term (current) drug therapy: Secondary | ICD-10-CM | POA: Diagnosis not present

## 2022-10-17 DIAGNOSIS — L03115 Cellulitis of right lower limb: Secondary | ICD-10-CM | POA: Diagnosis not present

## 2022-10-17 DIAGNOSIS — Z7984 Long term (current) use of oral hypoglycemic drugs: Secondary | ICD-10-CM | POA: Diagnosis not present

## 2022-10-17 DIAGNOSIS — Z87891 Personal history of nicotine dependence: Secondary | ICD-10-CM | POA: Insufficient documentation

## 2022-10-17 DIAGNOSIS — Z7952 Long term (current) use of systemic steroids: Secondary | ICD-10-CM | POA: Diagnosis not present

## 2022-10-17 LAB — CBC WITH DIFFERENTIAL/PLATELET
Abs Immature Granulocytes: 0.37 10*3/uL — ABNORMAL HIGH (ref 0.00–0.07)
Basophils Absolute: 0.1 10*3/uL (ref 0.0–0.1)
Basophils Relative: 0 %
Eosinophils Absolute: 0.1 10*3/uL (ref 0.0–0.5)
Eosinophils Relative: 0 %
HCT: 40.1 % (ref 39.0–52.0)
Hemoglobin: 13 g/dL (ref 13.0–17.0)
Immature Granulocytes: 2 %
Lymphocytes Relative: 7 %
Lymphs Abs: 1.3 10*3/uL (ref 0.7–4.0)
MCH: 30.4 pg (ref 26.0–34.0)
MCHC: 32.4 g/dL (ref 30.0–36.0)
MCV: 93.9 fL (ref 80.0–100.0)
Monocytes Absolute: 0.4 10*3/uL (ref 0.1–1.0)
Monocytes Relative: 2 %
Neutro Abs: 16 10*3/uL — ABNORMAL HIGH (ref 1.7–7.7)
Neutrophils Relative %: 89 %
Platelets: 377 10*3/uL (ref 150–400)
RBC: 4.27 MIL/uL (ref 4.22–5.81)
RDW: 15.6 % — ABNORMAL HIGH (ref 11.5–15.5)
WBC: 18.2 10*3/uL — ABNORMAL HIGH (ref 4.0–10.5)
nRBC: 0 % (ref 0.0–0.2)

## 2022-10-17 LAB — RETICULOCYTES
Immature Retic Fract: 34 % — ABNORMAL HIGH (ref 2.3–15.9)
RBC.: 4.29 MIL/uL (ref 4.22–5.81)
Retic Count, Absolute: 97 10*3/uL (ref 19.0–186.0)
Retic Ct Pct: 2.3 % (ref 0.4–3.1)

## 2022-10-17 LAB — LACTATE DEHYDROGENASE: LDH: 282 U/L — ABNORMAL HIGH (ref 98–192)

## 2022-10-17 NOTE — Progress Notes (Signed)
Blood bank called and reported this patient has a + DAT. Patient has upcoming appointment with Hetty Ely NP on 10-24-2022. Reported value to her through instant message. No new orders.

## 2022-10-18 LAB — DIRECT ANTIGLOBULIN TEST (NOT AT ARMC)
DAT, IgG: POSITIVE
DAT, complement: POSITIVE

## 2022-10-22 ENCOUNTER — Ambulatory Visit: Payer: Medicare Other | Admitting: Hematology

## 2022-10-24 ENCOUNTER — Inpatient Hospital Stay (HOSPITAL_BASED_OUTPATIENT_CLINIC_OR_DEPARTMENT_OTHER): Payer: Medicare Other | Admitting: Oncology

## 2022-10-24 VITALS — BP 150/62 | HR 80 | Temp 98.0°F | Resp 20 | Wt 224.4 lb

## 2022-10-24 DIAGNOSIS — D591 Autoimmune hemolytic anemia, unspecified: Secondary | ICD-10-CM

## 2022-10-24 MED ORDER — PREDNISONE 10 MG PO TABS: ORAL_TABLET | ORAL | 1 refills | Status: DC

## 2022-10-24 NOTE — Progress Notes (Signed)
[facility] [address], [place] [address]    Clinic Day:  10/24/2022  Referring physician: [name], MD  Patient Care Team: [name] Specking, MD as PCP - General (Internal Medicine) [name] Sidle, MD as PCP - Cardiology (Cardiology) [name] Elizabeth, MD as Consulting Physician (Gastroenterology) [name] Sidle, MD as Consulting Physician (Cardiology)   ASSESSMENT & PLAN:   Assessment: 1.  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 - Rituximab weekly x4 in June 2015. - He is on prednisone 5 mg daily, most recent taper was in May 2019 from 10 mg daily. -He took 5 mg daily for a long time.  Prednisone was completely tapered off in November 2021. - Prednisone 100 mg daily started back on 03/26/2022 for relapsed AIHA, tapered to 60 mg daily on 04/24/2022, tapered to 40 mg daily on 05/08/2022, 20 mg daily on 05/22/2022.  -He is currently on 20 mg prednisone tablets.   2.  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  He is on metformin and Actos.   Plan: 1.  Autoimmune hemolytic anemia: - He is currently taking prednisone 20 mg daily and tolerating well. - Labs from 10/17/2022 show hemoglobin of 13.0, white blood cell count 18.2, LDH 282.  Reticulocyte percent is 2.3.  DAT is positive. - Will decrease prednisone from 20 mg daily to 20 mg rotated with 10 mg each day.  Will resend new prescription for 10 mg prednisone to pharmacy.  Discussed with patient who has verbalized plan. -Dr. Sherril Page is managing his diabetes.   2.  Leg swellings: - Continue Lasix 20 mg daily as needed which has helped his swelling. 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  3.  Right leg cellulitis: -Evaluated at [facility] on 10/18/2022 and found to have right leg cellulitis. -Had venous duplex right lower extremity which was negative for DVT. -He was placed on antibiotics and symptoms have slowly  improved.  4.  Leukocytosis: -White blood cell count 18.2 likely secondary cellulitis and prednisone. -Continue to monitor.    No orders of the defined types were placed in this encounter.  PLAN SUMMARY: >> New prescription sent for 10 mg prednisone tabs.  Rotate 10 mg with 20 mg each day. >> Return to clinic in 2 months for follow-up with labs and see Dr. Ellin Page.       [name] Kaufmann, [name] Page   9/12/202411:47 AM  CHIEF COMPLAINT:   Diagnosis: autoimmune hemolytic anemia    Cancer Staging  No matching staging information was found for the patient.    Prior Therapy: 1. High dose prednisone in 09/2012. 2. Rituximab weekly x 4 in 07/2013. 3. Prednisone taper through 12/2019.  Current Therapy:  prednisone taper, currently 20 mg rotating with 10 mg daily.   HISTORY OF PRESENT ILLNESS:   Oncology History   No history exists.     INTERVAL HISTORY:   [name] Page is a 77 y.o. male presenting to clinic today for follow up of autoimmune hemolytic anemia. He was last seen in clinic by Dr. Ellin Page on 08/12/2022.  Reports overall doing well.  Appetite 100% energy level 75%.  He is still on prednisone 20 mg daily with fair tolerance.  Has trouble sleeping at times.  He is currently on antibiotics for right leg cellulitis.  Reports he had bilateral lower extremity weeping for several days which eventually went away and then cellulitis developed swelling, pain and redness.  Had Doppler of right lower extremity which was negative for DVT.  Symptoms have almost completely resolved since initiating antibiotics.  He has follow-up with Dr. Sherril Page.    PAST MEDICAL HISTORY:   Past Medical History: Past Medical History:  Diagnosis Date   Coronary atherosclerosis of native coronary artery    Multivessel, LVEF 59%   Essential hypertension    Hemolytic anemia (HCC)    Hyperlipidemia    Pernicious anemia     Surgical History: Past Surgical History:  Procedure Laterality Date    CORONARY ARTERY BYPASS GRAFT  2004   LIMA to LAD, SVG to OM, SVG to PDA    Social History: Social History   Socioeconomic History   Marital status: Widowed    Spouse name: Not on file   Number of children: Not on file   Years of education: Not on file   Highest education level: Not on file  Occupational History   Occupation: Full time  Tobacco Use   Smoking status: Former    Current packs/day: 0.00    Types: Cigarettes    Quit date: 02/11/2002    Years since quitting: 20.7   Smokeless tobacco: Never  Substance and Sexual Activity   Alcohol use: No    Alcohol/week: 0.0 standard drinks of alcohol   Drug use: No   Sexual activity: Not on file  Other Topics Concern   Not on file  Social History Narrative   Not on file   Social Determinants of Health   Financial Resource Strain: Low Risk  (12/20/2019)   Overall Financial Resource Strain (CARDIA)    Difficulty of Paying Living Expenses: Not hard at all  Food Insecurity: No Food Insecurity (12/20/2019)   Hunger Vital Sign    Worried About Running Out of Food in the Last Year: Never true    Ran Out of Food in the Last Year: Never true  Transportation Needs: No Transportation Needs (12/20/2019)   PRAPARE - Administrator, Civil Service (Medical): No    Lack of Transportation (Non-Medical): No  Physical Activity: Inactive (12/20/2019)   Exercise Vital Sign    Days of Exercise per Week: 0 days    Minutes of Exercise per Session: 0 min  Stress: No Stress Concern Present (12/20/2019)   [employer] of Occupational Health - Occupational Stress Questionnaire    Feeling of Stress : Not at all  Social Connections: Moderately Integrated (12/20/2019)   Social Connection and Isolation Panel [NHANES]    Frequency of Communication with Friends and Family: More than three times a week    Frequency of Social Gatherings with Friends and Family: Once a week    Attends Religious Services: More than 4 times per year    Active  Member of [employer] or Organizations: Yes    Attends Banker Meetings: Never    Marital Status: Widowed  Intimate Partner Violence: Not At Risk (12/20/2019)   Humiliation, Afraid, Rape, and Kick questionnaire    Fear of Current or Ex-Partner: No    Emotionally Abused: No    Physically Abused: No    Sexually Abused: No    Family History: Family History  Problem Relation Age of Onset   Cancer Mother        colon   COPD Father    Ulcerative colitis Father    Heart attack Father     Current Medications:  Current Outpatient Medications:    APPLE CIDER VINEGAR PO, Take 30 mLs by mouth  daily., Disp: , Rfl:    aspirin EC 81 MG tablet, Take 162 mg by mouth daily., Disp: , Rfl:    Aspirin-Acetaminophen-Caffeine 500-325-65 MG PACK, Take by mouth daily as needed., Disp: , Rfl:    b complex vitamins tablet, Take 1 tablet by mouth daily., Disp: , Rfl:    calcium carbonate (OS-CAL) 600 MG TABS tablet, Take 600 mg by mouth 2 (two) times daily with a meal. , Disp: , Rfl:    Cranberry 500 MG CAPS, Take 2 capsules by mouth daily. , Disp: , Rfl:    FIASP FLEXTOUCH 100 UNIT/ML FlexTouch Pen, Inject 5 Units into the skin 3 (three) times daily before meals., Disp: , Rfl:    folic acid (FOLVITE) 1 MG tablet, take 1 tablet by mouth once daily., Disp: 30 tablet, Rfl: 6   furosemide (LASIX) 40 MG tablet, Take 40 mg by mouth daily., Disp: , Rfl:    GARLIC OIL PO, Take 1 capsule by mouth daily., Disp: , Rfl:    levofloxacin (LEVAQUIN) 500 MG tablet, Take 500 mg by mouth daily., Disp: , Rfl:    levothyroxine (SYNTHROID) 75 MCG tablet, Take 75 mcg by mouth daily., Disp: , Rfl:    losartan (COZAAR) 50 MG tablet, Take 1 tablet (50 mg total) by mouth daily., Disp: 90 tablet, Rfl: 1   Magnesium 250 MG TABS, Take 1 tablet by mouth daily., Disp: , Rfl:    metFORMIN (GLUCOPHAGE) 500 MG tablet, , Disp: , Rfl:    metoprolol succinate (TOPROL-XL) 50 MG 24 hr tablet, Take 25 mg by mouth 2 (two) times daily.,  Disp: , Rfl:    Misc Natural Products (PROSTATE CONTROL PO), Take 1 tablet by mouth daily., Disp: , Rfl:    Multiple Vitamins-Minerals (MULTI FOR HIM 50+) TABS, Take 1 tablet by mouth daily. , Disp: , Rfl:    Omega 3-6-9 CAPS, Take 1 capsule by mouth daily. , Disp: , Rfl:    ONETOUCH VERIO test strip, , Disp: , Rfl:    pioglitazone (ACTOS) 15 MG tablet, Take 15 mg by mouth daily., Disp: , Rfl:    predniSONE (DELTASONE) 20 MG tablet, Take 1 tablet (20 mg total) by mouth daily with breakfast., Disp: 30 tablet, Rfl: 3   psyllium (METAMUCIL) 58.6 % powder, Take 1 packet by mouth daily. , Disp: , Rfl:    Red Yeast Rice 600 MG TABS, Take 1 tablet by mouth daily. , Disp: , Rfl:    rosuvastatin (CRESTOR) 5 MG tablet, Take by mouth., Disp: , Rfl:    Saw Palmetto 160 MG CAPS, Take 1 capsule by mouth daily. , Disp: , Rfl:    vitamin B-12 (CYANOCOBALAMIN) 1000 MCG tablet, Take 1,000 mcg by mouth daily., Disp: , Rfl:    Allergies: No Known Allergies  REVIEW OF SYSTEMS:   Review of Systems  Constitutional:  Positive for fatigue.  Respiratory:  Positive for chest tightness.   Cardiovascular:  Positive for leg swelling (Right leg).  Gastrointestinal:  Positive for nausea. Negative for diarrhea and vomiting.  Skin:  Positive for rash (Right leg cellulitis).  Neurological:  Positive for numbness.  Hematological:  Negative for adenopathy.  Psychiatric/Behavioral:  Negative for sleep disturbance. The patient is not nervous/anxious.      VITALS:   Weight 224 lb 6.4 oz (101.8 kg).  Wt Readings from Last 3 Encounters:  10/24/22 224 lb 6.4 oz (101.8 kg)  08/12/22 221 lb 4.8 oz (100.4 kg)  06/24/22 211 lb 12.8 oz (96.1 kg)  Body mass index is 29.61 kg/m.  Performance status (ECOG): 1 - Symptomatic but completely ambulatory  PHYSICAL EXAM:   Physical Exam Vitals reviewed.  Constitutional:      Appearance: Normal appearance. He is obese.  Cardiovascular:     Rate and Rhythm: Normal rate.      Heart sounds: Normal heart sounds.  Pulmonary:     Effort: Pulmonary effort is normal.     Breath sounds: Normal breath sounds.  Abdominal:     General: There is no distension.     Palpations: Abdomen is soft.  Skin:    Findings: Ecchymosis, erythema and rash (Right shin cellulitis that is improving) present. Rash is crusting.  Neurological:     Mental Status: He is alert.  Psychiatric:        Mood and Affect: Mood normal.        Behavior: Behavior normal.     LABS:      Latest Ref Rng & Units 10/17/2022   10:29 AM 08/05/2022    9:33 AM 06/17/2022    9:33 AM  CBC  WBC 4.0 - 10.5 K/uL 18.2  10.5  11.0   Hemoglobin 13.0 - 17.0 g/dL 04.5  40.9  81.1   Hematocrit 39.0 - 52.0 % 40.1  42.8  45.1   Platelets 150 - 400 K/uL 377  219  312       Latest Ref Rng & Units 08/05/2022    9:33 AM 06/17/2022    9:33 AM 05/17/2022    1:48 PM  CMP  Glucose 70 - 99 mg/dL  914  782   BUN 8 - 23 mg/dL  30  41   Creatinine 9.56 - 1.24 mg/dL  2.13  0.86   Sodium 578 - 145 mmol/L  136  133   Potassium 3.5 - 5.1 mmol/L  4.2  4.4   Chloride 98 - 111 mmol/L  99  101   CO2 22 - 32 mmol/L  30  24   Calcium 8.9 - 10.3 mg/dL  9.5  8.9   Total Protein 6.5 - 8.1 g/dL 6.2  6.9  5.5   Total Bilirubin 0.3 - 1.2 mg/dL 1.4  1.3  1.2   Alkaline Phos 38 - 126 U/L 58  61  60   AST 15 - 41 U/L 19  20  20    ALT 0 - 44 U/L 35  35  41      No results found for: "CEA1", "CEA" / No results found for: "CEA1", "CEA" No results found for: "PSA1" No results found for: "ION629" No results found for: "CAN125"  Lab Results  Component Value Date   TOTALPROTELP 6.1 09/15/2015   Lab Results  Component Value Date   FERRITIN 138 03/25/2016   Lab Results  Component Value Date   LDH 282 (H) 10/17/2022   LDH 241 (H) 08/05/2022   LDH 266 (H) 06/17/2022     STUDIES:   No results found.

## 2022-11-28 ENCOUNTER — Ambulatory Visit: Payer: Medicare Other | Admitting: Cardiology

## 2022-12-09 ENCOUNTER — Encounter: Payer: Self-pay | Admitting: Cardiology

## 2022-12-09 ENCOUNTER — Ambulatory Visit: Payer: Medicare Other | Attending: Cardiology | Admitting: Cardiology

## 2022-12-09 VITALS — BP 118/84 | HR 83 | Ht 73.0 in | Wt 232.6 lb

## 2022-12-09 DIAGNOSIS — I25119 Atherosclerotic heart disease of native coronary artery with unspecified angina pectoris: Secondary | ICD-10-CM

## 2022-12-09 DIAGNOSIS — I10 Essential (primary) hypertension: Secondary | ICD-10-CM | POA: Diagnosis not present

## 2022-12-09 DIAGNOSIS — E782 Mixed hyperlipidemia: Secondary | ICD-10-CM | POA: Diagnosis not present

## 2022-12-09 NOTE — Patient Instructions (Addendum)

## 2022-12-09 NOTE — Progress Notes (Signed)
    Cardiology Office Note  Date: 12/09/2022   ID: [name] Page, [name] Page [birth year], MRN [id number]  History of Present Illness: [name] Page is a 77 y.o. male last seen in April.  He is here for a routine visit.  Reports no angina, no increasing dyspnea on exertion with typical activities.  He is still taking diuretics for treatment of leg edema, continues on steroids as well although dose is gradually being decreased.  Reports an interval episode of leg cellulitis, improved with antibiotics.  I reviewed his medications.  Current cardiac regimen includes aspirin, Cozaar, Lasix, Toprol-XL, and Crestor.  ECG today shows sinus rhythm with lead motion artifact and PAC.  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  I reviewed his interval lab work as noted below.  He had an echocardiogram done in late September at [facility] which I also reviewed.  Physical Exam: VS:  BP 118/84 (BP Location: Left Arm)   Pulse 83   Ht 6\' 1"  (1.854 m)   Wt 232 lb 9.6 oz (105.5 kg)   SpO2 98%   BMI 30.69 kg/m , BMI Body mass index is 30.69 kg/m.  Wt Readings from Last 3 Encounters:  12/09/22 232 lb 9.6 oz (105.5 kg)  10/24/22 224 lb 6.4 oz (101.8 kg)  08/12/22 221 lb 4.8 oz (100.4 kg)    General: Patient appears comfortable at rest. HEENT: Conjunctiva and lids normal. Neck: Supple, no elevated JVP or carotid bruits.. Cardiac: Regular rate and rhythm, no S3 or significant systolic murmur, no pericardial rub. Abdomen: Soft, bowel sounds present.  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  Labwork: 06/17/2022: BUN 30; Creatinine, Ser 1.05; Potassium 4.2; Sodium 136 08/05/2022: ALT 35; AST 19 10/17/2022: Hemoglobin 13.0; Platelets 377  February 2024: Cholesterol 116, triglycerides 102, HDL 40, LDL 57  Other Studies Reviewed Today:  Echocardiogram 11/11/2022 [facility]): LVEF 55 to 60%  with grade 1 diastolic dysfunction, normal RV contraction, mild aortic valve sclerosis, trace mitral regurgitation, trace tricuspid regurgitation, normal aortic root, no pericardial effusion.  Assessment and Plan:  1.  Multivessel CAD status post CABG in 2004 with LIMA to LAD, SVG to OM, and SVG to PDA.  Lexiscan Myoview in June 2020 revealed evidence of inferior/inferoseptal scar but no active ischemia and LVEF 58%.  ECG reviewed today and stable.  He reports no active angina.  Plan to continue medical therapy including aspirin, Cozaar, Toprol-XL, and Crestor.   2.  Primary hypertension.  Blood pressure much better controlled today.  Continue Cozaar 50 mg daily.   3.  Mixed hyperlipidemia on Crestor.  LDL 57 in February.   4.  Autoimmune hemolytic anemia, following with hematology.  Still on steroids although undergoing slow wean.  Leg swelling addressed with Lasix.  He had an echocardiogram at PCP office in September with LVEF 55 to 60% and mild diastolic dysfunction.  Disposition:  Follow up  6 months.  Signed, Jonelle Sidle, M.D., F.A.C.C. [place]

## 2022-12-20 ENCOUNTER — Other Ambulatory Visit: Payer: Self-pay

## 2022-12-20 DIAGNOSIS — D591 Autoimmune hemolytic anemia, unspecified: Secondary | ICD-10-CM

## 2022-12-23 ENCOUNTER — Inpatient Hospital Stay: Payer: Medicare Other

## 2022-12-23 ENCOUNTER — Inpatient Hospital Stay: Payer: Medicare Other | Attending: Hematology

## 2022-12-23 DIAGNOSIS — D591 Autoimmune hemolytic anemia, unspecified: Secondary | ICD-10-CM | POA: Diagnosis present

## 2022-12-23 DIAGNOSIS — Z79899 Other long term (current) drug therapy: Secondary | ICD-10-CM | POA: Diagnosis not present

## 2022-12-23 LAB — CBC WITH DIFFERENTIAL/PLATELET
Abs Immature Granulocytes: 0.11 10*3/uL — ABNORMAL HIGH (ref 0.00–0.07)
Basophils Absolute: 0.1 10*3/uL (ref 0.0–0.1)
Basophils Relative: 0 %
Eosinophils Absolute: 0 10*3/uL (ref 0.0–0.5)
Eosinophils Relative: 0 %
HCT: 45.2 % (ref 39.0–52.0)
Hemoglobin: 15 g/dL (ref 13.0–17.0)
Immature Granulocytes: 1 %
Lymphocytes Relative: 11 %
Lymphs Abs: 1.8 10*3/uL (ref 0.7–4.0)
MCH: 30.7 pg (ref 26.0–34.0)
MCHC: 33.2 g/dL (ref 30.0–36.0)
MCV: 92.4 fL (ref 80.0–100.0)
Monocytes Absolute: 0.8 10*3/uL (ref 0.1–1.0)
Monocytes Relative: 5 %
Neutro Abs: 13.1 10*3/uL — ABNORMAL HIGH (ref 1.7–7.7)
Neutrophils Relative %: 83 %
Platelets: 251 10*3/uL (ref 150–400)
RBC: 4.89 MIL/uL (ref 4.22–5.81)
RDW: 15.5 % (ref 11.5–15.5)
WBC: 15.9 10*3/uL — ABNORMAL HIGH (ref 4.0–10.5)
nRBC: 0 % (ref 0.0–0.2)

## 2022-12-23 LAB — RETICULOCYTES
Immature Retic Fract: 18.3 % — ABNORMAL HIGH (ref 2.3–15.9)
RBC.: 4.86 MIL/uL (ref 4.22–5.81)
Retic Count, Absolute: 121 10*3/uL (ref 19.0–186.0)
Retic Ct Pct: 2.5 % (ref 0.4–3.1)

## 2022-12-23 LAB — SAMPLE TO BLOOD BANK

## 2022-12-23 LAB — DIRECT ANTIGLOBULIN TEST (NOT AT ARMC)
DAT, IgG: NEGATIVE
DAT, complement: POSITIVE

## 2022-12-23 LAB — LACTATE DEHYDROGENASE: LDH: 247 U/L — ABNORMAL HIGH (ref 98–192)

## 2022-12-30 ENCOUNTER — Inpatient Hospital Stay: Payer: Medicare Other | Admitting: Hematology

## 2023-01-14 NOTE — Progress Notes (Signed)
[facility] [address] [place] [address]    Clinic Day:  01/16/2023  Referring physician: [name], MD  Patient Care Team: [name], MD as PCP - General (Internal Medicine) Jonelle Sidle, MD as PCP - Cardiology (Cardiology) Charna Elizabeth, MD as Consulting Physician (Gastroenterology) Jonelle Sidle, MD as Consulting Physician (Cardiology)   ASSESSMENT & PLAN:   Assessment: 1.  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 - Rituximab weekly x4 in June 2015. - He is on prednisone 5 mg daily, most recent taper was in May 2019 from 10 mg daily. -He took 5 mg daily for a long time.  Prednisone was completely tapered off in November 2021. 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  He is on metformin and Actos.    Plan: 1.  Autoimmune hemolytic anemia: - Prednisone was tapered to 20 mg alternating with 10 mg daily on 10/24/2022. - He had cellulitis of the right leg which resolved. - Reviewed labs from today.  Hemoglobin is 14.6.  LDH is 237.  Platelet count is 240.  Reticulocyte percent is 2.7. - Recommend decreasing prednisone to 10 mg daily.  Will likely very slow taper and may be keep him on 5 to 10 mg dose for long-term to prevent relapse. - RTC 2 months for follow-up with repeat labs.   2.  Leg swellings: - He has 2+ edema bilaterally.  Continue Lasix daily as needed.    Orders Placed This Encounter  Procedures   CBC with Differential    Standing Status:   Future    Number of Occurrences:   1    Standing Expiration Date:   01/16/2024   Reticulocytes    Standing Status:   Future    Number of Occurrences:   1    Standing Expiration Date:   01/16/2024   Lactate dehydrogenase    Standing Status:   Future    Number of  Occurrences:   1    Standing Expiration Date:   01/16/2024      Alben Deeds Teague,acting as a scribe for Doreatha Massed, MD.,have documented all relevant documentation on the behalf of Doreatha Massed, MD,as directed by  Doreatha Massed, MD while in the presence of Doreatha Massed, MD.  I, Doreatha Massed MD, have reviewed the above documentation for accuracy and completeness, and I agree with the above.    Doreatha Massed, MD   12/5/20245:46 PM  CHIEF COMPLAINT:   Diagnosis: autoimmune hemolytic anemia    Cancer Staging  No matching staging information was found for the patient.    Prior Therapy: 1. High dose prednisone in 09/2012. 2. Rituximab weekly x 4 in 07/2013. 3. Prednisone taper through 12/2019.  Current Therapy:  prednisone taper, currently 20 mg    HISTORY OF PRESENT ILLNESS:   Oncology History   No history exists.     INTERVAL HISTORY:   [name] Page is a 77 y.o. male presenting to clinic today for follow up of autoimmune hemolytic anemia. He was last seen by me on 08/12/22. She was seen by Durenda Hurt, NP on 10/24/22.   Today, he states that he is doing well overall. His appetite level is at 100%. His energy level is at 75%.  He is able  to do day-to-day activities. He states his cellulitis in the right leg has resolved, other than mild swelling in the right foot. He is taking Lasix as prescribed. He denies any other infections in the last 2 months.  PAST MEDICAL HISTORY:   Past Medical History: Past Medical History:  Diagnosis Date   Coronary atherosclerosis of native coronary artery    Multivessel, LVEF 59%   Essential hypertension    Hemolytic anemia (HCC)    Hyperlipidemia    Pernicious anemia     Surgical History: Past Surgical History:  Procedure Laterality Date   CORONARY ARTERY BYPASS GRAFT  2004   LIMA to LAD, SVG to OM, SVG to PDA    Social History: Social History   Socioeconomic History   Marital status:  Widowed    Spouse name: Not on file   Number of children: Not on file   Years of education: Not on file   Highest education level: Not on file  Occupational History   Occupation: Full time  Tobacco Use   Smoking status: Former    Current packs/day: 0.00    Types: Cigarettes    Quit date: 02/11/2002    Years since quitting: 20.9   Smokeless tobacco: Never  Substance and Sexual Activity   Alcohol use: No    Alcohol/week: 0.0 standard drinks of alcohol   Drug use: No   Sexual activity: Not on file  Other Topics Concern   Not on file  Social History Narrative   Not on file   Social Determinants of Health   Financial Resource Strain: Low Risk  (12/20/2019)   Overall Financial Resource Strain (CARDIA)    Difficulty of Paying Living Expenses: Not hard at all  Food Insecurity: No Food Insecurity (12/20/2019)   Hunger Vital Sign    Worried About Running Out of Food in the Last Year: Never true    Ran Out of Food in the Last Year: Never true  Transportation Needs: No Transportation Needs (12/20/2019)   PRAPARE - Administrator, Civil Service (Medical): No    Lack of Transportation (Non-Medical): No  Physical Activity: Inactive (12/20/2019)   Exercise Vital Sign    Days of Exercise per Week: 0 days    Minutes of Exercise per Session: 0 min  Stress: No Stress Concern Present (12/20/2019)   [employer] of Occupational Health - Occupational Stress Questionnaire    Feeling of Stress : Not at all  Social Connections: Moderately Integrated (12/20/2019)   Social Connection and Isolation Panel [NHANES]    Frequency of Communication with Friends and Family: More than three times a week    Frequency of Social Gatherings with Friends and Family: Once a week    Attends Religious Services: More than 4 times per year    Active Member of [employer] or Organizations: Yes    Attends Banker Meetings: Never    Marital Status: Widowed  Intimate Partner Violence: Not At Risk  (12/20/2019)   Humiliation, Afraid, Rape, and Kick questionnaire    Fear of Current or Ex-Partner: No    Emotionally Abused: No    Physically Abused: No    Sexually Abused: No    Family History: Family History  Problem Relation Age of Onset   Cancer Mother        colon   COPD Father    Ulcerative colitis Father    Heart attack Father     Current Medications:  Current Outpatient Medications:  APPLE CIDER VINEGAR PO, Take 30 mLs by mouth daily., Disp: , Rfl:    aspirin EC 81 MG tablet, Take 81 mg by mouth daily., Disp: , Rfl:    b complex vitamins tablet, Take 1 tablet by mouth daily., Disp: , Rfl:    calcium carbonate (OS-CAL) 600 MG TABS tablet, Take 600 mg by mouth 2 (two) times daily with a meal. , Disp: , Rfl:    Cranberry 500 MG CAPS, Take 2 capsules by mouth daily. , Disp: , Rfl:    FIASP FLEXTOUCH 100 UNIT/ML FlexTouch Pen, Inject 5 Units into the skin 3 (three) times daily before meals., Disp: , Rfl:    folic acid (FOLVITE) 1 MG tablet, take 1 tablet by mouth once daily., Disp: 30 tablet, Rfl: 6   furosemide (LASIX) 40 MG tablet, Take 40 mg by mouth daily., Disp: , Rfl:    GARLIC OIL PO, Take 1 capsule by mouth daily., Disp: , Rfl:    levothyroxine (SYNTHROID) 75 MCG tablet, Take 75 mcg by mouth daily., Disp: , Rfl:    losartan (COZAAR) 50 MG tablet, Take 1 tablet (50 mg total) by mouth daily., Disp: 90 tablet, Rfl: 1   Magnesium 250 MG TABS, Take 1 tablet by mouth daily., Disp: , Rfl:    metFORMIN (GLUCOPHAGE) 500 MG tablet, , Disp: , Rfl:    metoprolol succinate (TOPROL-XL) 50 MG 24 hr tablet, Take 25 mg by mouth daily., Disp: , Rfl:    Misc Natural Products (PROSTATE CONTROL PO), Take 1 tablet by mouth daily., Disp: , Rfl:    Multiple Vitamins-Minerals (MULTI FOR HIM 50+) TABS, Take 1 tablet by mouth daily. , Disp: , Rfl:    NOVOLOG FLEXPEN 100 UNIT/ML FlexPen, Inject 20 Units into the skin., Disp: , Rfl:    Omega 3-6-9 CAPS, Take 1 capsule by mouth daily. , Disp: ,  Rfl:    ONETOUCH VERIO test strip, , Disp: , Rfl:    predniSONE (DELTASONE) 10 MG tablet, Rotate taking 1 tab (10 mg) with 2 tabs (20 mg) each day until you are seen in clinic in 2 months., Disp: 120 tablet, Rfl: 1   psyllium (METAMUCIL) 58.6 % powder, Take 1 packet by mouth daily. , Disp: , Rfl:    Red Yeast Rice 600 MG TABS, Take 1 tablet by mouth daily. , Disp: , Rfl:    rosuvastatin (CRESTOR) 5 MG tablet, Take 5 mg by mouth once a week., Disp: , Rfl:    Saw Palmetto 160 MG CAPS, Take 1 capsule by mouth daily. , Disp: , Rfl:    TRESIBA FLEXTOUCH 100 UNIT/ML FlexTouch Pen, Inject 20 Units into the skin at bedtime., Disp: , Rfl:    vitamin B-12 (CYANOCOBALAMIN) 1000 MCG tablet, Take 1,000 mcg by mouth daily., Disp: , Rfl:    Allergies: No Known Allergies  REVIEW OF SYSTEMS:   Review of Systems  Constitutional:  Negative for chills, fatigue and fever.  HENT:   Negative for lump/mass, mouth sores, nosebleeds, sore throat and trouble swallowing.   Eyes:  Negative for eye problems.  Respiratory:  Positive for shortness of breath (with exertion). Negative for cough.   Cardiovascular:  Positive for leg swelling. Negative for chest pain and palpitations.  Gastrointestinal:  Negative for abdominal pain, constipation, diarrhea, nausea and vomiting.  Genitourinary:  Negative for bladder incontinence, difficulty urinating, dysuria, frequency, hematuria and nocturia.   Musculoskeletal:  Negative for arthralgias, back pain, flank pain, myalgias and neck pain.  Skin:  Negative  for itching and rash.  Neurological:  Positive for numbness (in hands and feet). Negative for dizziness and headaches.  Hematological:  Does not bruise/bleed easily.  Psychiatric/Behavioral:  Negative for depression, sleep disturbance and suicidal ideas. The patient is not nervous/anxious.   All other systems reviewed and are negative.    VITALS:   Blood pressure (!) 152/75, pulse 80, temperature (!) 97.5 F (36.4 C),  resp. rate 20, weight 237 lb 12.8 oz (107.9 kg), SpO2 98%.  Wt Readings from Last 3 Encounters:  01/16/23 237 lb 12.8 oz (107.9 kg)  12/09/22 232 lb 9.6 oz (105.5 kg)  10/24/22 224 lb 6.4 oz (101.8 kg)    Body mass index is 31.37 kg/m.  Performance status (ECOG): 1 - Symptomatic but completely ambulatory  PHYSICAL EXAM:   Physical Exam Vitals and nursing note reviewed. Exam conducted with a chaperone present.  Constitutional:      Appearance: Normal appearance.  Cardiovascular:     Rate and Rhythm: Normal rate and regular rhythm.     Pulses: Normal pulses.     Heart sounds: Normal heart sounds.  Pulmonary:     Effort: Pulmonary effort is normal.     Breath sounds: Normal breath sounds.  Abdominal:     Palpations: Abdomen is soft. There is no hepatomegaly, splenomegaly or mass.     Tenderness: There is no abdominal tenderness.  Musculoskeletal:     Right lower leg: 2+ Edema present.     Left lower leg: 2+ Edema present.  Lymphadenopathy:     Cervical: No cervical adenopathy.     Right cervical: No superficial, deep or posterior cervical adenopathy.    Left cervical: No superficial, deep or posterior cervical adenopathy.     Upper Body:     Right upper body: No supraclavicular or axillary adenopathy.     Left upper body: No supraclavicular or axillary adenopathy.  Neurological:     General: No focal deficit present.     Mental Status: He is alert and oriented to person, place, and time.  Psychiatric:        Mood and Affect: Mood normal.        Behavior: Behavior normal.     LABS:      Latest Ref Rng & Units 01/16/2023   11:55 AM 12/23/2022   11:05 AM 10/17/2022   10:29 AM  CBC  WBC 4.0 - 10.5 K/uL 12.8  15.9  18.2   Hemoglobin 13.0 - 17.0 g/dL 25.3  66.4  40.3   Hematocrit 39.0 - 52.0 % 46.0  45.2  40.1   Platelets 150 - 400 K/uL 240  251  377       Latest Ref Rng & Units 08/05/2022    9:33 AM 06/17/2022    9:33 AM 05/17/2022    1:48 PM  CMP  Glucose 70 - 99  mg/dL  474  259   BUN 8 - 23 mg/dL  30  41   Creatinine 5.63 - 1.24 mg/dL  8.75  6.43   Sodium 329 - 145 mmol/L  136  133   Potassium 3.5 - 5.1 mmol/L  4.2  4.4   Chloride 98 - 111 mmol/L  99  101   CO2 22 - 32 mmol/L  30  24   Calcium 8.9 - 10.3 mg/dL  9.5  8.9   Total Protein 6.5 - 8.1 g/dL 6.2  6.9  5.5   Total Bilirubin 0.3 - 1.2 mg/dL 1.4  1.3  1.2  Alkaline Phos 38 - 126 U/L 58  61  60   AST 15 - 41 U/L 19  20  20    ALT 0 - 44 U/L 35  35  41      No results found for: "CEA1", "CEA" / No results found for: "CEA1", "CEA" No results found for: "PSA1" No results found for: "GNF621" No results found for: "CAN125"  Lab Results  Component Value Date   TOTALPROTELP 6.1 09/15/2015   Lab Results  Component Value Date   FERRITIN 138 03/25/2016   Lab Results  Component Value Date   LDH 237 (H) 01/16/2023   LDH 247 (H) 12/23/2022   LDH 282 (H) 10/17/2022     STUDIES:   No results found.

## 2023-01-15 ENCOUNTER — Inpatient Hospital Stay: Payer: Medicare Other | Admitting: Hematology

## 2023-01-16 ENCOUNTER — Inpatient Hospital Stay: Payer: Medicare Other | Attending: Hematology | Admitting: Hematology

## 2023-01-16 ENCOUNTER — Inpatient Hospital Stay: Payer: Medicare Other

## 2023-01-16 VITALS — BP 152/75 | HR 80 | Temp 97.5°F | Resp 20 | Wt 237.8 lb

## 2023-01-16 DIAGNOSIS — D591 Autoimmune hemolytic anemia, unspecified: Secondary | ICD-10-CM | POA: Insufficient documentation

## 2023-01-16 DIAGNOSIS — Z87891 Personal history of nicotine dependence: Secondary | ICD-10-CM | POA: Diagnosis not present

## 2023-01-16 DIAGNOSIS — Z79899 Other long term (current) drug therapy: Secondary | ICD-10-CM | POA: Insufficient documentation

## 2023-01-16 LAB — CBC WITH DIFFERENTIAL/PLATELET
Abs Immature Granulocytes: 0.09 10*3/uL — ABNORMAL HIGH (ref 0.00–0.07)
Basophils Absolute: 0.1 10*3/uL (ref 0.0–0.1)
Basophils Relative: 1 %
Eosinophils Absolute: 0.3 10*3/uL (ref 0.0–0.5)
Eosinophils Relative: 2 %
HCT: 46 % (ref 39.0–52.0)
Hemoglobin: 14.6 g/dL (ref 13.0–17.0)
Immature Granulocytes: 1 %
Lymphocytes Relative: 17 %
Lymphs Abs: 2.2 10*3/uL (ref 0.7–4.0)
MCH: 30 pg (ref 26.0–34.0)
MCHC: 31.7 g/dL (ref 30.0–36.0)
MCV: 94.5 fL (ref 80.0–100.0)
Monocytes Absolute: 1.1 10*3/uL — ABNORMAL HIGH (ref 0.1–1.0)
Monocytes Relative: 9 %
Neutro Abs: 9.1 10*3/uL — ABNORMAL HIGH (ref 1.7–7.7)
Neutrophils Relative %: 70 %
Platelets: 240 10*3/uL (ref 150–400)
RBC: 4.87 MIL/uL (ref 4.22–5.81)
RDW: 15.6 % — ABNORMAL HIGH (ref 11.5–15.5)
WBC: 12.8 10*3/uL — ABNORMAL HIGH (ref 4.0–10.5)
nRBC: 0 % (ref 0.0–0.2)

## 2023-01-16 LAB — LACTATE DEHYDROGENASE: LDH: 237 U/L — ABNORMAL HIGH (ref 98–192)

## 2023-01-16 LAB — RETICULOCYTES
Immature Retic Fract: 20.9 % — ABNORMAL HIGH (ref 2.3–15.9)
RBC.: 4.74 MIL/uL (ref 4.22–5.81)
Retic Count, Absolute: 128.5 10*3/uL (ref 19.0–186.0)
Retic Ct Pct: 2.7 % (ref 0.4–3.1)

## 2023-01-16 NOTE — Patient Instructions (Signed)
[facility] at [facility] Discharge Instructions   You were seen and examined today by Dr. Ellin Saba.  He reviewed the results of your lab work which are normal/stable.   We will repeat lab work today.   We will see you back in 4 months. We will repeat lab work prior to this visit.      Thank you for choosing [facility] at [facility] to provide your oncology and hematology care.  To afford each patient quality time with our provider, please arrive at least 15 minutes before your scheduled appointment time.   If you have a lab appointment with the Cancer Center please come in thru the Main Entrance and check in at the main information desk.  You need to re-schedule your appointment should you arrive 10 or more minutes late.  We strive to give you quality time with our providers, and arriving late affects you and other patients whose appointments are after yours.  Also, if you no show three or more times for appointments you may be dismissed from the clinic at the providers discretion.     Again, thank you for choosing [facility].  Our hope is that these requests will decrease the amount of time that you wait before being seen by our physicians.       _____________________________________________________________  Should you have questions after your visit to [facility], please contact our office at [phone] and follow the prompts.  Our office hours are 8:00 a.m. and 4:30 p.m. Monday - Friday.  Please note that voicemails left after 4:00 p.m. may not be returned until the following business day.  We are closed weekends and major holidays.  You do have access to a nurse 24-7, just call the main number to the clinic [phone] and do not press any options, hold on the line and a nurse will answer the phone.    For prescription refill requests, have your pharmacy contact our office and allow 72 hours.    Due to  Covid, you will need to wear a mask upon entering the hospital. If you do not have a mask, a mask will be given to you at the Main Entrance upon arrival. For doctor visits, patients may have 1 support person age 94 or older with them. For treatment visits, patients can not have anyone with them due to social distancing guidelines and our immunocompromised population.

## 2023-01-29 ENCOUNTER — Other Ambulatory Visit: Payer: Self-pay | Admitting: Cardiology

## 2023-02-27 ENCOUNTER — Other Ambulatory Visit: Payer: Self-pay | Admitting: Hematology

## 2023-02-27 DIAGNOSIS — D591 Autoimmune hemolytic anemia, unspecified: Secondary | ICD-10-CM

## 2023-03-14 ENCOUNTER — Inpatient Hospital Stay: Payer: Medicare PPO | Attending: Hematology

## 2023-03-14 DIAGNOSIS — D591 Autoimmune hemolytic anemia, unspecified: Secondary | ICD-10-CM | POA: Insufficient documentation

## 2023-03-14 LAB — COMPREHENSIVE METABOLIC PANEL
ALT: 25 U/L (ref 0–44)
AST: 24 U/L (ref 15–41)
Albumin: 3.4 g/dL — ABNORMAL LOW (ref 3.5–5.0)
Alkaline Phosphatase: 51 U/L (ref 38–126)
Anion gap: 10 (ref 5–15)
BUN: 23 mg/dL (ref 8–23)
CO2: 28 mmol/L (ref 22–32)
Calcium: 9.5 mg/dL (ref 8.9–10.3)
Chloride: 103 mmol/L (ref 98–111)
Creatinine, Ser: 1.12 mg/dL (ref 0.61–1.24)
GFR, Estimated: >60 mL/min (ref >60)
Glucose, Bld: 157 mg/dL — ABNORMAL HIGH (ref 70–99)
Potassium: 4.3 mmol/L (ref 3.5–5.1)
Sodium: 141 mmol/L (ref 135–145)
Total Bilirubin: 1.4 mg/dL — ABNORMAL HIGH (ref 0.0–1.2)
Total Protein: 6.4 g/dL — ABNORMAL LOW (ref 6.5–8.1)

## 2023-03-14 LAB — CBC WITH DIFFERENTIAL/PLATELET
Abs Immature Granulocytes: 0.05 10*3/uL (ref 0.00–0.07)
Basophils Absolute: 0.1 10*3/uL (ref 0.0–0.1)
Basophils Relative: 1 %
Eosinophils Absolute: 0.2 10*3/uL (ref 0.0–0.5)
Eosinophils Relative: 2 %
HCT: 44.2 % (ref 39.0–52.0)
Hemoglobin: 14.8 g/dL (ref 13.0–17.0)
Immature Granulocytes: 0 %
Lymphocytes Relative: 18 %
Lymphs Abs: 2.1 10*3/uL (ref 0.7–4.0)
MCH: 30.9 pg (ref 26.0–34.0)
MCHC: 33.5 g/dL (ref 30.0–36.0)
MCV: 92.3 fL (ref 80.0–100.0)
Monocytes Absolute: 1.1 10*3/uL — ABNORMAL HIGH (ref 0.1–1.0)
Monocytes Relative: 9 %
Neutro Abs: 8.2 10*3/uL — ABNORMAL HIGH (ref 1.7–7.7)
Neutrophils Relative %: 70 %
Platelets: 242 10*3/uL (ref 150–400)
RBC: 4.79 MIL/uL (ref 4.22–5.81)
RDW: 15.6 % — ABNORMAL HIGH (ref 11.5–15.5)
WBC: 11.7 10*3/uL — ABNORMAL HIGH (ref 4.0–10.5)
nRBC: 0 % (ref 0.0–0.2)

## 2023-03-14 LAB — RETICULOCYTES
Immature Retic Fract: 20.4 % — ABNORMAL HIGH (ref 2.3–15.9)
RBC.: 4.95 MIL/uL (ref 4.22–5.81)
Retic Count, Absolute: 117.8 10*3/uL (ref 19.0–186.0)
Retic Ct Pct: 2.4 % (ref 0.4–3.1)

## 2023-03-14 LAB — LACTATE DEHYDROGENASE: LDH: 254 U/L — ABNORMAL HIGH (ref 98–192)

## 2023-03-14 LAB — SAMPLE TO BLOOD BANK

## 2023-03-21 ENCOUNTER — Encounter: Payer: Self-pay | Admitting: Oncology

## 2023-03-21 ENCOUNTER — Inpatient Hospital Stay: Payer: Medicare PPO | Attending: Oncology | Admitting: Oncology

## 2023-03-21 VITALS — BP 144/69 | HR 77 | Temp 97.5°F | Resp 20 | Wt 238.5 lb

## 2023-03-21 DIAGNOSIS — E119 Type 2 diabetes mellitus without complications: Secondary | ICD-10-CM | POA: Diagnosis not present

## 2023-03-21 DIAGNOSIS — R609 Edema, unspecified: Secondary | ICD-10-CM | POA: Insufficient documentation

## 2023-03-21 DIAGNOSIS — Z79899 Other long term (current) drug therapy: Secondary | ICD-10-CM | POA: Insufficient documentation

## 2023-03-21 DIAGNOSIS — Z7984 Long term (current) use of oral hypoglycemic drugs: Secondary | ICD-10-CM | POA: Diagnosis not present

## 2023-03-21 DIAGNOSIS — D591 Autoimmune hemolytic anemia, unspecified: Secondary | ICD-10-CM | POA: Diagnosis present

## 2023-03-21 MED ORDER — PREDNISONE 10 MG PO TABS: ORAL_TABLET | ORAL | 1 refills | Status: DC

## 2023-03-21 NOTE — Addendum Note (Signed)
 Addended by: Charlton Cooler E on: 03/21/2023 10:45 AM   Modules accepted: Orders

## 2023-03-21 NOTE — Progress Notes (Addendum)
 [facility] [address], [place] [address]    Clinic Day:  03/21/2023  Referring physician: [name], MD  Patient Care Team: [name], MD as PCP - General (Internal Medicine) Debera Jayson MATSU, MD as PCP - Cardiology (Cardiology) Kristie Lamprey, MD as Consulting Physician (Gastroenterology) Debera Jayson MATSU, MD as Consulting Physician (Cardiology)   ASSESSMENT & PLAN:   Assessment: 1.  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 - Rituximab weekly x4 in June 2015. - He is on prednisone  5 mg daily, most recent taper was in May 2019 from 10 mg daily. -He took 5 mg daily for a long time.  Prednisone  was completely tapered off in November 2021. 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   2.  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  He is on metformin and Actos.    Plan: 1.  Autoimmune hemolytic anemia: - Prednisone  was tapered to 20 mg alternating with 10 mg daily on 10/24/2022 and further decrease to 10 mg daily on 01/16/2023.  -Plan is to slowly taper him down and likely maintain on 5 to 10 mg of prednisone  to prevent relapse. -Reviewed labs from 03/14/2023 which show a hemoglobin of 14.8.  White blood cell count 11.7.  Platelet count 242.  LDH 254. -Discussed rotating 10 mg with 5 mg for the next 2 months and will likely decrease down to 5 mg daily to prevent relapse. - RTC 2 months for follow-up with repeat labs.   2.  Leg swellings: - He has 2+ edema bilaterally.  Continue Lasix  daily as needed.   Orders Placed This Encounter  Procedures   CBC with Differential/Platelet    Weekly labs    Standing Status:   Standing    Number of Occurrences:   5    Expiration Date:   05/19/2023     Release to patient:   Immediate [1]   Comprehensive metabolic panel    Weekly labs    Standing Status:   Standing    Number of Occurrences:   5    Expiration Date:   05/19/2023    Release to patient:   Immediate [1]   Lactate dehydrogenase    Weekly labs    Standing Status:   Standing    Number of Occurrences:   5    Expiration Date:   05/19/2023   Reticulocytes    Weekly labs    Standing Status:   Standing    Number of Occurrences:   5    Expiration Date:   05/19/2023   Sample to Blood Bank    Weekly labs    Standing Status:   Standing    Number of Occurrences:   5    Expiration Date:   05/19/2023    Delon FORBES Hope, NP   2/7/202510:45 AM  CHIEF COMPLAINT:   Diagnosis: autoimmune hemolytic anemia    Cancer Staging  No matching staging information was found for the patient.    Prior Therapy: 1. High dose prednisone  in 09/2012. 2. Rituximab weekly x 4 in 07/2013. 3. Prednisone  taper through 12/2019.  Current Therapy:  prednisone  taper, currently 20 mg    HISTORY OF PRESENT ILLNESS:   Oncology  History   No history exists.     INTERVAL HISTORY:   [name] Page is a 78 y.o. male presenting to clinic today for follow up of autoimmune hemolytic anemia. He was last seen by me on 08/12/22. He was seen by Dr. Rogers on 01/16/2023.  He is slowly been tapering off of his prednisone .  At his last visit, he was asked to decrease to 10 mg daily from alternation 20 mg with 10 mg each day.  Overall, he reports doing well.  His appetite is 100% energy levels are 80%.  He has chronic shortness of breath and numbness and burning in bilateral lower extremities.  He continues to have some swelling although this has improved.  He is on a diuretic.  No additional cellulitis.  No recent hospitalizations, surgeries or changes to his baseline health.   PAST MEDICAL HISTORY:   Past Medical History: Past Medical History:  Diagnosis Date   Coronary atherosclerosis of native coronary artery     Multivessel, LVEF 59%   Essential hypertension    Hemolytic anemia (HCC)    Hyperlipidemia    Pernicious anemia     Surgical History: Past Surgical History:  Procedure Laterality Date   CORONARY ARTERY BYPASS GRAFT  2004   LIMA to LAD, SVG to OM, SVG to PDA    Social History: Social History   Socioeconomic History   Marital status: Widowed    Spouse name: Not on file   Number of children: Not on file   Years of education: Not on file   Highest education level: Not on file  Occupational History   Occupation: Full time  Tobacco Use   Smoking status: Former    Current packs/day: 0.00    Types: Cigarettes    Quit date: 02/11/2002    Years since quitting: 21.1   Smokeless tobacco: Never  Substance and Sexual Activity   Alcohol use: No    Alcohol/week: 0.0 standard drinks of alcohol   Drug use: No   Sexual activity: Not on file  Other Topics Concern   Not on file  Social History Narrative   Not on file   Social Drivers of Health   Financial Resource Strain: Low Risk  (12/20/2019)   Overall Financial Resource Strain (CARDIA)    Difficulty of Paying Living Expenses: Not hard at all  Food Insecurity: No Food Insecurity (12/20/2019)   Hunger Vital Sign    Worried About Running Out of Food in the Last Year: Never true    Ran Out of Food in the Last Year: Never true  Transportation Needs: No Transportation Needs (12/20/2019)   PRAPARE - Administrator, Civil Service (Medical): No    Lack of Transportation (Non-Medical): No  Physical Activity: Inactive (12/20/2019)   Exercise Vital Sign    Days of Exercise per Week: 0 days    Minutes of Exercise per Session: 0 min  Stress: No Stress Concern Present (12/20/2019)   [employer] of Occupational Health - Occupational Stress Questionnaire    Feeling of Stress : Not at all  Social Connections: Moderately Integrated (12/20/2019)   Social Connection and Isolation Panel [NHANES]    Frequency of Communication with  Friends and Family: More than three times a week    Frequency of Social Gatherings with Friends and Family: Once a week    Attends Religious Services: More than 4 times per year    Active Member of [employer] or Organizations: Yes    Attends Banker  Meetings: Never    Marital Status: Widowed  Intimate Partner Violence: Not At Risk (12/20/2019)   Humiliation, Afraid, Rape, and Kick questionnaire    Fear of Current or Ex-Partner: No    Emotionally Abused: No    Physically Abused: No    Sexually Abused: No    Family History: Family History  Problem Relation Age of Onset   Cancer Mother        colon   COPD Father    Ulcerative colitis Father    Heart attack Father     Current Medications:  Current Outpatient Medications:    APPLE CIDER VINEGAR PO, Take 30 mLs by mouth daily., Disp: , Rfl:    aspirin  EC 81 MG tablet, Take 81 mg by mouth daily., Disp: , Rfl:    b complex vitamins tablet, Take 1 tablet by mouth daily., Disp: , Rfl:    calcium carbonate (OS-CAL) 600 MG TABS tablet, Take 600 mg by mouth 2 (two) times daily with a meal. , Disp: , Rfl:    Cranberry 500 MG CAPS, Take 2 capsules by mouth daily. , Disp: , Rfl:    erythromycin ophthalmic ointment, SMARTSIG:In Eye(s), Disp: , Rfl:    FIASP FLEXTOUCH 100 UNIT/ML FlexTouch Pen, Inject 5 Units into the skin 3 (three) times daily before meals., Disp: , Rfl:    folic acid  (FOLVITE ) 1 MG tablet, take 1 tablet by mouth once daily., Disp: 30 tablet, Rfl: 6   furosemide  (LASIX ) 40 MG tablet, Take 40 mg by mouth daily., Disp: , Rfl:    GARLIC OIL PO, Take 1 capsule by mouth daily., Disp: , Rfl:    levothyroxine (SYNTHROID) 75 MCG tablet, Take 75 mcg by mouth daily., Disp: , Rfl:    losartan  (COZAAR ) 50 MG tablet, take 1 tablet (50 MILLIGRAM total) by mouth daily., Disp: 90 tablet, Rfl: 0   Magnesium 250 MG TABS, Take 1 tablet by mouth daily., Disp: , Rfl:    metFORMIN (GLUCOPHAGE) 500 MG tablet, , Disp: , Rfl:    metoprolol  succinate (TOPROL-XL) 50 MG 24 hr tablet, Take 25 mg by mouth daily., Disp: , Rfl:    Misc Natural Products (PROSTATE CONTROL PO), Take 1 tablet by mouth daily., Disp: , Rfl:    Multiple Vitamins-Minerals (MULTI FOR HIM 50+) TABS, Take 1 tablet by mouth daily. , Disp: , Rfl:    NOVOLOG FLEXPEN 100 UNIT/ML FlexPen, Inject 20 Units into the skin., Disp: , Rfl:    Omega 3-6-9 CAPS, Take 1 capsule by mouth daily. , Disp: , Rfl:    ONETOUCH VERIO test strip, , Disp: , Rfl:    psyllium (METAMUCIL) 58.6 % powder, Take 1 packet by mouth daily. , Disp: , Rfl:    Red Yeast Rice 600 MG TABS, Take 1 tablet by mouth daily. , Disp: , Rfl:    rosuvastatin (CRESTOR) 5 MG tablet, Take 5 mg by mouth once a week., Disp: , Rfl:    Saw Palmetto 160 MG CAPS, Take 1 capsule by mouth daily. , Disp: , Rfl:    TRESIBA FLEXTOUCH 100 UNIT/ML FlexTouch Pen, Inject 20 Units into the skin at bedtime., Disp: , Rfl:    vitamin B-12 (CYANOCOBALAMIN) 1000 MCG tablet, Take 1,000 mcg by mouth daily., Disp: , Rfl:    predniSONE  (DELTASONE ) 10 MG tablet, Rotate taking 1 tab (10 mg) with 0.5 tab (5 mg) each day until you are seen in clinic in 2 months., Disp: 120 tablet, Rfl: 1   Allergies:  No Known Allergies  REVIEW OF SYSTEMS:   Review of Systems  Respiratory:  Positive for shortness of breath.   Cardiovascular:  Positive for leg swelling.  Neurological:  Positive for numbness.     VITALS:   Blood pressure (!) 144/69, pulse 77, temperature (!) 97.5 F (36.4 C), temperature source Oral, resp. rate 20, weight 238 lb 8.6 oz (108.2 kg), SpO2 99%.  Wt Readings from Last 3 Encounters:  03/21/23 238 lb 8.6 oz (108.2 kg)  01/16/23 237 lb 12.8 oz (107.9 kg)  12/09/22 232 lb 9.6 oz (105.5 kg)    Body mass index is 31.47 kg/m.  Performance status (ECOG): 1 - Symptomatic but completely ambulatory  PHYSICAL EXAM:   Physical Exam Constitutional:      Appearance: Normal appearance.  Cardiovascular:     Rate and Rhythm:  Normal rate and regular rhythm.  Pulmonary:     Effort: Pulmonary effort is normal.     Breath sounds: Normal breath sounds.  Abdominal:     General: Bowel sounds are normal.     Palpations: Abdomen is soft.  Musculoskeletal:        General: No swelling. Normal range of motion.     Right lower leg: Edema present.     Left lower leg: Edema present.  Neurological:     Mental Status: He is alert and oriented to person, place, and time. Mental status is at baseline.     LABS:      Latest Ref Rng & Units 03/14/2023   10:51 AM 01/16/2023   11:55 AM 12/23/2022   11:05 AM  CBC  WBC 4.0 - 10.5 K/uL 11.7  12.8  15.9   Hemoglobin 13.0 - 17.0 g/dL 85.1  85.3  84.9   Hematocrit 39.0 - 52.0 % 44.2  46.0  45.2   Platelets 150 - 400 K/uL 242  240  251       Latest Ref Rng & Units 03/14/2023   10:51 AM 08/05/2022    9:33 AM 06/17/2022    9:33 AM  CMP  Glucose 70 - 99 mg/dL 842   817   BUN 8 - 23 mg/dL 23   30   Creatinine 9.38 - 1.24 mg/dL 8.87   8.94   Sodium 864 - 145 mmol/L 141   136   Potassium 3.5 - 5.1 mmol/L 4.3   4.2   Chloride 98 - 111 mmol/L 103   99   CO2 22 - 32 mmol/L 28   30   Calcium 8.9 - 10.3 mg/dL 9.5   9.5   Total Protein 6.5 - 8.1 g/dL 6.4  6.2  6.9   Total Bilirubin 0.0 - 1.2 mg/dL 1.4  1.4  1.3   Alkaline Phos 38 - 126 U/L 51  58  61   AST 15 - 41 U/L 24  19  20    ALT 0 - 44 U/L 25  35  35      No results found for: CEA1, CEA / No results found for: CEA1, CEA No results found for: PSA1 No results found for: CAN199 No results found for: RJW874  Lab Results  Component Value Date   TOTALPROTELP 6.1 09/15/2015   Lab Results  Component Value Date   FERRITIN 138 03/25/2016   Lab Results  Component Value Date   LDH 254 (H) 03/14/2023   LDH 237 (H) 01/16/2023   LDH 247 (H) 12/23/2022     STUDIES:   No results found.

## 2023-05-05 ENCOUNTER — Other Ambulatory Visit: Payer: Self-pay | Admitting: Cardiology

## 2023-05-11 DIAGNOSIS — E1165 Type 2 diabetes mellitus with hyperglycemia: Secondary | ICD-10-CM | POA: Diagnosis not present

## 2023-05-29 ENCOUNTER — Other Ambulatory Visit: Payer: Self-pay

## 2023-05-29 DIAGNOSIS — D591 Autoimmune hemolytic anemia, unspecified: Secondary | ICD-10-CM

## 2023-06-02 ENCOUNTER — Inpatient Hospital Stay: Payer: Medicare PPO | Attending: Oncology

## 2023-06-02 DIAGNOSIS — D591 Autoimmune hemolytic anemia, unspecified: Secondary | ICD-10-CM | POA: Diagnosis present

## 2023-06-02 DIAGNOSIS — Z87891 Personal history of nicotine dependence: Secondary | ICD-10-CM | POA: Insufficient documentation

## 2023-06-02 DIAGNOSIS — E119 Type 2 diabetes mellitus without complications: Secondary | ICD-10-CM | POA: Diagnosis not present

## 2023-06-02 DIAGNOSIS — Z79899 Other long term (current) drug therapy: Secondary | ICD-10-CM | POA: Insufficient documentation

## 2023-06-02 LAB — CBC WITH DIFFERENTIAL/PLATELET
Abs Immature Granulocytes: 0.05 10*3/uL (ref 0.00–0.07)
Basophils Absolute: 0.1 10*3/uL (ref 0.0–0.1)
Basophils Relative: 1 %
Eosinophils Absolute: 0.4 10*3/uL (ref 0.0–0.5)
Eosinophils Relative: 4 %
HCT: 40.8 % (ref 39.0–52.0)
Hemoglobin: 13.5 g/dL (ref 13.0–17.0)
Immature Granulocytes: 1 %
Lymphocytes Relative: 16 %
Lymphs Abs: 1.4 10*3/uL (ref 0.7–4.0)
MCH: 31.6 pg (ref 26.0–34.0)
MCHC: 33.1 g/dL (ref 30.0–36.0)
MCV: 95.6 fL (ref 80.0–100.0)
Monocytes Absolute: 0.9 10*3/uL (ref 0.1–1.0)
Monocytes Relative: 10 %
Neutro Abs: 6.3 10*3/uL (ref 1.7–7.7)
Neutrophils Relative %: 68 %
Platelets: 245 10*3/uL (ref 150–400)
RBC: 4.27 MIL/uL (ref 4.22–5.81)
RDW: 16.6 % — ABNORMAL HIGH (ref 11.5–15.5)
WBC: 9 10*3/uL (ref 4.0–10.5)
nRBC: 0 % (ref 0.0–0.2)

## 2023-06-02 LAB — COMPREHENSIVE METABOLIC PANEL WITH GFR
ALT: 24 U/L (ref 0–44)
AST: 27 U/L (ref 15–41)
Albumin: 3.6 g/dL (ref 3.5–5.0)
Alkaline Phosphatase: 72 U/L (ref 38–126)
Anion gap: 6 (ref 5–15)
BUN: 15 mg/dL (ref 8–23)
CO2: 29 mmol/L (ref 22–32)
Calcium: 9 mg/dL (ref 8.9–10.3)
Chloride: 106 mmol/L (ref 98–111)
Creatinine, Ser: 1.02 mg/dL (ref 0.61–1.24)
GFR, Estimated: >60 mL/min (ref >60)
Glucose, Bld: 130 mg/dL — ABNORMAL HIGH (ref 70–99)
Potassium: 4.4 mmol/L (ref 3.5–5.1)
Sodium: 141 mmol/L (ref 135–145)
Total Bilirubin: 1.4 mg/dL — ABNORMAL HIGH (ref 0.0–1.2)
Total Protein: 6.6 g/dL (ref 6.5–8.1)

## 2023-06-02 LAB — RETICULOCYTES
Immature Retic Fract: 23.1 % — ABNORMAL HIGH (ref 2.3–15.9)
RBC.: 4.25 MIL/uL (ref 4.22–5.81)
Retic Count, Absolute: 131.3 10*3/uL (ref 19.0–186.0)
Retic Ct Pct: 3.1 % (ref 0.4–3.1)

## 2023-06-02 LAB — LACTATE DEHYDROGENASE: LDH: 249 U/L — ABNORMAL HIGH (ref 98–192)

## 2023-06-02 LAB — SAMPLE TO BLOOD BANK

## 2023-06-09 ENCOUNTER — Inpatient Hospital Stay (HOSPITAL_BASED_OUTPATIENT_CLINIC_OR_DEPARTMENT_OTHER): Payer: Medicare PPO | Admitting: Hematology

## 2023-06-09 VITALS — BP 152/82 | HR 74 | Temp 97.5°F | Resp 16 | Wt 237.9 lb

## 2023-06-09 DIAGNOSIS — D591 Autoimmune hemolytic anemia, unspecified: Secondary | ICD-10-CM

## 2023-06-09 NOTE — Patient Instructions (Addendum)
 [facility] at [facility] Discharge Instructions   You were seen and examined today by Dr. Ellin Saba.  He reviewed the results of your lab work which are normal/stable.   We will see you back in 3 months. We will repeat lab work prior to this visit.    Return as scheduled.    Thank you for choosing [facility] at [facility] to provide your oncology and hematology care.  To afford each patient quality time with our provider, please arrive at least 15 minutes before your scheduled appointment time.   If you have a lab appointment with the Cancer Center please come in thru the Main Entrance and check in at the main information desk.  You need to re-schedule your appointment should you arrive 10 or more minutes late.  We strive to give you quality time with our providers, and arriving late affects you and other patients whose appointments are after yours.  Also, if you no show three or more times for appointments you may be dismissed from the clinic at the providers discretion.     Again, thank you for choosing [facility].  Our hope is that these requests will decrease the amount of time that you wait before being seen by our physicians.       _____________________________________________________________  Should you have questions after your visit to [facility], please contact our office at [phone] and follow the prompts.  Our office hours are 8:00 a.m. and 4:30 p.m. Monday - Friday.  Please note that voicemails left after 4:00 p.m. may not be returned until the following business day.  We are closed weekends and major holidays.  You do have access to a nurse 24-7, just call the main number to the clinic [phone] and do not press any options, hold on the line and a nurse will answer the phone.    For prescription refill requests, have your pharmacy contact our office and allow 72 hours.    Due to Covid, you  will need to wear a mask upon entering the hospital. If you do not have a mask, a mask will be given to you at the Main Entrance upon arrival. For doctor visits, patients may have 1 support person age 55 or older with them. For treatment visits, patients can not have anyone with them due to social distancing guidelines and our immunocompromised population.

## 2023-06-09 NOTE — Progress Notes (Signed)
 [facility] [address] [place] [address]    Clinic Day:  06/09/2023  Referring physician: Orlena Bitters, MD  Patient Care Team: Orlena Bitters, MD as PCP - General (Internal Medicine) Gerard Knight, MD as PCP - Cardiology (Cardiology) Tami Falcon, MD as Consulting Physician (Gastroenterology) Gerard Knight, MD as Consulting Physician (Cardiology)   ASSESSMENT & PLAN:   Assessment: 1.  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 - Rituximab weekly x4 in June 2015. - He is on prednisone  5 mg daily, most recent taper was in May 2019 from 10 mg daily. -He took 5 mg daily for a long time.  Prednisone  was completely tapered off in November 2021. 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   2.  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  He is on metformin and Actos.    Plan: 1.  Autoimmune hemolytic anemia: - Prednisone  decreased to 10 mg on 01/16/2023 and on 03/21/2023, it was further decreased to 5 mg daily. - Reviewed labs today: Normal LFTs with total bilirubin 1.4.  LDH 249 stable.  Hemoglobin 13.5 and reticulocyte count is 3%. - Continue prednisone  5 mg daily.  RTC 3 months for follow-up with repeat CBC and hemolysis labs.  Will also checked direct Coombs test.   2.  Leg swellings: - Swelling has improved to 1+.  Albumin is normal at 3.6.  He has cellulitis on and off.  I have instructed him to wear compression socks.  Orders Placed This Encounter  Procedures   CBC with Differential    Standing Status:   Future    Expected Date:   09/08/2023    Expiration Date:   06/08/2024   Reticulocytes    Standing Status:   Future    Expected Date:   09/08/2023    Expiration Date:   06/08/2024    Lactate dehydrogenase    Standing Status:   Future    Expected Date:   09/08/2023    Expiration Date:   06/08/2024   Hepatic function panel    Standing Status:   Future    Expected Date:   09/08/2023    Expiration Date:   06/08/2024   Direct antiglobulin test    Standing Status:   Future    Expected Date:   09/08/2023    Expiration Date:   06/08/2024      I,Katie Daubenspeck,acting as a scribe for Paulett Boros, MD.,have documented all relevant documentation on the behalf of Paulett Boros, MD,as directed by  Paulett Boros, MD while in the presence of Paulett Boros, MD.   I, Paulett Boros MD, have reviewed the above documentation for accuracy and completeness, and I agree with the above.   Paulett Boros, MD   4/28/202512:19 PM  CHIEF COMPLAINT:   Diagnosis: autoimmune hemolytic anemia    Cancer Staging  No matching staging information was found for the patient.    Prior Therapy: 1. High dose prednisone  in 09/2012. 2. Rituximab weekly x 4 in 07/2013. 3. Prednisone  taper through 12/2019.  Current Therapy:  prednisone  taper, currently 10 mg, restarted 03/2022   HISTORY OF PRESENT ILLNESS:   Oncology History   No history exists.     INTERVAL  HISTORY:   [name] Page is a 78 y.o. male presenting to clinic today for follow up of autoimmune hemolytic anemia. He was last seen by NP Bridgette Campus on 03/21/23.  Today, he states that he is doing well overall. His appetite level is at 75%. His energy level is at 50%.  PAST MEDICAL HISTORY:   Past Medical History: Past Medical History:  Diagnosis Date   Coronary atherosclerosis of native coronary artery    Multivessel, LVEF 59%   Essential hypertension    Hemolytic anemia (HCC)    Hyperlipidemia    Pernicious anemia     Surgical History: Past Surgical History:  Procedure Laterality Date   CORONARY ARTERY BYPASS GRAFT  2004   LIMA to LAD, SVG to OM, SVG to PDA    Social History: Social History    Socioeconomic History   Marital status: Widowed    Spouse name: Not on file   Number of children: Not on file   Years of education: Not on file   Highest education level: Not on file  Occupational History   Occupation: Full time  Tobacco Use   Smoking status: Former    Current packs/day: 0.00    Types: Cigarettes    Quit date: 02/11/2002    Years since quitting: 21.3   Smokeless tobacco: Never  Substance and Sexual Activity   Alcohol use: No    Alcohol/week: 0.0 standard drinks of alcohol   Drug use: No   Sexual activity: Not on file  Other Topics Concern   Not on file  Social History Narrative   Not on file   Social Drivers of Health   Financial Resource Strain: Low Risk  (12/20/2019)   Overall Financial Resource Strain (CARDIA)    Difficulty of Paying Living Expenses: Not hard at all  Food Insecurity: No Food Insecurity (12/20/2019)   Hunger Vital Sign    Worried About Running Out of Food in the Last Year: Never true    Ran Out of Food in the Last Year: Never true  Transportation Needs: No Transportation Needs (12/20/2019)   PRAPARE - Administrator, Civil Service (Medical): No    Lack of Transportation (Non-Medical): No  Physical Activity: Inactive (12/20/2019)   Exercise Vital Sign    Days of Exercise per Week: 0 days    Minutes of Exercise per Session: 0 min  Stress: No Stress Concern Present (12/20/2019)   [employer] of Occupational Health - Occupational Stress Questionnaire    Feeling of Stress : Not at all  Social Connections: Moderately Integrated (12/20/2019)   Social Connection and Isolation Panel [NHANES]    Frequency of Communication with Friends and Family: More than three times a week    Frequency of Social Gatherings with Friends and Family: Once a week    Attends Religious Services: More than 4 times per year    Active Member of [employer] or Organizations: Yes    Attends Banker Meetings: Never    Marital Status: Widowed   Intimate Partner Violence: Not At Risk (12/20/2019)   Humiliation, Afraid, Rape, and Kick questionnaire    Fear of Current or Ex-Partner: No    Emotionally Abused: No    Physically Abused: No    Sexually Abused: No    Family History: Family History  Problem Relation Age of Onset   Cancer Mother        colon   COPD Father    Ulcerative colitis Father    Heart attack Father  Current Medications:  Current Outpatient Medications:    APPLE CIDER VINEGAR PO, Take 30 mLs by mouth daily., Disp: , Rfl:    aspirin  EC 81 MG tablet, Take 81 mg by mouth daily., Disp: , Rfl:    b complex vitamins tablet, Take 1 tablet by mouth daily., Disp: , Rfl:    calcium carbonate (OS-CAL) 600 MG TABS tablet, Take 600 mg by mouth 2 (two) times daily with a meal. , Disp: , Rfl:    Cranberry 500 MG CAPS, Take 2 capsules by mouth daily. , Disp: , Rfl:    erythromycin ophthalmic ointment, SMARTSIG:In Eye(s), Disp: , Rfl:    FIASP FLEXTOUCH 100 UNIT/ML FlexTouch Pen, Inject 5 Units into the skin 3 (three) times daily before meals., Disp: , Rfl:    folic acid  (FOLVITE ) 1 MG tablet, take 1 tablet by mouth once daily., Disp: 30 tablet, Rfl: 6   furosemide  (LASIX ) 40 MG tablet, Take 40 mg by mouth daily., Disp: , Rfl:    GARLIC OIL PO, Take 1 capsule by mouth daily., Disp: , Rfl:    levothyroxine (SYNTHROID) 75 MCG tablet, Take 75 mcg by mouth daily., Disp: , Rfl:    losartan  (COZAAR ) 50 MG tablet, take 1 tablet (50 milligram total) by mouth daily., Disp: 90 tablet, Rfl: 2   Magnesium 250 MG TABS, Take 1 tablet by mouth daily., Disp: , Rfl:    metFORMIN (GLUCOPHAGE) 500 MG tablet, , Disp: , Rfl:    metoprolol succinate (TOPROL-XL) 50 MG 24 hr tablet, Take 25 mg by mouth daily., Disp: , Rfl:    Misc Natural Products (PROSTATE CONTROL PO), Take 1 tablet by mouth daily., Disp: , Rfl:    Multiple Vitamins-Minerals (MULTI FOR HIM 50+) TABS, Take 1 tablet by mouth daily. , Disp: , Rfl:    NOVOLOG FLEXPEN 100 UNIT/ML  FlexPen, Inject 20 Units into the skin., Disp: , Rfl:    Omega 3-6-9 CAPS, Take 1 capsule by mouth daily. , Disp: , Rfl:    ONETOUCH VERIO test strip, , Disp: , Rfl:    predniSONE  (DELTASONE ) 10 MG tablet, Rotate taking 1 tab (10 mg) with 0.5 tab (5 mg) each day until you are seen in clinic in 2 months., Disp: 120 tablet, Rfl: 1   psyllium (METAMUCIL) 58.6 % powder, Take 1 packet by mouth daily. , Disp: , Rfl:    Red Yeast Rice 600 MG TABS, Take 1 tablet by mouth daily. , Disp: , Rfl:    rosuvastatin (CRESTOR) 5 MG tablet, Take 5 mg by mouth once a week., Disp: , Rfl:    Saw Palmetto 160 MG CAPS, Take 1 capsule by mouth daily. , Disp: , Rfl:    TRESIBA FLEXTOUCH 100 UNIT/ML FlexTouch Pen, Inject 20 Units into the skin at bedtime., Disp: , Rfl:    vitamin B-12 (CYANOCOBALAMIN) 1000 MCG tablet, Take 1,000 mcg by mouth daily., Disp: , Rfl:    Allergies: No Known Allergies  REVIEW OF SYSTEMS:   Review of Systems  Constitutional:  Negative for chills, fatigue and fever.  HENT:   Negative for lump/mass, mouth sores, nosebleeds, sore throat and trouble swallowing.   Eyes:  Negative for eye problems.  Respiratory:  Positive for shortness of breath. Negative for cough.   Cardiovascular:  Negative for chest pain, leg swelling and palpitations.  Gastrointestinal:  Negative for abdominal pain, constipation, diarrhea, nausea and vomiting.  Genitourinary:  Negative for bladder incontinence, difficulty urinating, dysuria, frequency, hematuria and nocturia.   Musculoskeletal:  Negative for arthralgias, back pain, flank pain, myalgias and neck pain.  Skin:  Negative for itching and rash.  Neurological:  Positive for numbness. Negative for dizziness and headaches.  Hematological:  Does not bruise/bleed easily.  Psychiatric/Behavioral:  Negative for depression, sleep disturbance and suicidal ideas. The patient is not nervous/anxious.   All other systems reviewed and are negative.    VITALS:   Blood  pressure (!) 152/82, pulse 74, temperature (!) 97.5 F (36.4 C), temperature source Oral, resp. rate 16, weight 237 lb 14 oz (107.9 kg), SpO2 100%.  Wt Readings from Last 3 Encounters:  06/09/23 237 lb 14 oz (107.9 kg)  03/21/23 238 lb 8.6 oz (108.2 kg)  01/16/23 237 lb 12.8 oz (107.9 kg)    Body mass index is 31.38 kg/m.  Performance status (ECOG): 0 - Asymptomatic  PHYSICAL EXAM:   Physical Exam Vitals and nursing note reviewed. Exam conducted with a chaperone present.  Constitutional:      Appearance: Normal appearance.  Cardiovascular:     Rate and Rhythm: Normal rate and regular rhythm.     Pulses: Normal pulses.     Heart sounds: Normal heart sounds.  Pulmonary:     Effort: Pulmonary effort is normal.     Breath sounds: Normal breath sounds.  Abdominal:     Palpations: Abdomen is soft. There is no hepatomegaly, splenomegaly or mass.     Tenderness: There is no abdominal tenderness.  Musculoskeletal:     Right lower leg: Edema present.     Left lower leg: Edema present.  Lymphadenopathy:     Cervical: No cervical adenopathy.     Right cervical: No superficial, deep or posterior cervical adenopathy.    Left cervical: No superficial, deep or posterior cervical adenopathy.     Upper Body:     Right upper body: No supraclavicular or axillary adenopathy.     Left upper body: No supraclavicular or axillary adenopathy.  Neurological:     General: No focal deficit present.     Mental Status: He is alert and oriented to person, place, and time.  Psychiatric:        Mood and Affect: Mood normal.        Behavior: Behavior normal.     LABS:   CBC     Component Value Date/Time   WBC 9.0 06/02/2023 0957   RBC 4.27 06/02/2023 0957   RBC 4.25 06/02/2023 0956   HGB 13.5 06/02/2023 0957   HCT 40.8 06/02/2023 0957   PLT 245 06/02/2023 0957   MCV 95.6 06/02/2023 0957   MCH 31.6 06/02/2023 0957   MCHC 33.1 06/02/2023 0957   RDW 16.6 (H) 06/02/2023 0957   LYMPHSABS 1.4  06/02/2023 0957   MONOABS 0.9 06/02/2023 0957   EOSABS 0.4 06/02/2023 0957   BASOSABS 0.1 06/02/2023 0957    CMP      Component Value Date/Time   NA 141 06/02/2023 0957   K 4.4 06/02/2023 0957   CL 106 06/02/2023 0957   CO2 29 06/02/2023 0957   GLUCOSE 130 (H) 06/02/2023 0957   BUN 15 06/02/2023 0957   CREATININE 1.02 06/02/2023 0957   CALCIUM 9.0 06/02/2023 0957   PROT 6.6 06/02/2023 0957   ALBUMIN 3.6 06/02/2023 0957   AST 27 06/02/2023 0957   ALT 24 06/02/2023 0957   ALKPHOS 72 06/02/2023 0957   BILITOT 1.4 (H) 06/02/2023 0957   GFRNONAA >60 06/02/2023 0957   GFRAA >60 09/15/2019 1115     No  results found for: "CEA1", "CEA" / No results found for: "CEA1", "CEA" No results found for: "PSA1" No results found for: "FAO130" No results found for: "CAN125"  Lab Results  Component Value Date   TOTALPROTELP 6.1 09/15/2015   Lab Results  Component Value Date   FERRITIN 138 03/25/2016   Lab Results  Component Value Date   LDH 249 (H) 06/02/2023   LDH 254 (H) 03/14/2023   LDH 237 (H) 01/16/2023     STUDIES:   No results found.

## 2023-07-08 ENCOUNTER — Telehealth: Payer: Self-pay | Admitting: Cardiology

## 2023-07-08 ENCOUNTER — Telehealth: Payer: Self-pay

## 2023-07-08 NOTE — Telephone Encounter (Signed)
   Name: [name] Page  DOB: 11-09-1945  MRN: [id number]  Primary Cardiologist: Teddie Favre, MD   Preoperative team, please contact this patient and set up a phone call appointment for further preoperative risk assessment. Please obtain consent and complete medication review. Thank you for your help.  I confirm that guidance regarding antiplatelet and oral anticoagulation therapy has been completed and, if necessary, noted below.  Regarding ASA therapy, we recommend continuation of ASA throughout the perioperative period.  However, if the surgeon feels that cessation of ASA is required in the perioperative period, it may be stopped 5-7 days prior to surgery with a plan to resume it as soon as felt to be feasible from a surgical standpoint in the post-operative period.   I also confirmed the patient resides in the state of [place] . As per [facility] Medical Board telemedicine laws, the patient must reside in the state in which the provider is licensed.   Francene Ing, Retha Cast, NP 07/08/2023, 1:48 PM [facility]

## 2023-07-08 NOTE — Telephone Encounter (Signed)
 S/W pt and scheduled TELE Preop appt for 07/16/23.  Med Rec and consent done.

## 2023-07-08 NOTE — Telephone Encounter (Signed)
 Med Rec and consent done     Patient Consent for Virtual Visit        [name] has provided verbal consent on 07/08/2023 for a virtual visit (video or telephone).   CONSENT FOR VIRTUAL VISIT FOR:  [name] Page  By participating in this virtual visit I agree to the following:  I hereby voluntarily request, consent and authorize [facility] and its employed or contracted physicians, physician assistants, nurse practitioners or other licensed health care professionals (the Practitioner), to provide me with telemedicine health care services (the "Services") as deemed necessary by the treating Practitioner. I acknowledge and consent to receive the Services by the Practitioner via telemedicine. I understand that the telemedicine visit will involve communicating with the Practitioner through live audiovisual communication technology and the disclosure of certain medical information by electronic transmission. I acknowledge that I have been given the opportunity to request an in-person assessment or other available alternative prior to the telemedicine visit and am voluntarily participating in the telemedicine visit.  I understand that I have the right to withhold or withdraw my consent to the use of telemedicine in the course of my care at any time, without affecting my right to future care or treatment, and that the Practitioner or I may terminate the telemedicine visit at any time. I understand that I have the right to inspect all information obtained and/or recorded in the course of the telemedicine visit and may receive copies of available information for a reasonable fee.  I understand that some of the potential risks of receiving the Services via telemedicine include:  Delay or interruption in medical evaluation due to technological equipment failure or disruption; Information transmitted may not be sufficient (e.g. poor resolution of images) to allow for appropriate medical  decision making by the Practitioner; and/or  In rare instances, security protocols could fail, causing a breach of personal health information.  Furthermore, I acknowledge that it is my responsibility to provide information about my medical history, conditions and care that is complete and accurate to the best of my ability. I acknowledge that Practitioner's advice, recommendations, and/or decision may be based on factors not within their control, such as incomplete or inaccurate data provided by me or distortions of diagnostic images or specimens that may result from electronic transmissions. I understand that the practice of medicine is not an exact science and that Practitioner makes no warranties or guarantees regarding treatment outcomes. I acknowledge that a copy of this consent can be made available to me via my patient portal [facility] MyChart), or I can request a printed copy by calling the office of [facility].    I understand that my insurance will be billed for this visit.   I have read or had this consent read to me. I understand the contents of this consent, which adequately explains the benefits and risks of the Services being provided via telemedicine.  I have been provided ample opportunity to ask questions regarding this consent and the Services and have had my questions answered to my satisfaction. I give my informed consent for the services to be provided through the use of telemedicine in my medical care

## 2023-07-08 NOTE — Telephone Encounter (Signed)
   Pre-operative Risk Assessment    Patient Name: [name] Page  DOB: [birth year] MRN: [id number]      Request for Surgical Clearance    Procedure:  Colonoscopy with Propofol  Date of Surgery:  Clearance TBD                                 Surgeon:  Dr.Mann Surgeon's Group or Practice Name:  [facility] Phone number:  [phone] Fax number:  [phone]   Type of Clearance Requested:   - Medical  - Pharmacy:  Hold Please advise      Type of Anesthesia:  Not Indicated   Additional requests/questions:  Please advise surgeon/provider what medications should be held. Please fax a copy of Clearance to the surgeon's office.  Signed, Georgia Kipper   07/08/2023, 1:41 PM

## 2023-07-16 ENCOUNTER — Telehealth: Payer: Self-pay | Admitting: *Deleted

## 2023-07-16 ENCOUNTER — Ambulatory Visit: Attending: Cardiology

## 2023-07-16 DIAGNOSIS — Z01810 Encounter for preprocedural cardiovascular examination: Secondary | ICD-10-CM

## 2023-07-16 NOTE — Telephone Encounter (Signed)
 Will send message to scheduler to see if they can get the pt in sooner than 74/16/25 with Dr. Londa Rival.

## 2023-07-16 NOTE — Progress Notes (Signed)
   Name:  [name] Page  DOB:  Aug 08, 1945  MRN:  [id number]   Primary Cardiologist: Teddie Favre, MD  Chart reviewed as part of pre-operative protocol coverage. Patient was contacted 07/16/2023 in reference to pre-operative risk assessment for pending surgery as outlined below.  [name] Page was last seen on 12/09/22 by Dr. Londa Rival.  Patient was contacted today regarding preoperative cardiac evaluation.  Patient denies any chest pain however notes that he has had worsening fatigue and worsening shortness of breath since last being seen in office.  Discussed with patient that he currently has an appointment with Dr. Londa Rival in July, patient would prefer to delay colonoscopy until he can be evaluated in office.  Due to new or worsening symptoms, [name] Page will require a follow-up visit for further pre-operative risk assessment.  Pre-op covering staff: - Please schedule appointment and call patient to inform them. If patient already had an upcoming appointment within acceptable timeframe, please add "pre-op clearance" to the appointment notes so provider is aware.  Patient currently has an appointment with Dr. Londa Rival in July, however is requesting a sooner appointment if available or placement on cancellation list. - Please contact requesting surgeon's office via preferred method (i.e, phone, fax) to inform them of need for appointment prior to surgery.  Huckleberry Martinson D Isabellah Sobocinski, NP 07/16/2023, 2:16 PM

## 2023-07-16 NOTE — Telephone Encounter (Signed)
-----   Message from Katlyn D West sent at 07/16/2023  3:21 PM EDT ----- Patient has visit with Dr. Londa Rival in July, requests to be placed on wait list if no sooner appointment is available.

## 2023-07-21 NOTE — Telephone Encounter (Signed)
 Slaughter, Vicky T  Kamyia Thomason, Glee Lana, CMA; P Cv [facility] Scheduling; P Cv Zane Hews Scheduling Patient has been placed on the [employer].   See notes in chart pt needs in office. Pt has appt 08/27/23 with Dr. Londa Rival, trying to see if we can get sooner appt.

## 2023-08-27 ENCOUNTER — Ambulatory Visit: Attending: Cardiology | Admitting: Cardiology

## 2023-08-27 ENCOUNTER — Encounter: Payer: Self-pay | Admitting: Cardiology

## 2023-08-27 VITALS — BP 128/80 | HR 80 | Ht 73.0 in | Wt 238.0 lb

## 2023-08-27 DIAGNOSIS — I10 Essential (primary) hypertension: Secondary | ICD-10-CM | POA: Diagnosis not present

## 2023-08-27 DIAGNOSIS — E782 Mixed hyperlipidemia: Secondary | ICD-10-CM | POA: Insufficient documentation

## 2023-08-27 DIAGNOSIS — Z01810 Encounter for preprocedural cardiovascular examination: Secondary | ICD-10-CM | POA: Insufficient documentation

## 2023-08-27 DIAGNOSIS — I25119 Atherosclerotic heart disease of native coronary artery with unspecified angina pectoris: Secondary | ICD-10-CM | POA: Diagnosis not present

## 2023-08-27 NOTE — Patient Instructions (Addendum)

## 2023-08-27 NOTE — Progress Notes (Signed)
    Cardiology Office Note  Date: 08/27/2023   ID: Dontrel, Smethers [birth year], MRN [id number]  History of Present Illness: [name] Page is a 78 y.o. male last seen in October 2024.  He is here for a follow-up visit.  Reports stable dyspnea on exertion, generally NYHA class II, improved leg swelling overall now using Lasix  only as needed.  He does not report any angina.  He is planning to undergo colonoscopy with Dr. Kristie.  Discussed aspirin  hold 7 days prior to procedure to facilitate biopsies if necessary.  We went over his medications.  No change from a cardiac perspective.  His blood pressure is controlled today.  He continues to follow regularly with Dr. Rosamond for primary care.  Also follows with hematology.  His last hemoglobin was 13.5 in April.  Physical Exam: VS:  BP 128/80   Pulse 80   Ht 6' 1 (1.854 m)   Wt 238 lb (108 kg)   SpO2 99%   BMI 31.40 kg/m , BMI Body mass index is 31.4 kg/m.  Wt Readings from Last 3 Encounters:  08/27/23 238 lb (108 kg)  06/09/23 237 lb 14 oz (107.9 kg)  03/21/23 238 lb 8.6 oz (108.2 kg)    General: Patient appears comfortable at rest. HEENT: Conjunctiva and lids normal. Neck: Supple, no elevated JVP or carotid bruits. Lungs: Clear to auscultation, nonlabored breathing at rest. Cardiac: Regular rate and rhythm, no S3 or significant systolic murmur. Extremities: Ankle edema.  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  Labwork: 06/02/2023: ALT 24; AST 27; BUN 15; Creatinine, Ser 1.02; Hemoglobin 13.5; Platelets 245; Potassium 4.4; Sodium 141   Other Studies Reviewed Today:  No interval cardiac testing for review today.  Assessment and Plan:  1.  Multivessel CAD status post CABG in 2004 with LIMA to LAD, SVG to OM, and SVG to PDA.  Lexiscan  Myoview  in June 2020 revealed evidence of inferior/inferoseptal scar but  no active ischemia and LVEF 58%.  More recent follow-up echocardiogram done at [facility] in September 2024 indicated LVEF 55 to 60%.  He does not report any angina, still with dyspnea on exertion, NYHA class II and no acceleration.  For now continue medical therapy, we did discuss considering follow-up ischemic testing depending on symptom progression.  Currently on aspirin  81 mg daily and Crestor 5 mg weekly.  2.  Primary hypertension.  Good blood pressure control today.  Continue Cozaar  50 mg daily and Toprol-XL 25 mg daily.   3.  Mixed hyperlipidemia on Crestor.  LDL 57 in February 2024.  Continue Crestor 5 mg weekly.   4.  Autoimmune hemolytic anemia, following with hematology.  Hemoglobin 13.5 in April.  He remains on low-dose prednisone .  5.  Planning outpatient colonoscopy per Dr. Kristie.  Should be able to proceed at acceptable cardiovascular risk, may hold aspirin  7 days prior to facilitate biopsy if necessary.  Disposition:  Follow up 6 months.  Signed, Jayson JUDITHANN Sierras, M.D., F.A.C.C. [facility]

## 2023-09-04 ENCOUNTER — Inpatient Hospital Stay: Attending: Oncology

## 2023-09-04 DIAGNOSIS — Z87891 Personal history of nicotine dependence: Secondary | ICD-10-CM | POA: Insufficient documentation

## 2023-09-04 DIAGNOSIS — D591 Autoimmune hemolytic anemia, unspecified: Secondary | ICD-10-CM | POA: Insufficient documentation

## 2023-09-04 LAB — HEPATIC FUNCTION PANEL
ALT: 26 U/L (ref 0–44)
AST: 33 U/L (ref 15–41)
Albumin: 3.7 g/dL (ref 3.5–5.0)
Alkaline Phosphatase: 69 U/L (ref 38–126)
Bilirubin, Direct: 0.4 mg/dL — ABNORMAL HIGH (ref 0.0–0.2)
Indirect Bilirubin: 2.4 mg/dL — ABNORMAL HIGH (ref 0.3–0.9)
Total Bilirubin: 2.8 mg/dL — ABNORMAL HIGH (ref 0.0–1.2)
Total Protein: 6.8 g/dL (ref 6.5–8.1)

## 2023-09-04 LAB — CBC WITH DIFFERENTIAL/PLATELET
Abs Immature Granulocytes: 0.05 K/uL (ref 0.00–0.07)
Basophils Absolute: 0.1 K/uL (ref 0.0–0.1)
Basophils Relative: 1 %
Eosinophils Absolute: 0.2 K/uL (ref 0.0–0.5)
Eosinophils Relative: 2 %
HCT: 34.6 % — ABNORMAL LOW (ref 39.0–52.0)
Hemoglobin: 11 g/dL — ABNORMAL LOW (ref 13.0–17.0)
Immature Granulocytes: 1 %
Lymphocytes Relative: 15 %
Lymphs Abs: 1.5 K/uL (ref 0.7–4.0)
MCH: 36.4 pg — ABNORMAL HIGH (ref 26.0–34.0)
MCHC: 31.8 g/dL (ref 30.0–36.0)
MCV: 114.6 fL — ABNORMAL HIGH (ref 80.0–100.0)
Monocytes Absolute: 0.9 K/uL (ref 0.1–1.0)
Monocytes Relative: 8 %
Neutro Abs: 7.7 K/uL (ref 1.7–7.7)
Neutrophils Relative %: 73 %
Platelets: 301 K/uL (ref 150–400)
RBC: 3.02 MIL/uL — ABNORMAL LOW (ref 4.22–5.81)
RDW: 17 % — ABNORMAL HIGH (ref 11.5–15.5)
Smear Review: NORMAL
WBC: 10.5 K/uL (ref 4.0–10.5)
nRBC: 0 % (ref 0.0–0.2)

## 2023-09-04 LAB — DIRECT ANTIGLOBULIN TEST (NOT AT ARMC)
DAT, IgG: POSITIVE
DAT, complement: POSITIVE

## 2023-09-04 LAB — RETICULOCYTES
Immature Retic Fract: 28.1 % — ABNORMAL HIGH (ref 2.3–15.9)
RBC.: 3.02 MIL/uL — ABNORMAL LOW (ref 4.22–5.81)
Retic Count, Absolute: 351.5 K/uL — ABNORMAL HIGH (ref 19.0–186.0)
Retic Ct Pct: 11.6 % — ABNORMAL HIGH (ref 0.4–3.1)

## 2023-09-04 LAB — LACTATE DEHYDROGENASE: LDH: 316 U/L — ABNORMAL HIGH (ref 98–192)

## 2023-09-11 ENCOUNTER — Inpatient Hospital Stay (HOSPITAL_BASED_OUTPATIENT_CLINIC_OR_DEPARTMENT_OTHER): Admitting: Oncology

## 2023-09-11 DIAGNOSIS — D591 Autoimmune hemolytic anemia, unspecified: Secondary | ICD-10-CM | POA: Diagnosis not present

## 2023-09-11 MED ORDER — SULFAMETHOXAZOLE-TRIMETHOPRIM 800-160 MG PO TABS: 1.0000 | ORAL_TABLET | Freq: Every day | ORAL | 0 refills | Status: DC

## 2023-09-11 MED ORDER — PREDNISONE 20 MG PO TABS: 20.0000 mg | ORAL_TABLET | Freq: Every day | ORAL | 1 refills | Status: DC

## 2023-09-11 MED ORDER — PREDNISONE 20 MG PO TABS: 100.0000 mg | ORAL_TABLET | Freq: Every day | ORAL | 1 refills | Status: DC

## 2023-09-11 NOTE — Progress Notes (Signed)
 [facility] [address] [place] [address]    Clinic Day:  09/11/2023  Referring physician: [name], MD  Patient Care Team: [name], MD as PCP - General (Internal Medicine) Debera Jayson MATSU, MD as PCP - Cardiology (Cardiology) Kristie Lamprey, MD as Consulting Physician (Gastroenterology) Debera Jayson MATSU, MD as Consulting Physician (Cardiology)   ASSESSMENT & PLAN:   Assessment: 1.  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 - Rituximab weekly x4 in June 2015. - He is on prednisone  5 mg daily, most recent taper was in May 2019 from 10 mg daily. -He took 5 mg daily for a long time.  Prednisone  was completely tapered off in November 2021. 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   2.  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  He is on metformin and Actos.    Plan: 1.  Autoimmune hemolytic anemia: - Prednisone  decreased to 10 mg on 01/16/2023 and on 03/21/2023, it was further decreased to 5 mg daily. -Hemolysis labs show positive direct antiglobulin test, elevated LDH 316, hemoglobin 11.0 and reticulocyte count is 11.6% (3%).  -Total bilirubin is elevated at 2.8 (1.4).  -Discussed case with Dr. Davonna and unfortunate looks like patient may have recurrence of hemolytic anemia.  Given his great response with prednisone  in the past, would recommend 1 mg/kg prednisone  with a max dose of 100 mg daily X 2 weeks.  He will then return to clinic for lab work (same as today) with possible decrease by 10 mg every 2 weeks until improvement in his lab work is noted. -We discussed starting calcium and vitamin D as well as prophylactic antibiotics with Bactrim  once per day.  All prescriptions were  sent to the pharmacy and discussed with patient.  He knows to take 100 mg prednisone  each morning with breakfast until he sees us  back in 2 weeks.   2.  Leg swellings: - Swelling has improved to 1+.  Albumin is normal at 3.6.  He has cellulitis on and off.  I have instructed him to wear compression socks.  Orders Placed This Encounter  Procedures   Reticulocytes    Standing Status:   Future    Expected Date:   09/25/2023    Expiration Date:   09/10/2024   CBC with Differential    Standing Status:   Future    Expected Date:   09/25/2023    Expiration Date:   09/10/2024   Lactate dehydrogenase    Standing Status:   Future    Expected Date:   09/25/2023    Expiration Date:   09/10/2024   Hepatic function panel    Standing Status:   Future    Expected Date:   09/25/2023    Expiration Date:   09/10/2024   Direct antiglobulin test    Standing Status:   Future    Expected Date:   09/25/2023    Expiration Date:   09/10/2024    [name] FORBES Hope, NP   7/31/20251:02 PM  CHIEF COMPLAINT:   Diagnosis: autoimmune hemolytic anemia    Cancer Staging  No matching staging information was found for the patient.    Prior Therapy: 1. High dose prednisone  in 09/2012.  2. Rituximab weekly x 4 in 07/2013. 3. Prednisone  taper through 12/2019.  Current Therapy:  prednisone  taper, currently 10 mg, restarted 03/2022   HISTORY OF PRESENT ILLNESS:   Oncology History   No history exists.     INTERVAL HISTORY:   [name] Page is a 78 y.o. male presenting to clinic today for follow up of autoimmune hemolytic anemia. He was last seen by Dr. Rogers in clinic.  Today, he reports doing well.  The swelling in his legs has almost completely gone away.  He still has numbness and tingling in his lower extremities.  He has diarrhea secondary to a colonoscopy that will happen tomorrow.  Appetite is 100% energy levels are about 50%.  PAST MEDICAL HISTORY:   Past Medical History: Past Medical History:  Diagnosis  Date   Coronary atherosclerosis of native coronary artery    Multivessel, LVEF 59%   Essential hypertension    Hemolytic anemia (HCC)    Hyperlipidemia    Pernicious anemia     Surgical History: Past Surgical History:  Procedure Laterality Date   CORONARY ARTERY BYPASS GRAFT  2004   LIMA to LAD, SVG to OM, SVG to PDA    Social History: Social History   Socioeconomic History   Marital status: Widowed    Spouse name: Not on file   Number of children: Not on file   Years of education: Not on file   Highest education level: Not on file  Occupational History   Occupation: Full time  Tobacco Use   Smoking status: Former    Current packs/day: 0.00    Types: Cigarettes    Quit date: 02/11/2002    Years since quitting: 21.5   Smokeless tobacco: Never  Substance and Sexual Activity   Alcohol use: No    Alcohol/week: 0.0 standard drinks of alcohol   Drug use: No   Sexual activity: Not on file  Other Topics Concern   Not on file  Social History Narrative   Not on file   Social Drivers of Health   Financial Resource Strain: Low Risk  (12/20/2019)   Overall Financial Resource Strain (CARDIA)    Difficulty of Paying Living Expenses: Not hard at all  Food Insecurity: No Food Insecurity (12/20/2019)   Hunger Vital Sign    Worried About Running Out of Food in the Last Year: Never true    Ran Out of Food in the Last Year: Never true  Transportation Needs: No Transportation Needs (12/20/2019)   PRAPARE - Administrator, Civil Service (Medical): No    Lack of Transportation (Non-Medical): No  Physical Activity: Inactive (12/20/2019)   Exercise Vital Sign    Days of Exercise per Week: 0 days    Minutes of Exercise per Session: 0 min  Stress: No Stress Concern Present (12/20/2019)   [employer] of Occupational Health - Occupational Stress Questionnaire    Feeling of Stress : Not at all  Social Connections: Moderately Integrated (12/20/2019)   Social Connection and  Isolation Panel    Frequency of Communication with Friends and Family: More than three times a week    Frequency of Social Gatherings with Friends and Family: Once a week    Attends Religious Services: More than 4 times per year    Active Member of [employer] or Organizations: Yes    Attends Banker Meetings: Never    Marital Status: Widowed  Intimate Partner Violence: Not At Risk (12/20/2019)   Humiliation, Afraid, Rape, and Kick  questionnaire    Fear of Current or Ex-Partner: No    Emotionally Abused: No    Physically Abused: No    Sexually Abused: No    Family History: Family History  Problem Relation Age of Onset   Cancer Mother        colon   COPD Father    Ulcerative colitis Father    Heart attack Father     Current Medications:  Current Outpatient Medications:    APPLE CIDER VINEGAR PO, Take 30 mLs by mouth daily., Disp: , Rfl:    aspirin  EC 81 MG tablet, Take 81 mg by mouth daily., Disp: , Rfl:    b complex vitamins tablet, Take 1 tablet by mouth daily., Disp: , Rfl:    calcium carbonate (OS-CAL) 600 MG TABS tablet, Take 600 mg by mouth 2 (two) times daily with a meal. , Disp: , Rfl:    Cranberry 500 MG CAPS, Take 2 capsules by mouth daily. , Disp: , Rfl:    erythromycin ophthalmic ointment, SMARTSIG:In Eye(s), Disp: , Rfl:    FIASP FLEXTOUCH 100 UNIT/ML FlexTouch Pen, Inject 5 Units into the skin 3 (three) times daily before meals., Disp: , Rfl:    folic acid  (FOLVITE ) 1 MG tablet, take 1 tablet by mouth once daily., Disp: 30 tablet, Rfl: 6   furosemide  (LASIX ) 40 MG tablet, Take 40 mg by mouth daily., Disp: , Rfl:    GARLIC OIL PO, Take 1 capsule by mouth daily., Disp: , Rfl:    levothyroxine (SYNTHROID) 75 MCG tablet, Take 75 mcg by mouth daily., Disp: , Rfl:    losartan  (COZAAR ) 50 MG tablet, take 1 tablet (50 milligram total) by mouth daily., Disp: 90 tablet, Rfl: 2   Magnesium 250 MG TABS, Take 1 tablet by mouth daily., Disp: , Rfl:    metFORMIN  (GLUCOPHAGE) 500 MG tablet, , Disp: , Rfl:    metoprolol succinate (TOPROL-XL) 50 MG 24 hr tablet, Take 25 mg by mouth daily., Disp: , Rfl:    Misc Natural Products (PROSTATE CONTROL PO), Take 1 tablet by mouth daily., Disp: , Rfl:    Multiple Vitamins-Minerals (MULTI FOR HIM 50+) TABS, Take 1 tablet by mouth daily. , Disp: , Rfl:    NOVOLOG FLEXPEN 100 UNIT/ML FlexPen, Inject 20 Units into the skin., Disp: , Rfl:    Omega 3-6-9 CAPS, Take 1 capsule by mouth daily. , Disp: , Rfl:    ONETOUCH VERIO test strip, , Disp: , Rfl:    psyllium (METAMUCIL) 58.6 % powder, Take 1 packet by mouth daily. , Disp: , Rfl:    Red Yeast Rice 600 MG TABS, Take 1 tablet by mouth daily. , Disp: , Rfl:    rosuvastatin (CRESTOR) 5 MG tablet, Take 5 mg by mouth once a week., Disp: , Rfl:    Saw Palmetto 160 MG CAPS, Take 1 capsule by mouth daily. , Disp: , Rfl:    sulfamethoxazole -trimethoprim  (BACTRIM  DS) 800-160 MG tablet, Take 1 tablet by mouth daily., Disp: 14 tablet, Rfl: 0   TRESIBA FLEXTOUCH 100 UNIT/ML FlexTouch Pen, Inject 20 Units into the skin at bedtime., Disp: , Rfl:    vitamin B-12 (CYANOCOBALAMIN) 1000 MCG tablet, Take 1,000 mcg by mouth daily., Disp: , Rfl:    predniSONE  (DELTASONE ) 20 MG tablet, Take 5 tablets (100 mg total) by mouth daily with breakfast., Disp: 90 tablet, Rfl: 1   Allergies: No Known Allergies  REVIEW OF SYSTEMS:   Review of Systems  Constitutional:  Positive for fatigue.  Respiratory:  Positive for shortness of breath.   Gastrointestinal:  Positive for diarrhea.  Neurological:  Positive for numbness.     VITALS:   Blood pressure 137/79, pulse 79, temperature 97.9 F (36.6 C), temperature source Oral, resp. rate 18, weight 231 lb (104.8 kg), SpO2 96%.  Wt Readings from Last 3 Encounters:  09/11/23 231 lb (104.8 kg)  08/27/23 238 lb (108 kg)  06/09/23 237 lb 14 oz (107.9 kg)    Body mass index is 30.48 kg/m.  Performance status (ECOG): 0 - Asymptomatic  PHYSICAL  EXAM:   Physical Exam Constitutional:      Appearance: Normal appearance.  Cardiovascular:     Rate and Rhythm: Normal rate and regular rhythm.  Pulmonary:     Effort: Pulmonary effort is normal.     Breath sounds: Normal breath sounds.  Abdominal:     General: Bowel sounds are normal.     Palpations: Abdomen is soft.  Musculoskeletal:        General: No swelling. Normal range of motion.     Right lower leg: Edema present.     Left lower leg: Edema present.  Neurological:     Mental Status: He is alert and oriented to person, place, and time. Mental status is at baseline.     LABS:   CBC     Component Value Date/Time   WBC 10.5 09/04/2023 1024   RBC 3.02 (L) 09/04/2023 1025   RBC 3.02 (L) 09/04/2023 1024   HGB 11.0 (L) 09/04/2023 1024   HCT 34.6 (L) 09/04/2023 1024   PLT 301 09/04/2023 1024   MCV 114.6 (H) 09/04/2023 1024   MCH 36.4 (H) 09/04/2023 1024   MCHC 31.8 09/04/2023 1024   RDW 17.0 (H) 09/04/2023 1024   LYMPHSABS 1.5 09/04/2023 1024   MONOABS 0.9 09/04/2023 1024   EOSABS 0.2 09/04/2023 1024   BASOSABS 0.1 09/04/2023 1024    CMP      Component Value Date/Time   NA 141 06/02/2023 0957   K 4.4 06/02/2023 0957   CL 106 06/02/2023 0957   CO2 29 06/02/2023 0957   GLUCOSE 130 (H) 06/02/2023 0957   BUN 15 06/02/2023 0957   CREATININE 1.02 06/02/2023 0957   CALCIUM 9.0 06/02/2023 0957   PROT 6.8 09/04/2023 1024   ALBUMIN 3.7 09/04/2023 1024   AST 33 09/04/2023 1024   ALT 26 09/04/2023 1024   ALKPHOS 69 09/04/2023 1024   BILITOT 2.8 (H) 09/04/2023 1024   GFRNONAA >60 06/02/2023 0957   GFRAA >60 09/15/2019 1115     No results found for: CEA1, CEA / No results found for: CEA1, CEA No results found for: PSA1 No results found for: CAN199 No results found for: RJW874  Lab Results  Component Value Date   TOTALPROTELP 6.1 09/15/2015   Lab Results  Component Value Date   FERRITIN 138 03/25/2016   Lab Results  Component Value Date    LDH 316 (H) 09/04/2023   LDH 249 (H) 06/02/2023   LDH 254 (H) 03/14/2023     STUDIES:   No results found.

## 2023-09-19 ENCOUNTER — Encounter: Payer: Self-pay | Admitting: Cardiology

## 2023-09-25 ENCOUNTER — Inpatient Hospital Stay: Attending: Oncology

## 2023-09-25 DIAGNOSIS — D591 Autoimmune hemolytic anemia, unspecified: Secondary | ICD-10-CM | POA: Insufficient documentation

## 2023-09-25 LAB — CBC WITH DIFFERENTIAL/PLATELET
Abs Granulocyte: 13.1 K/uL — ABNORMAL HIGH (ref 1.5–6.5)
Abs Immature Granulocytes: 0.09 K/uL — ABNORMAL HIGH (ref 0.00–0.07)
Basophils Absolute: 0 K/uL (ref 0.0–0.1)
Basophils Relative: 0 %
Eosinophils Absolute: 0 K/uL (ref 0.0–0.5)
Eosinophils Relative: 0 %
HCT: 40.1 % (ref 39.0–52.0)
Hemoglobin: 13.2 g/dL (ref 13.0–17.0)
Immature Granulocytes: 1 %
Lymphocytes Relative: 5 %
Lymphs Abs: 0.7 K/uL (ref 0.7–4.0)
MCH: 36.6 pg — ABNORMAL HIGH (ref 26.0–34.0)
MCHC: 32.9 g/dL (ref 30.0–36.0)
MCV: 111.1 fL — ABNORMAL HIGH (ref 80.0–100.0)
Monocytes Absolute: 1.1 K/uL — ABNORMAL HIGH (ref 0.1–1.0)
Monocytes Relative: 8 %
Neutro Abs: 13.1 K/uL — ABNORMAL HIGH (ref 1.7–7.7)
Neutrophils Relative %: 86 %
Platelets: 249 K/uL (ref 150–400)
RBC: 3.61 MIL/uL — ABNORMAL LOW (ref 4.22–5.81)
RDW: 13.9 % (ref 11.5–15.5)
WBC: 15 K/uL — ABNORMAL HIGH (ref 4.0–10.5)
nRBC: 0 % (ref 0.0–0.2)

## 2023-09-25 LAB — RETICULOCYTES
Immature Retic Fract: 18.3 % — ABNORMAL HIGH (ref 2.3–15.9)
RBC.: 3.66 MIL/uL — ABNORMAL LOW (ref 4.22–5.81)
Retic Count, Absolute: 186.3 K/uL — ABNORMAL HIGH (ref 19.0–186.0)
Retic Ct Pct: 5.1 % — ABNORMAL HIGH (ref 0.4–3.1)

## 2023-09-25 LAB — HEPATIC FUNCTION PANEL
ALT: 31 U/L (ref 0–44)
AST: 23 U/L (ref 15–41)
Albumin: 3.3 g/dL — ABNORMAL LOW (ref 3.5–5.0)
Alkaline Phosphatase: 54 U/L (ref 38–126)
Bilirubin, Direct: 0.4 mg/dL — ABNORMAL HIGH (ref 0.0–0.2)
Indirect Bilirubin: 2.1 mg/dL — ABNORMAL HIGH (ref 0.3–0.9)
Total Bilirubin: 2.5 mg/dL — ABNORMAL HIGH (ref 0.0–1.2)
Total Protein: 5.9 g/dL — ABNORMAL LOW (ref 6.5–8.1)

## 2023-09-25 LAB — LACTATE DEHYDROGENASE: LDH: 264 U/L — ABNORMAL HIGH (ref 98–192)

## 2023-09-26 ENCOUNTER — Inpatient Hospital Stay (HOSPITAL_BASED_OUTPATIENT_CLINIC_OR_DEPARTMENT_OTHER): Admitting: Oncology

## 2023-09-26 DIAGNOSIS — D591 Autoimmune hemolytic anemia, unspecified: Secondary | ICD-10-CM | POA: Diagnosis not present

## 2023-09-26 MED ORDER — SULFAMETHOXAZOLE-TRIMETHOPRIM 800-160 MG PO TABS: 1.0000 | ORAL_TABLET | Freq: Every day | ORAL | 0 refills | Status: AC

## 2023-09-26 MED ORDER — PREDNISONE 20 MG PO TABS: 80.0000 mg | ORAL_TABLET | Freq: Every day | ORAL | 0 refills | Status: DC

## 2023-09-26 NOTE — Assessment & Plan Note (Addendum)
-   Hemolysis labs from 2 weeks ago showed recurrence of hemolytic anemia.  He was started on prednisone taper beginning with the 100 mg daily.  He is here for repeat lab draw. -Labs from today show improvement of his hemoglobin along with reticulocyte count and immature reticulocyte fraction.  DAT IgG and complement both positive. -Recommend decreasing prednisone given improvement of his labs from 100 mg daily down to 80 mg/day.  He will remain on this for the next 2 weeks with repeat lab work. -Continue Bactrim for prophylaxis.

## 2023-09-26 NOTE — Progress Notes (Signed)
   [name] OFFICE PROGRESS NOTE  [name] Leta NOVAK, MD  ASSESSMENT & PLAN:  Assessment & Plan Autoimmune hemolytic anemia (HCC) - Hemolysis labs from 2 weeks ago showed recurrence of hemolytic anemia.  He was started on prednisone taper beginning with the 100 mg daily.  He is here for repeat lab draw. -Labs from today show improvement of his hemoglobin along with reticulocyte count and immature reticulocyte fraction.  DAT IgG and complement both positive. -Recommend decreasing prednisone given improvement of his labs from 100 mg daily down to 80 mg/day.  He will remain on this for the next 2 weeks with repeat lab work. -Continue Bactrim for prophylaxis.    No orders of the defined types were placed in this encounter.   INTERVAL HISTORY: Patient returns for follow-up for hemolytic anemia.  Patient was started on 100 mg prednisone daily at his last visit due to hemolytic anemia recurrence.  Patient states he has been doing fair.  He has a hard time sleeping due to high dose of prednisone and has foot and ankle swelling.  He has also been having some shortness of breath.  He has spoken to his PCP and his cardiologist who think it is a combination of the prednisone along with diabetes.  His PCP has recently increased his long-acting insulin dose to help with his rising glucose levels.  Cardiology has some concern about heart failure given his history.  We reviewed his most recent CBC.    SUMMARY OF HEMATOLOGIC HISTORY: - Diagnosed in August 2014, treated with high-dose prednisone. - Rituximab weekly x4 in June 2015. - He is on prednisone 5 mg daily, most recent taper was in May 2019 from 10 mg daily. -He took 5 mg daily for a long time.  Prednisone was completely tapered off in November 2021. - Prednisone 100 mg daily started back on 03/26/2022 for relapsed AIHA, tapered to 60 mg daily on 04/24/2022, tapered to 40 mg daily on 05/08/2022, 20 mg daily on 05/22/2022, alternating 20 mg  and 10 mg on 10/24/2022 with a further decrease to 10 mg daily on 01/16/2023.  No results found for: CBC  There were no vitals filed for this visit.  Review of Systems  Respiratory:  Positive for shortness of breath.   Cardiovascular:  Positive for leg swelling.   Physical Exam Neurological:     Mental Status: He is alert and oriented to person, place, and time.     I spent 20 minutes dedicated to the care of this patient (face-to-face and non-face-to-face) on the date of the encounter to include what is described in the assessment and plan.,  [name] Hope, NP 09/26/2023 1:08 PM

## 2023-09-27 LAB — DIRECT ANTIGLOBULIN TEST (NOT AT ARMC)
DAT, IgG: POSITIVE
DAT, complement: POSITIVE

## 2023-10-06 ENCOUNTER — Other Ambulatory Visit: Payer: Self-pay | Admitting: Oncology

## 2023-10-06 DIAGNOSIS — D591 Autoimmune hemolytic anemia, unspecified: Secondary | ICD-10-CM

## 2023-10-07 ENCOUNTER — Inpatient Hospital Stay

## 2023-10-08 ENCOUNTER — Other Ambulatory Visit: Attending: Oncology

## 2023-10-08 DIAGNOSIS — D591 Autoimmune hemolytic anemia, unspecified: Secondary | ICD-10-CM | POA: Insufficient documentation

## 2023-10-08 LAB — HEPATIC FUNCTION PANEL
ALT: 162 U/L — ABNORMAL HIGH (ref 0–44)
AST: 44 U/L — ABNORMAL HIGH (ref 15–41)
Albumin: 3.6 g/dL (ref 3.5–5.0)
Alkaline Phosphatase: 67 U/L (ref 38–126)
Bilirubin, Direct: 0.3 mg/dL — ABNORMAL HIGH (ref 0.0–0.2)
Indirect Bilirubin: 1.7 mg/dL — ABNORMAL HIGH (ref 0.3–0.9)
Total Bilirubin: 2 mg/dL — ABNORMAL HIGH (ref 0.0–1.2)
Total Protein: 5.8 g/dL — ABNORMAL LOW (ref 6.5–8.1)

## 2023-10-08 LAB — CBC WITH DIFFERENTIAL/PLATELET
Abs Immature Granulocytes: 0.05 K/uL (ref 0.00–0.07)
Basophils Absolute: 0 K/uL (ref 0.0–0.1)
Basophils Relative: 0 %
Eosinophils Absolute: 0 K/uL (ref 0.0–0.5)
Eosinophils Relative: 0 %
HCT: 43.7 % (ref 39.0–52.0)
Hemoglobin: 14.7 g/dL (ref 13.0–17.0)
Immature Granulocytes: 1 %
Lymphocytes Relative: 13 %
Lymphs Abs: 1.3 K/uL (ref 0.7–4.0)
MCH: 35.3 pg — ABNORMAL HIGH (ref 26.0–34.0)
MCHC: 33.6 g/dL (ref 30.0–36.0)
MCV: 104.8 fL — ABNORMAL HIGH (ref 80.0–100.0)
Monocytes Absolute: 0.7 K/uL (ref 0.1–1.0)
Monocytes Relative: 7 %
Neutro Abs: 8 K/uL — ABNORMAL HIGH (ref 1.7–7.7)
Neutrophils Relative %: 79 %
Platelets: 118 K/uL — ABNORMAL LOW (ref 150–400)
RBC: 4.17 MIL/uL — ABNORMAL LOW (ref 4.22–5.81)
RDW: 13.4 % (ref 11.5–15.5)
WBC: 10 K/uL (ref 4.0–10.5)
nRBC: 0 % (ref 0.0–0.2)

## 2023-10-08 LAB — DIRECT ANTIGLOBULIN TEST (NOT AT ARMC)
DAT, IgG: POSITIVE
DAT, complement: POSITIVE

## 2023-10-08 LAB — RETICULOCYTES
Immature Retic Fract: 13.4 % (ref 2.3–15.9)
RBC.: 4.15 MIL/uL — ABNORMAL LOW (ref 4.22–5.81)
Retic Count, Absolute: 112.5 K/uL (ref 19.0–186.0)
Retic Ct Pct: 2.7 % (ref 0.4–3.1)

## 2023-10-08 LAB — LACTATE DEHYDROGENASE: LDH: 299 U/L — ABNORMAL HIGH (ref 98–192)

## 2023-10-14 ENCOUNTER — Other Ambulatory Visit: Payer: Self-pay | Admitting: Oncology

## 2023-10-14 ENCOUNTER — Encounter: Payer: Self-pay | Admitting: Oncology

## 2023-10-14 ENCOUNTER — Inpatient Hospital Stay: Attending: Oncology | Admitting: Oncology

## 2023-10-14 DIAGNOSIS — R748 Abnormal levels of other serum enzymes: Secondary | ICD-10-CM

## 2023-10-14 DIAGNOSIS — R17 Unspecified jaundice: Secondary | ICD-10-CM

## 2023-10-14 DIAGNOSIS — D591 Autoimmune hemolytic anemia, unspecified: Secondary | ICD-10-CM | POA: Diagnosis not present

## 2023-10-14 DIAGNOSIS — Z7952 Long term (current) use of systemic steroids: Secondary | ICD-10-CM | POA: Insufficient documentation

## 2023-10-14 NOTE — Progress Notes (Signed)
 [facility] OFFICE PROGRESS NOTE  I connected with [name] Page on 10/14/23 at 10:30 AM EDT by telephone visit and verified that I am speaking with the correct person using two identifiers.   I discussed the limitations, risks, security and privacy concerns of performing an evaluation and management service by telemedicine and the availability of in-person appointments. I also discussed with the patient that there may be a patient responsible charge related to this service. The patient expressed understanding and agreed to proceed.   Other persons participating in the visit and their role in the encounter: NP, Patient    Patient's location: Home  Provider's location: Clinic    Vyas, Dhruv B, MD  ASSESSMENT & PLAN:   Assessment & Plan Autoimmune hemolytic anemia (HCC) - Hemolysis labs from 41 weeks ago showed recurrence of hemolytic anemia.  He was started on prednisone  taper beginning with the 100 mg daily which was reduced to 80 mg x 2 weeks.  He is here for repeat lab draw.  -Labs from today show improvement of his hemoglobin along with reticulocyte count and immature reticulocyte fraction.  DAT IgG and complement both positive. -Recommend decreasing prednisone  given improvement of his labs from 80 mg/day to 60 mg/day.  He will remain on this for the next 2 weeks with repeat lab work.  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 Improvement noted with total bili at 2.0, indirect 1.7 and direct 0.3. He denies any belly pain. Elevated liver enzymes Off of Losartan  and Bactrim .  Will recheck labs in 2 weeks.  Orders Placed This Encounter  Procedures   Hepatic function panel    Standing Status:   Future    Expected Date:   10/28/2023    Expiration Date:   10/13/2024   Lactate dehydrogenase    Standing Status:   Future    Expected Date:   10/28/2023    Expiration Date:   10/13/2024   CBC with Differential    Standing Status:   Future     Expected Date:   10/28/2023    Expiration Date:   10/13/2024   Reticulocytes    Standing Status:   Future    Expected Date:   10/28/2023    Expiration Date:   10/13/2024   Direct antiglobulin test    Standing Status:   Future    Expected Date:   10/28/2023    Expiration Date:   10/13/2024   INTERVAL HISTORY: Patient returns for follow-up for recurrent hemolytic anemia.  He is currently taking 80 mg prednisone  daily.  Blood sugars have started to come down a little bit and are around 200 in the mornings.  His PCP has adjusted his long-acting insulin which appears to be helping.  He self stopped losartan  because she was not feeling well on it.  He is on Bactrim  for prophylaxis while on prednisone .  He will run out of this prescription on Friday if we do not refill.  He continues to have some shortness of breath and tingling and numbness in his hands and feet.  Denies any pain.  Energy levels are 50% appetite is 100%.  Patient wonders if he would benefit from RSV vaccine.  We reviewed CBC, LDH and CMP.  SUMMARY OF HEMATOLOGIC HISTORY: Oncology History   No history exists.     CBC    Component Value Date/Time   WBC 10.0 10/08/2023 0818   RBC 4.17 (L) 10/08/2023 0818   RBC 4.15 (L) 10/08/2023  0817   HGB 14.7 10/08/2023 0818   HCT 43.7 10/08/2023 0818   PLT 118 (L) 10/08/2023 0818   MCV 104.8 (H) 10/08/2023 0818   MCH 35.3 (H) 10/08/2023 0818   MCHC 33.6 10/08/2023 0818   RDW 13.4 10/08/2023 0818   LYMPHSABS 1.3 10/08/2023 0818   MONOABS 0.7 10/08/2023 0818   EOSABS 0.0 10/08/2023 0818   BASOSABS 0.0 10/08/2023 0818       Latest Ref Rng & Units 10/08/2023    8:18 AM 09/25/2023   10:20 AM 09/04/2023   10:24 AM  CMP  Total Protein 6.5 - 8.1 g/dL 5.8  5.9  6.8   Total Bilirubin 0.0 - 1.2 mg/dL 2.0  2.5  2.8   Alkaline Phos 38 - 126 U/L 67  54  69   AST 15 - 41 U/L 44  23  33   ALT 0 - 44 U/L 162  31  26      Lab Results  Component Value Date   FERRITIN 138 03/25/2016    VITAMINB12 3,185 (H) 11/17/2020    There were no vitals filed for this visit.  Review of System:  Review of Systems  Constitutional:  Positive for malaise/fatigue.  Respiratory:  Positive for shortness of breath.   Neurological:  Positive for dizziness and tingling.    Physical Exam: Physical Exam Neurological:     Mental Status: He is alert and oriented to person, place, and time.      I provided 20 minutes of non face-to-face telephone visit time during this encounter, and > 50% was spent counseling as documented under my assessment & plan.  Delon Hope, NP 10/14/2023 10:57 AM

## 2023-10-14 NOTE — Assessment & Plan Note (Addendum)
-   Hemolysis labs from 41 weeks ago showed recurrence of hemolytic anemia.  He was started on prednisone  taper beginning with the 100 mg daily which was reduced to 80 mg x 2 weeks.  He is here for repeat lab draw.  -Labs from today show improvement of his hemoglobin along with reticulocyte count and immature reticulocyte fraction.  DAT IgG and complement both positive. -Recommend decreasing prednisone  given improvement of his labs from 80 mg/day to 60 mg/day.  He will remain on this for the next 2 weeks with repeat lab work.

## 2023-10-14 NOTE — Assessment & Plan Note (Signed)
 Off of Losartan  and Bactrim .  Will recheck labs in 2 weeks.

## 2023-10-14 NOTE — Assessment & Plan Note (Addendum)
 Likely secondary to recurrent pernicious anemia. Improvement noted with total bili at 2.0, indirect 1.7 and direct 0.3. He denies any belly pain.

## 2023-10-23 ENCOUNTER — Other Ambulatory Visit: Payer: Self-pay | Admitting: Oncology

## 2023-10-27 ENCOUNTER — Other Ambulatory Visit: Payer: Self-pay | Admitting: *Deleted

## 2023-10-27 MED ORDER — PREDNISONE 20 MG PO TABS: 60.0000 mg | ORAL_TABLET | Freq: Every day | ORAL | 1 refills | Status: DC

## 2023-10-30 ENCOUNTER — Inpatient Hospital Stay

## 2023-10-30 DIAGNOSIS — D591 Autoimmune hemolytic anemia, unspecified: Secondary | ICD-10-CM | POA: Diagnosis present

## 2023-10-30 DIAGNOSIS — Z7952 Long term (current) use of systemic steroids: Secondary | ICD-10-CM | POA: Diagnosis not present

## 2023-10-30 LAB — HEPATIC FUNCTION PANEL
ALT: 34 U/L (ref 0–44)
AST: 22 U/L (ref 15–41)
Albumin: 3.1 g/dL — ABNORMAL LOW (ref 3.5–5.0)
Alkaline Phosphatase: 57 U/L (ref 38–126)
Bilirubin, Direct: 0.3 mg/dL — ABNORMAL HIGH (ref 0.0–0.2)
Indirect Bilirubin: 1.2 mg/dL — ABNORMAL HIGH (ref 0.3–0.9)
Total Bilirubin: 1.5 mg/dL — ABNORMAL HIGH (ref 0.0–1.2)
Total Protein: 5.5 g/dL — ABNORMAL LOW (ref 6.5–8.1)

## 2023-10-30 LAB — CBC WITH DIFFERENTIAL/PLATELET
Abs Immature Granulocytes: 0.24 K/uL — ABNORMAL HIGH (ref 0.00–0.07)
Basophils Absolute: 0 K/uL (ref 0.0–0.1)
Basophils Relative: 0 %
Eosinophils Absolute: 0 K/uL (ref 0.0–0.5)
Eosinophils Relative: 0 %
HCT: 42.3 % (ref 39.0–52.0)
Hemoglobin: 14 g/dL (ref 13.0–17.0)
Immature Granulocytes: 2 %
Lymphocytes Relative: 12 %
Lymphs Abs: 1.4 K/uL (ref 0.7–4.0)
MCH: 33.1 pg (ref 26.0–34.0)
MCHC: 33.1 g/dL (ref 30.0–36.0)
MCV: 100 fL (ref 80.0–100.0)
Monocytes Absolute: 0.7 K/uL (ref 0.1–1.0)
Monocytes Relative: 6 %
Neutro Abs: 9.5 K/uL — ABNORMAL HIGH (ref 1.7–7.7)
Neutrophils Relative %: 80 %
Platelets: 249 K/uL (ref 150–400)
RBC: 4.23 MIL/uL (ref 4.22–5.81)
RDW: 13.3 % (ref 11.5–15.5)
WBC: 11.8 K/uL — ABNORMAL HIGH (ref 4.0–10.5)
nRBC: 0.2 % (ref 0.0–0.2)

## 2023-10-30 LAB — DIRECT ANTIGLOBULIN TEST (NOT AT ARMC)
DAT, IgG: POSITIVE
DAT, complement: POSITIVE

## 2023-10-30 LAB — RETICULOCYTES
Immature Retic Fract: 25.9 % — ABNORMAL HIGH (ref 2.3–15.9)
RBC.: 4.19 MIL/uL — ABNORMAL LOW (ref 4.22–5.81)
Retic Count, Absolute: 183.9 K/uL (ref 19.0–186.0)
Retic Ct Pct: 4.4 % — ABNORMAL HIGH (ref 0.4–3.1)

## 2023-10-30 LAB — LACTATE DEHYDROGENASE: LDH: 317 U/L — ABNORMAL HIGH (ref 98–192)

## 2023-11-04 ENCOUNTER — Inpatient Hospital Stay (HOSPITAL_BASED_OUTPATIENT_CLINIC_OR_DEPARTMENT_OTHER): Admitting: Oncology

## 2023-11-04 DIAGNOSIS — K5900 Constipation, unspecified: Secondary | ICD-10-CM | POA: Insufficient documentation

## 2023-11-04 DIAGNOSIS — D591 Autoimmune hemolytic anemia, unspecified: Secondary | ICD-10-CM

## 2023-11-04 DIAGNOSIS — K5909 Other constipation: Secondary | ICD-10-CM | POA: Diagnosis not present

## 2023-11-04 MED ORDER — PREDNISONE 20 MG PO TABS: 40.0000 mg | ORAL_TABLET | Freq: Every day | ORAL | 1 refills | Status: DC

## 2023-11-04 NOTE — Progress Notes (Signed)
 [facility] OFFICE PROGRESS NOTE  [name], MD  I connected with Fairy LELON Herring on 11/04/23 at 11:45 AM EDT by telephone visit and verified that I am speaking with the correct person using two identifiers.   I discussed the limitations, risks, security and privacy concerns of performing an evaluation and management service by telemedicine and the availability of in-person appointments. I also discussed with the patient that there may be a patient responsible charge related to this service. The patient expressed understanding and agreed to proceed.   Other persons participating in the visit and their role in the encounter: NP, Patient    Patient's location: Home  Provider's location: Clinic    ASSESSMENT & PLAN:    Assessment & Plan Autoimmune hemolytic anemia (HCC) - Hemolysis labs from July showed recurrence of hemolytic anemia.  He was started on prednisone  taper beginning with the 100 mg daily which has been decreased every 2 weeks.  His current dose is 60 mg/day. -Lab work from 10/30/2023 showed more or less stable hemolysis labs. -DAT IgG and complement both positive. -Recommend continuing to decrease his prednisone  from 60 mg to 40 mg x 2 weeks.  Lab work in 2 weeks.   Other constipation Had recent colonoscopy which was negative per patient. Reports needing magnesium citrate along with a suppository over the last week to have a bowel movement. Recommend MiraLAX twice daily along with stool softeners as needed. Stay hydrated.  Orders Placed This Encounter  Procedures   Reticulocytes    Standing Status:   Future    Expected Date:   11/18/2023    Expiration Date:   11/03/2024   CBC with Differential    Standing Status:   Future    Expected Date:   11/20/2023    Expiration Date:   11/03/2024   Lactate dehydrogenase    Standing Status:   Future    Expected Date:   11/20/2023    Expiration Date:   11/03/2024   Hepatic function panel    Standing Status:    Future    Expected Date:   11/20/2023    Expiration Date:   11/03/2024   Direct antiglobulin test    Standing Status:   Future    Expected Date:   11/20/2023    Expiration Date:   11/03/2024    INTERVAL HISTORY: Patient returns for follow-up for hemolytic anemia.  He is currently on a slow prednisone  taper for recurrence.  Current prednisone  dose is 60 mg/day.  In the interim, patient fell and suffered a laceration to his scalp requiring sutures.  Reports his legs felt really heavy secondary to cellulitis his PCP feels it is from prednisone  and tripped over a dog bed.  He recently completed Levaquin for cellulitis.  His tetanus was updated.  Imaging was unremarkable.  Reports he recently had a colonoscopy 3 weeks ago which he self-reports as normal.  He has been suffering from constipation and had to give himself a suppository and take magnesium citrate.  Reports he is unsure of why this is happening.  Reports he is drinking plenty of fluids.  We reviewed hemolysis labs.  SUMMARY OF HEMATOLOGIC HISTORY: Oncology History   No history exists.     CBC    Component Value Date/Time   WBC 11.8 (H) 10/30/2023 1048   RBC 4.23 10/30/2023 1048   RBC 4.19 (L) 10/30/2023 1048   HGB 14.0 10/30/2023 1048   HCT 42.3 10/30/2023 1048   PLT  249 10/30/2023 1048   MCV 100.0 10/30/2023 1048   MCH 33.1 10/30/2023 1048   MCHC 33.1 10/30/2023 1048   RDW 13.3 10/30/2023 1048   LYMPHSABS 1.4 10/30/2023 1048   MONOABS 0.7 10/30/2023 1048   EOSABS 0.0 10/30/2023 1048   BASOSABS 0.0 10/30/2023 1048       Latest Ref Rng & Units 10/30/2023   10:48 AM 10/08/2023    8:18 AM 09/25/2023   10:20 AM  CMP  Total Protein 6.5 - 8.1 g/dL 5.5  5.8  5.9   Total Bilirubin 0.0 - 1.2 mg/dL 1.5  2.0  2.5   Alkaline Phos 38 - 126 U/L 57  67  54   AST 15 - 41 U/L 22  44  23   ALT 0 - 44 U/L 34  162  31      Lab Results  Component Value Date   FERRITIN 138 03/25/2016   VITAMINB12 3,185 (H) 11/17/2020    There  were no vitals filed for this visit.  Review of System:  Review of Systems  Cardiovascular:  Positive for leg swelling.  Gastrointestinal:  Positive for constipation.  Musculoskeletal:  Positive for falls.  Skin:        Scalp laceration    Physical Exam: Physical Exam Neurological:     Mental Status: He is alert and oriented to person, place, and time.     I provided 20 minutes of non face-to-face telephone visit time during this encounter, and > 50% was spent counseling as documented under my assessment & plan.  Delon Hope, NP 11/04/2023 12:21 PM

## 2023-11-04 NOTE — Assessment & Plan Note (Addendum)
 Had recent colonoscopy which was negative per patient. Reports needing magnesium citrate along with a suppository over the last week to have a bowel movement. Recommend MiraLAX twice daily along with stool softeners as needed. Stay hydrated.

## 2023-11-04 NOTE — Assessment & Plan Note (Addendum)
-   Hemolysis labs from July showed recurrence of hemolytic anemia.  He was started on prednisone  taper beginning with the 100 mg daily which has been decreased every 2 weeks.  His current dose is 60 mg/day. -Lab work from 10/30/2023 showed more or less stable hemolysis labs. -DAT IgG and complement both positive. -Recommend continuing to decrease his prednisone  from 60 mg to 40 mg x 2 weeks.  Lab work in 2 weeks.

## 2023-11-18 ENCOUNTER — Inpatient Hospital Stay: Attending: Oncology

## 2023-11-18 DIAGNOSIS — K5909 Other constipation: Secondary | ICD-10-CM | POA: Diagnosis not present

## 2023-11-18 DIAGNOSIS — Z7952 Long term (current) use of systemic steroids: Secondary | ICD-10-CM | POA: Insufficient documentation

## 2023-11-18 DIAGNOSIS — D591 Autoimmune hemolytic anemia, unspecified: Secondary | ICD-10-CM | POA: Insufficient documentation

## 2023-11-18 LAB — CBC WITH DIFFERENTIAL/PLATELET
Abs Immature Granulocytes: 0.14 K/uL — ABNORMAL HIGH (ref 0.00–0.07)
Basophils Absolute: 0 K/uL (ref 0.0–0.1)
Basophils Relative: 0 %
Eosinophils Absolute: 0 K/uL (ref 0.0–0.5)
Eosinophils Relative: 0 %
HCT: 43.1 % (ref 39.0–52.0)
Hemoglobin: 14.1 g/dL (ref 13.0–17.0)
Immature Granulocytes: 1 %
Lymphocytes Relative: 9 %
Lymphs Abs: 1.4 K/uL (ref 0.7–4.0)
MCH: 32.3 pg (ref 26.0–34.0)
MCHC: 32.7 g/dL (ref 30.0–36.0)
MCV: 98.9 fL (ref 80.0–100.0)
Monocytes Absolute: 0.6 K/uL (ref 0.1–1.0)
Monocytes Relative: 4 %
Neutro Abs: 12.7 K/uL — ABNORMAL HIGH (ref 1.7–7.7)
Neutrophils Relative %: 86 %
Platelets: 168 K/uL (ref 150–400)
RBC: 4.36 MIL/uL (ref 4.22–5.81)
RDW: 14.4 % (ref 11.5–15.5)
WBC: 14.9 K/uL — ABNORMAL HIGH (ref 4.0–10.5)
nRBC: 0.3 % — ABNORMAL HIGH (ref 0.0–0.2)

## 2023-11-18 LAB — HEPATIC FUNCTION PANEL
ALT: 35 U/L (ref 0–44)
AST: 23 U/L (ref 15–41)
Albumin: 3.5 g/dL (ref 3.5–5.0)
Alkaline Phosphatase: 77 U/L (ref 38–126)
Bilirubin, Direct: 0.5 mg/dL — ABNORMAL HIGH (ref 0.0–0.2)
Indirect Bilirubin: 0.7 mg/dL (ref 0.3–0.9)
Total Bilirubin: 1.3 mg/dL — ABNORMAL HIGH (ref 0.0–1.2)
Total Protein: 5.5 g/dL — ABNORMAL LOW (ref 6.5–8.1)

## 2023-11-18 LAB — DIRECT ANTIGLOBULIN TEST (NOT AT ARMC)
DAT, IgG: POSITIVE
DAT, complement: POSITIVE

## 2023-11-18 LAB — LACTATE DEHYDROGENASE: LDH: 473 U/L — ABNORMAL HIGH (ref 98–192)

## 2023-11-18 LAB — RETICULOCYTES
Immature Retic Fract: 26.8 % — ABNORMAL HIGH (ref 2.3–15.9)
RBC.: 4.38 MIL/uL (ref 4.22–5.81)
Retic Count, Absolute: 167.3 K/uL (ref 19.0–186.0)
Retic Ct Pct: 3.8 % — ABNORMAL HIGH (ref 0.4–3.1)

## 2023-11-18 NOTE — Progress Notes (Signed)
 Blood bank called and reported IGG / DAT positive so blood drawn today has been sent to [facility] for further work up. Messaged DOROTHA Hope NP and reported through secure chat. No new orders received.

## 2023-11-21 ENCOUNTER — Inpatient Hospital Stay (HOSPITAL_BASED_OUTPATIENT_CLINIC_OR_DEPARTMENT_OTHER): Admitting: Oncology

## 2023-11-21 VITALS — BP 138/78 | HR 76 | Temp 98.1°F | Resp 18 | Wt 228.0 lb

## 2023-11-21 DIAGNOSIS — D591 Autoimmune hemolytic anemia, unspecified: Secondary | ICD-10-CM

## 2023-11-21 MED ORDER — PREDNISONE 20 MG PO TABS: 20.0000 mg | ORAL_TABLET | Freq: Every day | ORAL | 0 refills | Status: AC

## 2023-11-21 NOTE — Progress Notes (Signed)
 [name] OFFICE PROGRESS NOTE  [name], MD  ASSESSMENT & PLAN:    Assessment & Plan Autoimmune hemolytic anemia (HCC) - Hemolysis labs from July showed recurrence of hemolytic anemia.  He was started on prednisone  taper beginning with the 100 mg daily which has been decreased every 2 weeks.  His current dose is 40 mg/day. -Lab work from 11/18/23 showed more or less stable hemolysis labs. -DAT IgG and complement both positive. -Recommend continuing to decrease his prednisone  from 40 mg to 20 mg x 2 weeks.  Lab work in 2 weeks.    Orders Placed This Encounter  Procedures   Reticulocytes    Standing Status:   Future    Expected Date:   12/05/2023    Expiration Date:   11/20/2024   CBC with Differential    Standing Status:   Future    Expected Date:   12/05/2023    Expiration Date:   11/20/2024   Lactate dehydrogenase    Standing Status:   Future    Expected Date:   12/05/2023    Expiration Date:   11/20/2024   Hepatic function panel    Standing Status:   Future    Expected Date:   12/05/2023    Expiration Date:   11/20/2024   Direct antiglobulin test    Standing Status:   Future    Expected Date:   12/05/2023    Expiration Date:   11/20/2024    INTERVAL HISTORY: Patient returns for follow-up for hemolytic anemia.  Appetite is 100% energy levels are 25%.  Reports he fell 4 weeks ago after he tripped over a dog fence and hit his face on the corner of a post.  He has a black eye.  Reports he is also developed cellulitis in bilateral lower extremities.  Both legs have been swollen on and off but his left leg is now red and warm to touch.  He was placed on antibiotics.  Reports the same thing happened when he was on steroids last time for recurrence of his hemolytic anemia.  Reports he cannot sleep at night due to urinary frequency.  He has weeping of his left leg as well.  We reviewed CBC, reticulocytes, LDH, hepatic function and direct antiglobulin  test.  SUMMARY OF HEMATOLOGIC HISTORY: Oncology History   No history exists.     CBC    Component Value Date/Time   WBC 14.9 (H) 11/18/2023 1345   RBC 4.38 11/18/2023 1346   RBC 4.36 11/18/2023 1345   HGB 14.1 11/18/2023 1345   HCT 43.1 11/18/2023 1345   PLT 168 11/18/2023 1345   MCV 98.9 11/18/2023 1345   MCH 32.3 11/18/2023 1345   MCHC 32.7 11/18/2023 1345   RDW 14.4 11/18/2023 1345   LYMPHSABS 1.4 11/18/2023 1345   MONOABS 0.6 11/18/2023 1345   EOSABS 0.0 11/18/2023 1345   BASOSABS 0.0 11/18/2023 1345       Latest Ref Rng & Units 11/18/2023    1:45 PM 10/30/2023   10:48 AM 10/08/2023    8:18 AM  CMP  Total Protein 6.5 - 8.1 g/dL 5.5  5.5  5.8   Total Bilirubin 0.0 - 1.2 mg/dL 1.3  1.5  2.0   Alkaline Phos 38 - 126 U/L 77  57  67   AST 15 - 41 U/L 23  22  44   ALT 0 - 44 U/L 35  34  162      Lab Results  Component Value Date   FERRITIN 138 03/25/2016   VITAMINB12 3,185 (H) 11/17/2020    Vitals:   11/21/23 1312  BP: 138/78  Pulse: 76  Resp: 18  Temp: 98.1 F (36.7 C)  SpO2: 100%    Review of System:  Review of Systems  Constitutional:  Positive for malaise/fatigue.  Respiratory:  Positive for shortness of breath.   Cardiovascular:  Positive for leg swelling.  Genitourinary:  Positive for frequency.  Neurological:  Positive for tingling.  Psychiatric/Behavioral:  The patient has insomnia.     Physical Exam: Physical Exam Constitutional:      Appearance: Normal appearance.  HENT:     Head: Normocephalic and atraumatic.  Eyes:     Pupils: Pupils are equal, round, and reactive to light.  Cardiovascular:     Rate and Rhythm: Normal rate and regular rhythm.     Heart sounds: Normal heart sounds. No murmur heard. Pulmonary:     Effort: Pulmonary effort is normal.     Breath sounds: Normal breath sounds. No wheezing.  Abdominal:     General: Bowel sounds are normal. There is no distension.     Palpations: Abdomen is soft.     Tenderness: There  is no abdominal tenderness.  Musculoskeletal:        General: Normal range of motion.     Cervical back: Normal range of motion.     Right lower leg: Pitting Edema present.     Left lower leg: Pitting Edema present.  Skin:    General: Skin is warm and dry.     Findings: Bruising and ecchymosis present. No rash.     Comments: To left eye  Neurological:     Mental Status: He is alert and oriented to person, place, and time.     Gait: Gait is intact.  Psychiatric:        Mood and Affect: Mood and affect normal.        Cognition and Memory: Memory normal.        Judgment: Judgment normal.      I spent 20 minutes dedicated to the care of this patient (face-to-face and non-face-to-face) on the date of the encounter to include what is described in the assessment and plan.,  Delon Hope, NP 11/21/2023 3:05 PM

## 2023-11-21 NOTE — Assessment & Plan Note (Addendum)
-   Hemolysis labs from July showed recurrence of hemolytic anemia.  He was started on prednisone  taper beginning with the 100 mg daily which has been decreased every 2 weeks.  His current dose is 40 mg/day. -Lab work from 11/18/23 showed more or less stable hemolysis labs. -DAT IgG and complement both positive. -Recommend continuing to decrease his prednisone  from 40 mg to 20 mg x 2 weeks.  Lab work in 2 weeks.

## 2023-12-03 ENCOUNTER — Inpatient Hospital Stay (HOSPITAL_BASED_OUTPATIENT_CLINIC_OR_DEPARTMENT_OTHER): Admitting: Oncology

## 2023-12-03 ENCOUNTER — Inpatient Hospital Stay

## 2023-12-03 ENCOUNTER — Other Ambulatory Visit: Payer: Self-pay | Admitting: Oncology

## 2023-12-03 VITALS — BP 132/72 | HR 70 | Temp 97.5°F | Resp 18 | Ht 73.0 in | Wt 217.0 lb

## 2023-12-03 DIAGNOSIS — D591 Autoimmune hemolytic anemia, unspecified: Secondary | ICD-10-CM | POA: Diagnosis not present

## 2023-12-03 DIAGNOSIS — Z7952 Long term (current) use of systemic steroids: Secondary | ICD-10-CM | POA: Diagnosis not present

## 2023-12-03 LAB — CBC WITH DIFFERENTIAL/PLATELET
Abs Immature Granulocytes: 0.18 K/uL — ABNORMAL HIGH (ref 0.00–0.07)
Basophils Absolute: 0 K/uL (ref 0.0–0.1)
Basophils Relative: 0 %
Eosinophils Absolute: 0 K/uL (ref 0.0–0.5)
Eosinophils Relative: 0 %
HCT: 47.5 % (ref 39.0–52.0)
Hemoglobin: 15.5 g/dL (ref 13.0–17.0)
Immature Granulocytes: 1 %
Lymphocytes Relative: 21 %
Lymphs Abs: 2.9 K/uL (ref 0.7–4.0)
MCH: 31.1 pg (ref 26.0–34.0)
MCHC: 32.6 g/dL (ref 30.0–36.0)
MCV: 95.4 fL (ref 80.0–100.0)
Monocytes Absolute: 1.1 K/uL — ABNORMAL HIGH (ref 0.1–1.0)
Monocytes Relative: 8 %
Neutro Abs: 10 K/uL — ABNORMAL HIGH (ref 1.7–7.7)
Neutrophils Relative %: 70 %
Platelets: 286 K/uL (ref 150–400)
RBC: 4.98 MIL/uL (ref 4.22–5.81)
RDW: 14.4 % (ref 11.5–15.5)
WBC: 14.3 K/uL — ABNORMAL HIGH (ref 4.0–10.5)
nRBC: 0.1 % (ref 0.0–0.2)

## 2023-12-03 LAB — BASIC METABOLIC PANEL WITH GFR
Anion gap: 10 (ref 5–15)
BUN: 33 mg/dL — ABNORMAL HIGH (ref 8–23)
CO2: 32 mmol/L (ref 22–32)
Calcium: 9.3 mg/dL (ref 8.9–10.3)
Chloride: 100 mmol/L (ref 98–111)
Creatinine, Ser: 1.15 mg/dL (ref 0.61–1.24)
GFR, Estimated: >60 mL/min (ref >60)
Glucose, Bld: 146 mg/dL — ABNORMAL HIGH (ref 70–99)
Potassium: 4.2 mmol/L (ref 3.5–5.1)
Sodium: 142 mmol/L (ref 135–145)

## 2023-12-03 LAB — DIRECT ANTIGLOBULIN TEST (NOT AT ARMC)
DAT, IgG: POSITIVE
DAT, complement: POSITIVE

## 2023-12-03 LAB — RETICULOCYTES
Immature Retic Fract: 26 % — ABNORMAL HIGH (ref 2.3–15.9)
RBC.: 4.95 MIL/uL (ref 4.22–5.81)
Retic Count, Absolute: 173.7 K/uL (ref 19.0–186.0)
Retic Ct Pct: 3.5 % — ABNORMAL HIGH (ref 0.4–3.1)

## 2023-12-03 LAB — HEPATIC FUNCTION PANEL
ALT: 30 U/L (ref 0–44)
AST: 23 U/L (ref 15–41)
Albumin: 3.9 g/dL (ref 3.5–5.0)
Alkaline Phosphatase: 127 U/L — ABNORMAL HIGH (ref 38–126)
Bilirubin, Direct: 0.5 mg/dL — ABNORMAL HIGH (ref 0.0–0.2)
Indirect Bilirubin: 0.7 mg/dL (ref 0.3–0.9)
Total Bilirubin: 1.2 mg/dL (ref 0.0–1.2)
Total Protein: 6.3 g/dL — ABNORMAL LOW (ref 6.5–8.1)

## 2023-12-03 LAB — LACTATE DEHYDROGENASE: LDH: 419 U/L — ABNORMAL HIGH (ref 98–192)

## 2023-12-03 NOTE — Assessment & Plan Note (Addendum)
-   Hemolysis labs from July showed recurrence of hemolytic anemia.  He was started on prednisone  taper beginning with the 100 mg daily which has been decreased every 2 weeks.  His current dose is 20 mg/day. -Lab work from 12/03/23 showed more or less stable hemolysis labs. -DAT IgG and complement both positive. -Recommend continuing to decrease his prednisone  from 20 mg to 10 mg x 2 weeks.  Lab work in 2 weeks.

## 2023-12-03 NOTE — Progress Notes (Signed)
 [name] OFFICE PROGRESS NOTE  [name] Leta NOVAK, MD  ASSESSMENT & PLAN:    Assessment & Plan Autoimmune hemolytic anemia (HCC) - Hemolysis labs from July showed recurrence of hemolytic anemia.  He was started on prednisone  taper beginning with the 100 mg daily which has been decreased every 2 weeks.  His current dose is 20 mg/day. -Lab work from 12/03/23 showed more or less stable hemolysis labs. -DAT IgG and complement both positive. -Recommend continuing to decrease his prednisone  from 20 mg to 10 mg x 2 weeks.  Lab work in 2 weeks.  Long term systemic steroid user Patient has concerns for osteoporosis due to excessive use of steroids. He has never had a bone  density scan. Will get bone density ordered.  Orders Placed This Encounter  Procedures   DG Bone Density    Standing Status:   Future    Expiration Date:   12/02/2024    Reason for Exam (SYMPTOM  OR DIAGNOSIS REQUIRED):   steroid use    Preferred imaging location?:   [facility]   INTERVAL HISTORY: Patient returns for follow-up.  Patient returns for follow-up for hemolytic anemia.  Appetite is 100% energy levels are 30%.  Reports he fell 6 weeks ago after he tripped over a dog fence and hit his face on the corner of a post.  He has a black eye.  Reports he is also developed cellulitis in bilateral lower extremities.  He was placed on antibiotics with improvement of the redness.  Lasix  was added for weeping and swelling of his legs.  He started on that yesterday.  No additional falls.  Has numbness and tingling in his feet.  Has chronic constipation.   We reviewed CBC, reticulocytes, LDH, hepatic function and direct antiglobulin test.  SUMMARY OF HEMATOLOGIC HISTORY: Oncology History   No history exists.     CBC    Component Value Date/Time   WBC 14.3 (H) 12/03/2023 0942   RBC 4.98 12/03/2023 0942   RBC 4.95 12/03/2023 0942   HGB 15.5 12/03/2023 0942   HCT 47.5 12/03/2023 0942   PLT 286  12/03/2023 0942   MCV 95.4 12/03/2023 0942   MCH 31.1 12/03/2023 0942   MCHC 32.6 12/03/2023 0942   RDW 14.4 12/03/2023 0942   LYMPHSABS 2.9 12/03/2023 0942   MONOABS 1.1 (H) 12/03/2023 0942   EOSABS 0.0 12/03/2023 0942   BASOSABS 0.0 12/03/2023 0942       Latest Ref Rng & Units 12/03/2023   10:09 AM 12/03/2023    9:42 AM 11/18/2023    1:45 PM  CMP  Glucose 70 - 99 mg/dL 853     BUN 8 - 23 mg/dL 33     Creatinine 9.38 - 1.24 mg/dL 8.84     Sodium 864 - 854 mmol/L 142     Potassium 3.5 - 5.1 mmol/L 4.2     Chloride 98 - 111 mmol/L 100     CO2 22 - 32 mmol/L 32     Calcium 8.9 - 10.3 mg/dL 9.3     Total Protein 6.5 - 8.1 g/dL  6.3  5.5   Total Bilirubin 0.0 - 1.2 mg/dL  1.2  1.3   Alkaline Phos 38 - 126 U/L  127  77   AST 15 - 41 U/L  23  23   ALT 0 - 44 U/L  30  35      Lab Results  Component Value Date   FERRITIN  138 03/25/2016   VITAMINB12 3,185 (H) 11/17/2020    Vitals:   12/03/23 1040 12/03/23 1045  BP: (!) 156/77 132/72  Pulse: 70   Resp: 18   Temp: (!) 97.5 F (36.4 C)   SpO2: 98%     Review of System:  Review of Systems  Gastrointestinal:  Positive for constipation.  Neurological:  Positive for sensory change.    Physical Exam: Physical Exam Constitutional:      Appearance: Normal appearance.  HENT:     Head: Normocephalic and atraumatic.  Eyes:     Pupils: Pupils are equal, round, and reactive to light.  Cardiovascular:     Rate and Rhythm: Normal rate and regular rhythm.     Heart sounds: Normal heart sounds. No murmur heard. Pulmonary:     Effort: Pulmonary effort is normal.     Breath sounds: Normal breath sounds. No wheezing.  Abdominal:     General: Bowel sounds are normal. There is no distension.     Palpations: Abdomen is soft.     Tenderness: There is no abdominal tenderness.  Musculoskeletal:        General: Normal range of motion.     Cervical back: Normal range of motion.  Skin:    General: Skin is warm and dry.      Findings: No rash.  Neurological:     Mental Status: He is alert and oriented to person, place, and time.     Gait: Gait is intact.  Psychiatric:        Mood and Affect: Mood and affect normal.        Cognition and Memory: Memory normal.        Judgment: Judgment normal.      I spent 20 minutes dedicated to the care of this patient (face-to-face and non-face-to-face) on the date of the encounter to include what is described in the assessment and plan.,  Delon Hope, NP 12/03/2023 11:16 AM

## 2023-12-10 ENCOUNTER — Ambulatory Visit (HOSPITAL_COMMUNITY)
Admission: RE | Admit: 2023-12-10 | Discharge: 2023-12-10 | Disposition: A | Discharge status: Home / Self Care | Source: Ambulatory Visit | Attending: Oncology | Admitting: Oncology

## 2023-12-10 DIAGNOSIS — M8580 Other specified disorders of bone density and structure, unspecified site: Secondary | ICD-10-CM | POA: Insufficient documentation

## 2023-12-10 DIAGNOSIS — Z7952 Long term (current) use of systemic steroids: Secondary | ICD-10-CM | POA: Insufficient documentation

## 2023-12-16 ENCOUNTER — Other Ambulatory Visit: Payer: Self-pay | Admitting: Oncology

## 2023-12-16 DIAGNOSIS — D591 Autoimmune hemolytic anemia, unspecified: Secondary | ICD-10-CM

## 2023-12-17 ENCOUNTER — Inpatient Hospital Stay: Attending: Oncology | Admitting: Oncology

## 2023-12-17 ENCOUNTER — Inpatient Hospital Stay

## 2023-12-17 VITALS — BP 122/69 | HR 76 | Temp 97.3°F | Resp 18 | Ht 73.0 in | Wt 219.0 lb

## 2023-12-17 DIAGNOSIS — D591 Autoimmune hemolytic anemia, unspecified: Secondary | ICD-10-CM | POA: Insufficient documentation

## 2023-12-17 DIAGNOSIS — M8580 Other specified disorders of bone density and structure, unspecified site: Secondary | ICD-10-CM | POA: Insufficient documentation

## 2023-12-17 DIAGNOSIS — Z7952 Long term (current) use of systemic steroids: Secondary | ICD-10-CM | POA: Insufficient documentation

## 2023-12-17 LAB — HEPATIC FUNCTION PANEL
ALT: 25 U/L (ref 0–44)
AST: 22 U/L (ref 15–41)
Albumin: 3.9 g/dL (ref 3.5–5.0)
Alkaline Phosphatase: 103 U/L (ref 38–126)
Bilirubin, Direct: 0.6 mg/dL — ABNORMAL HIGH (ref 0.0–0.2)
Indirect Bilirubin: 0.8 mg/dL (ref 0.3–0.9)
Total Bilirubin: 1.4 mg/dL — ABNORMAL HIGH (ref 0.0–1.2)
Total Protein: 6.3 g/dL — ABNORMAL LOW (ref 6.5–8.1)

## 2023-12-17 LAB — BASIC METABOLIC PANEL WITH GFR
Anion gap: 9 (ref 5–15)
BUN: 26 mg/dL — ABNORMAL HIGH (ref 8–23)
CO2: 33 mmol/L — ABNORMAL HIGH (ref 22–32)
Calcium: 9.5 mg/dL (ref 8.9–10.3)
Chloride: 104 mmol/L (ref 98–111)
Creatinine, Ser: 1.18 mg/dL (ref 0.61–1.24)
GFR, Estimated: >60 mL/min (ref >60)
Glucose, Bld: 97 mg/dL (ref 70–99)
Potassium: 4.9 mmol/L (ref 3.5–5.1)
Sodium: 145 mmol/L (ref 135–145)

## 2023-12-17 LAB — RETICULOCYTES
Immature Retic Fract: 25.5 % — ABNORMAL HIGH (ref 2.3–15.9)
RBC.: 4.8 MIL/uL (ref 4.22–5.81)
Retic Count, Absolute: 154.1 K/uL (ref 19.0–186.0)
Retic Ct Pct: 3.2 % — ABNORMAL HIGH (ref 0.4–3.1)

## 2023-12-17 LAB — DIRECT ANTIGLOBULIN TEST (NOT AT ARMC)
DAT, IgG: POSITIVE
DAT, complement: POSITIVE

## 2023-12-17 LAB — CBC WITH DIFFERENTIAL/PLATELET
Abs Immature Granulocytes: 0.09 K/uL — ABNORMAL HIGH (ref 0.00–0.07)
Basophils Absolute: 0.1 K/uL (ref 0.0–0.1)
Basophils Relative: 1 %
Eosinophils Absolute: 0.1 K/uL (ref 0.0–0.5)
Eosinophils Relative: 1 %
HCT: 45.4 % (ref 39.0–52.0)
Hemoglobin: 15.1 g/dL (ref 13.0–17.0)
Immature Granulocytes: 1 %
Lymphocytes Relative: 20 %
Lymphs Abs: 2.2 K/uL (ref 0.7–4.0)
MCH: 31.4 pg (ref 26.0–34.0)
MCHC: 33.3 g/dL (ref 30.0–36.0)
MCV: 94.4 fL (ref 80.0–100.0)
Monocytes Absolute: 1 K/uL (ref 0.1–1.0)
Monocytes Relative: 9 %
Neutro Abs: 7.6 K/uL (ref 1.7–7.7)
Neutrophils Relative %: 68 %
Platelets: 258 K/uL (ref 150–400)
RBC: 4.81 MIL/uL (ref 4.22–5.81)
RDW: 14.9 % (ref 11.5–15.5)
WBC: 11.1 K/uL — ABNORMAL HIGH (ref 4.0–10.5)
nRBC: 0 % (ref 0.0–0.2)

## 2023-12-17 LAB — LACTATE DEHYDROGENASE: LDH: 342 U/L — ABNORMAL HIGH (ref 98–192)

## 2023-12-17 MED ORDER — PREDNISONE 5 MG PO TABS: 5.0000 mg | ORAL_TABLET | Freq: Every day | ORAL | 0 refills | Status: AC

## 2023-12-17 NOTE — Progress Notes (Signed)
 [name] OFFICE PROGRESS NOTE  [name] Leta NOVAK, MD  ASSESSMENT & PLAN:    Assessment & Plan Autoimmune hemolytic anemia (HCC) - Hemolysis labs from July showed recurrence of hemolytic anemia.  He was started on prednisone  taper beginning with the 100 mg daily which has been decreased every 2 weeks.  His current dose is 10 mg/day. -Lab work from 12/17/23 showed more or less stable hemolysis labs. -DAT IgG and complement both positive. -Recommend continuing to decrease his prednisone  from 10 mg to 5 mg x 2 weeks.  Lab work in 2 weeks.   Orders Placed This Encounter  Procedures   Basic metabolic panel    Standing Status:   Standing    Number of Occurrences:   4    Expiration Date:   12/16/2024   Reticulocytes    Standing Status:   Standing    Number of Occurrences:   4    Expiration Date:   12/16/2024   CBC with Differential    Standing Status:   Standing    Number of Occurrences:   4    Expiration Date:   12/16/2024   Lactate dehydrogenase    Standing Status:   Standing    Number of Occurrences:   4    Expiration Date:   12/16/2024   Hepatic function panel    Standing Status:   Standing    Number of Occurrences:   4    Expiration Date:   12/16/2024   Direct antiglobulin test    Standing Status:   Standing    Number of Occurrences:   4    Expiration Date:   12/16/2024   INTERVAL HISTORY: Patient returns for follow-up.  Patient returns for follow-up for hemolytic anemia.  Appetite is 100% energy levels are 30%.  Reports he fell about 2 months ago after he tripped over a dog fence and hit his face on the corner of a post.  He has a black eye.  To have leg swelling although this is improving.  No signs of cellulitis at this time.  He continues Lasix  for swelling and weeping.  Has numbness and tingling in his feet.  Has chronic constipation.  Patient had a bone density scan completed which showed osteopenia.  He is currently taking calcium and vitamin D3.   We  reviewed CBC, reticulocytes, LDH, hepatic function and direct antiglobulin test.  SUMMARY OF HEMATOLOGIC HISTORY: Oncology History   No history exists.     CBC    Component Value Date/Time   WBC 11.1 (H) 12/17/2023 0934   RBC 4.81 12/17/2023 0934   RBC 4.80 12/17/2023 0934   HGB 15.1 12/17/2023 0934   HCT 45.4 12/17/2023 0934   PLT 258 12/17/2023 0934   MCV 94.4 12/17/2023 0934   MCH 31.4 12/17/2023 0934   MCHC 33.3 12/17/2023 0934   RDW 14.9 12/17/2023 0934   LYMPHSABS 2.2 12/17/2023 0934   MONOABS 1.0 12/17/2023 0934   EOSABS 0.1 12/17/2023 0934   BASOSABS 0.1 12/17/2023 0934       Latest Ref Rng & Units 12/17/2023    9:34 AM 12/03/2023   10:09 AM 12/03/2023    9:42 AM  CMP  Glucose 70 - 99 mg/dL 97  853    BUN 8 - 23 mg/dL 26  33    Creatinine 9.38 - 1.24 mg/dL 8.81  8.84    Sodium 864 - 145 mmol/L 145  142    Potassium 3.5 - 5.1  mmol/L 4.9  4.2    Chloride 98 - 111 mmol/L 104  100    CO2 22 - 32 mmol/L 33  32    Calcium 8.9 - 10.3 mg/dL 9.5  9.3    Total Protein 6.5 - 8.1 g/dL 6.3   6.3   Total Bilirubin 0.0 - 1.2 mg/dL 1.4   1.2   Alkaline Phos 38 - 126 U/L 103   127   AST 15 - 41 U/L 22   23   ALT 0 - 44 U/L 25   30      Lab Results  Component Value Date   FERRITIN 138 03/25/2016   VITAMINB12 3,185 (H) 11/17/2020    Vitals:   12/17/23 1057  BP: 122/69  Pulse: 76  Resp: 18  Temp: (!) 97.3 F (36.3 C)  SpO2: 100%     Review of System:  Review of Systems  Gastrointestinal:  Positive for constipation.  Neurological:  Positive for sensory change.    Physical Exam: Physical Exam Constitutional:      Appearance: Normal appearance.  HENT:     Head: Normocephalic and atraumatic.  Eyes:     Pupils: Pupils are equal, round, and reactive to light.  Cardiovascular:     Rate and Rhythm: Normal rate and regular rhythm.     Heart sounds: Normal heart sounds. No murmur heard. Pulmonary:     Effort: Pulmonary effort is normal.     Breath sounds:  Normal breath sounds. No wheezing.  Abdominal:     General: Bowel sounds are normal. There is no distension.     Palpations: Abdomen is soft.     Tenderness: There is no abdominal tenderness.  Musculoskeletal:        General: Normal range of motion.     Cervical back: Normal range of motion.  Skin:    General: Skin is warm and dry.     Findings: No rash.  Neurological:     Mental Status: He is alert and oriented to person, place, and time.     Gait: Gait is intact.  Psychiatric:        Mood and Affect: Mood and affect normal.        Cognition and Memory: Memory normal.        Judgment: Judgment normal.      I spent 20 minutes dedicated to the care of this patient (face-to-face and non-face-to-face) on the date of the encounter to include what is described in the assessment and plan.,  Delon Hope, NP 12/17/2023 12:49 PM

## 2023-12-17 NOTE — Assessment & Plan Note (Addendum)
-   Hemolysis labs from July showed recurrence of hemolytic anemia.  He was started on prednisone  taper beginning with the 100 mg daily which has been decreased every 2 weeks.  His current dose is 10 mg/day. -Lab work from 12/17/23 showed more or less stable hemolysis labs. -DAT IgG and complement both positive. -Recommend continuing to decrease his prednisone  from 10 mg to 5 mg x 2 weeks.  Lab work in 2 weeks.

## 2023-12-31 ENCOUNTER — Inpatient Hospital Stay

## 2024-01-01 ENCOUNTER — Inpatient Hospital Stay

## 2024-01-14 ENCOUNTER — Inpatient Hospital Stay

## 2024-01-14 ENCOUNTER — Inpatient Hospital Stay: Attending: Oncology | Admitting: Oncology

## 2024-01-14 VITALS — BP 130/77 | HR 131 | Temp 97.7°F | Resp 19 | Ht 73.0 in | Wt 224.7 lb

## 2024-01-14 DIAGNOSIS — D591 Autoimmune hemolytic anemia, unspecified: Secondary | ICD-10-CM | POA: Insufficient documentation

## 2024-01-14 LAB — CBC WITH DIFFERENTIAL/PLATELET
Abs Immature Granulocytes: 0.08 K/uL — ABNORMAL HIGH (ref 0.00–0.07)
Basophils Absolute: 0.1 K/uL (ref 0.0–0.1)
Basophils Relative: 1 %
Eosinophils Absolute: 0.8 K/uL — ABNORMAL HIGH (ref 0.0–0.5)
Eosinophils Relative: 6 %
HCT: 42.5 % (ref 39.0–52.0)
Hemoglobin: 13.3 g/dL (ref 13.0–17.0)
Immature Granulocytes: 1 %
Lymphocytes Relative: 15 %
Lymphs Abs: 2 K/uL (ref 0.7–4.0)
MCH: 28.5 pg (ref 26.0–34.0)
MCHC: 31.3 g/dL (ref 30.0–36.0)
MCV: 91 fL (ref 80.0–100.0)
Monocytes Absolute: 1.5 K/uL — ABNORMAL HIGH (ref 0.1–1.0)
Monocytes Relative: 11 %
Neutro Abs: 8.7 K/uL — ABNORMAL HIGH (ref 1.7–7.7)
Neutrophils Relative %: 66 %
Platelets: 428 K/uL — ABNORMAL HIGH (ref 150–400)
RBC: 4.67 MIL/uL (ref 4.22–5.81)
RDW: 15.8 % — ABNORMAL HIGH (ref 11.5–15.5)
WBC: 13.2 K/uL — ABNORMAL HIGH (ref 4.0–10.5)
nRBC: 0 % (ref 0.0–0.2)

## 2024-01-14 LAB — BASIC METABOLIC PANEL WITH GFR
Anion gap: 13 (ref 5–15)
BUN: 16 mg/dL (ref 8–23)
CO2: 24 mmol/L (ref 22–32)
Calcium: 9.6 mg/dL (ref 8.9–10.3)
Chloride: 104 mmol/L (ref 98–111)
Creatinine, Ser: 1 mg/dL (ref 0.61–1.24)
GFR, Estimated: >60 mL/min (ref >60)
Glucose, Bld: 141 mg/dL — ABNORMAL HIGH (ref 70–99)
Potassium: 4.3 mmol/L (ref 3.5–5.1)
Sodium: 141 mmol/L (ref 135–145)

## 2024-01-14 LAB — DIRECT ANTIGLOBULIN TEST (NOT AT ARMC)
DAT, IgG: POSITIVE
DAT, complement: POSITIVE

## 2024-01-14 LAB — HEPATIC FUNCTION PANEL
ALT: 22 U/L (ref 0–44)
AST: 26 U/L (ref 15–41)
Albumin: 3.9 g/dL (ref 3.5–5.0)
Alkaline Phosphatase: 129 U/L — ABNORMAL HIGH (ref 38–126)
Bilirubin, Direct: 0.5 mg/dL — ABNORMAL HIGH (ref 0.0–0.2)
Indirect Bilirubin: 0.5 mg/dL (ref 0.3–0.9)
Total Bilirubin: 1 mg/dL (ref 0.0–1.2)
Total Protein: 7.2 g/dL (ref 6.5–8.1)

## 2024-01-14 LAB — RETICULOCYTES
Immature Retic Fract: 27.7 % — ABNORMAL HIGH (ref 2.3–15.9)
RBC.: 4.69 MIL/uL (ref 4.22–5.81)
Retic Count, Absolute: 127.6 K/uL (ref 19.0–186.0)
Retic Ct Pct: 2.7 % (ref 0.4–3.1)

## 2024-01-14 LAB — LACTATE DEHYDROGENASE: LDH: 301 U/L — ABNORMAL HIGH (ref 105–235)

## 2024-01-14 NOTE — Assessment & Plan Note (Addendum)
-   Hemolysis labs from July showed recurrence of hemolytic anemia.  He was started on prednisone  taper beginning with the 100 mg daily which has been decreased every 2 weeks.  His current dose is 5 mg/day which she has been on for 1 month -Lab work from 01/14/2024 show more or less stable hemolysis labs.  We did notice there was a drop in his hemoglobin from 15.1-13.3.  We discussed staying on prednisone  a little longer. -DAT IgG and complement both positive. -Recommend continuing prednisone  5 mg daily x 1 month. -Return to clinic in 1 month with labs and virtual visit.

## 2024-01-14 NOTE — Progress Notes (Signed)
 [name] OFFICE PROGRESS NOTE  [name] Leta NOVAK, MD  ASSESSMENT & PLAN:    Assessment & Plan Autoimmune hemolytic anemia (HCC) - Hemolysis labs from July showed recurrence of hemolytic anemia.  He was started on prednisone  taper beginning with the 100 mg daily which has been decreased every 2 weeks.  His current dose is 5 mg/day which she has been on for 1 month -Lab work from 01/14/2024 show more or less stable hemolysis labs.  We did notice there was a drop in his hemoglobin from 15.1-13.3.  We discussed staying on prednisone  a little longer. -DAT IgG and complement both positive. -Recommend continuing prednisone  5 mg daily x 1 month. -Return to clinic in 1 month with labs and virtual visit.  Orders Placed This Encounter  Procedures   Reticulocytes    Standing Status:   Standing    Number of Occurrences:   4    Next Expected Occurrence:   02/06/2024    Expiration Date:   01/13/2025   Basic metabolic panel    Standing Status:   Standing    Number of Occurrences:   4    Next Expected Occurrence:   02/06/2024    Expiration Date:   01/13/2025   CBC with Differential    Standing Status:   Standing    Number of Occurrences:   4    Next Expected Occurrence:   02/06/2024    Expiration Date:   01/13/2025   Lactate dehydrogenase    Standing Status:   Standing    Number of Occurrences:   4    Next Expected Occurrence:   02/06/2024    Expiration Date:   01/13/2025   Hepatic function panel    Standing Status:   Standing    Number of Occurrences:   4    Next Expected Occurrence:   02/06/2024    Expiration Date:   01/13/2025   Direct antiglobulin test    Standing Status:   Standing    Number of Occurrences:   4    Next Expected Occurrence:   02/06/2024    Expiration Date:   01/13/2025   INTERVAL HISTORY: Patient returns for follow-up.  Patient returns for follow-up for hemolytic anemia.  Appetite is 100% energy levels are 50%.  Reports he fell about 3 months ago after  he tripped over a dog fence and hit his face on the corner of a post.  Reports he had a black eye for nearly 3 months which has slowly improved.  Lower extremity swelling has improved since he has decreased the prednisone .  No concerns for cellulitis at this time.  Continues Lasix  for swelling and weeping.  He has numbness and tingling in his feet.  He has chronic constipation.  Patient had a bone density scan completed which showed osteopenia.  He is currently taking calcium and vitamin D3.   We reviewed CBC, reticulocytes, LDH, hepatic function and direct antiglobulin test.  SUMMARY OF HEMATOLOGIC HISTORY: Oncology History   No history exists.     CBC    Component Value Date/Time   WBC 13.2 (H) 01/14/2024 1103   RBC 4.69 01/14/2024 1104   RBC 4.67 01/14/2024 1103   HGB 13.3 01/14/2024 1103   HCT 42.5 01/14/2024 1103   PLT 428 (H) 01/14/2024 1103   MCV 91.0 01/14/2024 1103   MCH 28.5 01/14/2024 1103   MCHC 31.3 01/14/2024 1103   RDW 15.8 (H) 01/14/2024 1103   LYMPHSABS 2.0 01/14/2024 1103  MONOABS 1.5 (H) 01/14/2024 1103   EOSABS 0.8 (H) 01/14/2024 1103   BASOSABS 0.1 01/14/2024 1103       Latest Ref Rng & Units 01/14/2024   11:03 AM 12/17/2023    9:34 AM 12/03/2023   10:09 AM  CMP  Glucose 70 - 99 mg/dL 858  97  853   BUN 8 - 23 mg/dL 16  26  33   Creatinine 0.61 - 1.24 mg/dL 8.99  8.81  8.84   Sodium 135 - 145 mmol/L 141  145  142   Potassium 3.5 - 5.1 mmol/L 4.3  4.9  4.2   Chloride 98 - 111 mmol/L 104  104  100   CO2 22 - 32 mmol/L 24  33  32   Calcium 8.9 - 10.3 mg/dL 9.6  9.5  9.3   Total Protein 6.5 - 8.1 g/dL 7.2  6.3    Total Bilirubin 0.0 - 1.2 mg/dL 1.0  1.4    Alkaline Phos 38 - 126 U/L 129  103    AST 15 - 41 U/L 26  22    ALT 0 - 44 U/L 22  25       Lab Results  Component Value Date   FERRITIN 138 03/25/2016   VITAMINB12 3,185 (H) 11/17/2020    Vitals:   01/14/24 1143 01/14/24 1145  BP: (!) 144/68 130/77  Pulse: (!) 133 (!) 131  Resp: 19    Temp: 97.7 F (36.5 C)   SpO2: 96%      Review of System:  Review of Systems  Respiratory:  Positive for shortness of breath.   Gastrointestinal:  Positive for constipation.  Neurological:  Positive for tingling and sensory change.    Physical Exam: Physical Exam Constitutional:      Appearance: Normal appearance.  HENT:     Head: Normocephalic and atraumatic.  Eyes:     Pupils: Pupils are equal, round, and reactive to light.  Cardiovascular:     Rate and Rhythm: Normal rate and regular rhythm.     Heart sounds: Normal heart sounds. No murmur heard. Pulmonary:     Effort: Pulmonary effort is normal.     Breath sounds: Normal breath sounds. No wheezing.  Abdominal:     General: Bowel sounds are normal. There is no distension.     Palpations: Abdomen is soft.     Tenderness: There is no abdominal tenderness.  Musculoskeletal:        General: Normal range of motion.     Cervical back: Normal range of motion.  Skin:    General: Skin is warm and dry.     Findings: No rash.  Neurological:     Mental Status: He is alert and oriented to person, place, and time.     Gait: Gait is intact.  Psychiatric:        Mood and Affect: Mood and affect normal.        Cognition and Memory: Memory normal.        Judgment: Judgment normal.      I spent 20 minutes dedicated to the care of this patient (face-to-face and non-face-to-face) on the date of the encounter to include what is described in the assessment and plan.,  Delon Hope, NP 01/14/2024 12:28 PM

## 2024-01-15 NOTE — Addendum Note (Signed)
 Addended by: GEOFM NEST E on: 01/15/2024 08:40 AM   Modules accepted: Orders

## 2024-02-09 ENCOUNTER — Encounter: Payer: Self-pay | Admitting: *Deleted

## 2024-02-13 ENCOUNTER — Inpatient Hospital Stay: Attending: Oncology

## 2024-02-13 DIAGNOSIS — D591 Autoimmune hemolytic anemia, unspecified: Secondary | ICD-10-CM

## 2024-02-13 LAB — CBC WITH DIFFERENTIAL/PLATELET
Abs Immature Granulocytes: 0.08 K/uL — ABNORMAL HIGH (ref 0.00–0.07)
Basophils Absolute: 0.2 K/uL — ABNORMAL HIGH (ref 0.0–0.1)
Basophils Relative: 1 %
Eosinophils Absolute: 0.4 K/uL (ref 0.0–0.5)
Eosinophils Relative: 3 %
HCT: 42.5 % (ref 39.0–52.0)
Hemoglobin: 13.2 g/dL (ref 13.0–17.0)
Immature Granulocytes: 1 %
Lymphocytes Relative: 14 %
Lymphs Abs: 1.9 K/uL (ref 0.7–4.0)
MCH: 28.1 pg (ref 26.0–34.0)
MCHC: 31.1 g/dL (ref 30.0–36.0)
MCV: 90.4 fL (ref 80.0–100.0)
Monocytes Absolute: 1.3 K/uL — ABNORMAL HIGH (ref 0.1–1.0)
Monocytes Relative: 10 %
Neutro Abs: 9.3 K/uL — ABNORMAL HIGH (ref 1.7–7.7)
Neutrophils Relative %: 71 %
Platelets: 279 K/uL (ref 150–400)
RBC: 4.7 MIL/uL (ref 4.22–5.81)
RDW: 16.7 % — ABNORMAL HIGH (ref 11.5–15.5)
WBC: 13.1 K/uL — ABNORMAL HIGH (ref 4.0–10.5)
nRBC: 0 % (ref 0.0–0.2)

## 2024-02-13 LAB — BASIC METABOLIC PANEL WITH GFR
Anion gap: 12 (ref 5–15)
BUN: 23 mg/dL (ref 8–23)
CO2: 25 mmol/L (ref 22–32)
Calcium: 9.1 mg/dL (ref 8.9–10.3)
Chloride: 105 mmol/L (ref 98–111)
Creatinine, Ser: 1.02 mg/dL (ref 0.61–1.24)
GFR, Estimated: >60 mL/min
Glucose, Bld: 180 mg/dL — ABNORMAL HIGH (ref 70–99)
Potassium: 4.6 mmol/L (ref 3.5–5.1)
Sodium: 142 mmol/L (ref 135–145)

## 2024-02-13 LAB — RETICULOCYTES
Immature Retic Fract: 24.7 % — ABNORMAL HIGH (ref 2.3–15.9)
RBC.: 4.67 MIL/uL (ref 4.22–5.81)
Retic Count, Absolute: 128 K/uL (ref 19.0–186.0)
Retic Ct Pct: 2.7 % (ref 0.4–3.1)

## 2024-02-13 LAB — HEPATIC FUNCTION PANEL
ALT: 20 U/L (ref 0–44)
AST: 25 U/L (ref 15–41)
Albumin: 3.7 g/dL (ref 3.5–5.0)
Alkaline Phosphatase: 103 U/L (ref 38–126)
Bilirubin, Direct: 0.6 mg/dL — ABNORMAL HIGH (ref 0.0–0.2)
Indirect Bilirubin: 0.6 mg/dL (ref 0.3–0.9)
Total Bilirubin: 1.2 mg/dL (ref 0.0–1.2)
Total Protein: 6.6 g/dL (ref 6.5–8.1)

## 2024-02-13 LAB — LACTATE DEHYDROGENASE: LDH: 308 U/L — ABNORMAL HIGH (ref 105–235)

## 2024-02-20 ENCOUNTER — Inpatient Hospital Stay: Admitting: Oncology

## 2024-02-20 DIAGNOSIS — D591 Autoimmune hemolytic anemia, unspecified: Secondary | ICD-10-CM

## 2024-02-20 NOTE — Assessment & Plan Note (Addendum)
-   Hemolysis labs from July showed recurrence of hemolytic anemia.  He was started on prednisone  taper beginning with the 100 mg daily which has been decreased every 2 weeks.  His current dose is 5 mg/day which she has been on for 2 months.  -Lab work from 02/13/2024 is more or less stable.  -Recommend reducing prednisone  2.5 mg X 4 weeks and return in 1 month and virtual visit.  -Return to clinic in 1 month with labs and virtual visit.

## 2024-02-20 NOTE — Progress Notes (Signed)
 "  [name] OFFICE PROGRESS NOTE  [name] Leta NOVAK, MD  ASSESSMENT & PLAN:   I connected with [name] Page on 02/20/2024 at  1:15 PM EST by telephone visit and verified that I am speaking with the correct person using two identifiers.   I discussed the limitations, risks, security and privacy concerns of performing an evaluation and management service by telemedicine and the availability of in-person appointments. I also discussed with the patient that there may be a patient responsible charge related to this service. The patient expressed understanding and agreed to proceed.   Other persons participating in the visit and their role in the encounter: NP, Patient    Patients location: Home  Providers location: Clinic  Assessment & Plan Autoimmune hemolytic anemia (HCC) - Hemolysis labs from July showed recurrence of hemolytic anemia.  He was started on prednisone  taper beginning with the 100 mg daily which has been decreased every 2 weeks.  His current dose is 5 mg/day which she has been on for 2 months.  -Lab work from 02/13/2024 is more or less stable.  -Recommend reducing prednisone  2.5 mg X 4 weeks and return in 1 month and virtual visit.  -Return to clinic in 1 month with labs and virtual visit.  No orders of the defined types were placed in this encounter.  INTERVAL HISTORY:  Patient returns for follow-up for hemolytic anemia.  Appetite is 100% energy levels are 50%.   Lower extremity swelling has improved since he has decreased the prednisone .  No concerns for cellulitis at this time.  Continues Lasix  for swelling and weeping.  He has numbness and tingling in his feet.  He has chronic constipation.  Patient had a bone density scan completed which showed osteopenia.  He is currently taking calcium and vitamin D3.  Patient is nervous to come off prednisone  completely given last time he recurred so quickly.  He is doing much better with prednisone  now that the dose is  below 10 mg.   We reviewed CBC, reticulocytes, LDH, hepatic function.   SUMMARY OF HEMATOLOGIC HISTORY: Oncology History   No problem history exists.     CBC    Component Value Date/Time   WBC 13.1 (H) 02/13/2024 1250   RBC 4.70 02/13/2024 1250   RBC 4.67 02/13/2024 1250   HGB 13.2 02/13/2024 1250   HCT 42.5 02/13/2024 1250   PLT 279 02/13/2024 1250   MCV 90.4 02/13/2024 1250   MCH 28.1 02/13/2024 1250   MCHC 31.1 02/13/2024 1250   RDW 16.7 (H) 02/13/2024 1250   LYMPHSABS 1.9 02/13/2024 1250   MONOABS 1.3 (H) 02/13/2024 1250   EOSABS 0.4 02/13/2024 1250   BASOSABS 0.2 (H) 02/13/2024 1250       Latest Ref Rng & Units 02/13/2024   12:50 PM 01/14/2024   11:03 AM 12/17/2023    9:34 AM  CMP  Glucose 70 - 99 mg/dL 819  858  97   BUN 8 - 23 mg/dL 23  16  26    Creatinine 0.61 - 1.24 mg/dL 8.97  8.99  8.81   Sodium 135 - 145 mmol/L 142  141  145   Potassium 3.5 - 5.1 mmol/L 4.6  4.3  4.9   Chloride 98 - 111 mmol/L 105  104  104   CO2 22 - 32 mmol/L 25  24  33   Calcium 8.9 - 10.3 mg/dL 9.1  9.6  9.5   Total Protein 6.5 - 8.1 g/dL 6.6  7.2  6.3   Total Bilirubin 0.0 - 1.2 mg/dL 1.2  1.0  1.4   Alkaline Phos 38 - 126 U/L 103  129  103   AST 15 - 41 U/L 25  26  22    ALT 0 - 44 U/L 20  22  25       Lab Results  Component Value Date   FERRITIN 138 03/25/2016   VITAMINB12 3,185 (H) 11/17/2020    There were no vitals filed for this visit.    Review of System:  Review of Systems  Constitutional:  Positive for malaise/fatigue.  Respiratory:  Positive for shortness of breath.   Cardiovascular:  Positive for leg swelling.  Gastrointestinal:  Positive for constipation.  Neurological:  Positive for tingling and sensory change.    Physical Exam: Physical Exam Neurological:     Mental Status: He is alert and oriented to person, place, and time.      I provided 20 minutes of non face-to-face telephone visit time during this encounter, and > 50% was spent counseling as  documented under my assessment & plan.   Delon Hope, NP 02/20/2024 1:55 PM "

## 2024-03-17 ENCOUNTER — Ambulatory Visit: Admitting: Nurse Practitioner

## 2024-03-17 ENCOUNTER — Ambulatory Visit

## 2024-03-17 ENCOUNTER — Other Ambulatory Visit: Payer: Self-pay | Admitting: Nurse Practitioner

## 2024-03-17 ENCOUNTER — Encounter: Payer: Self-pay | Admitting: Nurse Practitioner

## 2024-03-17 VITALS — BP 120/82 | HR 126 | Ht 73.0 in | Wt 224.2 lb

## 2024-03-17 DIAGNOSIS — R000 Tachycardia, unspecified: Secondary | ICD-10-CM

## 2024-03-17 DIAGNOSIS — E782 Mixed hyperlipidemia: Secondary | ICD-10-CM

## 2024-03-17 DIAGNOSIS — I2510 Atherosclerotic heart disease of native coronary artery without angina pectoris: Secondary | ICD-10-CM

## 2024-03-17 DIAGNOSIS — R0609 Other forms of dyspnea: Secondary | ICD-10-CM

## 2024-03-17 DIAGNOSIS — I25119 Atherosclerotic heart disease of native coronary artery with unspecified angina pectoris: Secondary | ICD-10-CM

## 2024-03-17 MED ORDER — METOPROLOL SUCCINATE ER 50 MG PO TB24: 50.0000 mg | ORAL_TABLET | Freq: Every day | ORAL | 2 refills | Status: AC

## 2024-03-17 NOTE — Patient Instructions (Addendum)
 Medication Instructions:  Your physician has recommended you make the following change in your medication:  Take an additional 25 mg Metoprolol  today Then start taking Metoprolol  50 mg once daily Continue taking all other medications as prescribed   Labwork: None  Testing/Procedures: Your physician has requested that you have an echocardiogram. Echocardiography is a painless test that uses sound waves to create images of your heart. It provides your doctor with information about the size and shape of your heart and how well your hearts chambers and valves are working. This procedure takes approximately one hour. There are no restrictions for this procedure. Please do NOT wear cologne, perfume, aftershave, or lotions (deodorant is allowed). Please arrive 15 minutes prior to your appointment time.  Please note: We ask at that you not bring children with you during ultrasound (echo/ vascular) testing. Due to room size and safety concerns, children are not allowed in the ultrasound rooms during exams. Our front office staff cannot provide observation of children in our lobby area while testing is being conducted. An adult accompanying a patient to their appointment will only be allowed in the ultrasound room at the discretion of the ultrasound technician under special circumstances. We apologize for any inconvenience.  Your physician has recommended that you wear a Zio monitor.   This monitor is a medical device that records the hearts electrical activity. Doctors most often use these monitors to diagnose arrhythmias. Arrhythmias are problems with the speed or rhythm of the heartbeat. The monitor is a small device applied to your chest. You can wear one while you do your normal daily activities. While wearing this monitor if you have any symptoms to push the button and record what you felt. Once you have worn this monitor for the period of time provider prescribed (for 7 days), you will return the  monitor device in the postage paid box. Once it is returned they will download the data collected and provide us  with a report which the provider will then review and we will call you with those results. Important tips:  Avoid showering during the first 24 hours of wearing the monitor. Avoid excessive sweating to help maximize wear time. Do not submerge the device, no hot tubs, and no swimming pools. Keep any lotions or oils away from the patch. After 24 hours you may shower with the patch on. Take brief showers with your back facing the shower head.  Do not remove patch once it has been placed because that will interrupt data and decrease adhesive wear time. Push the button when you have any symptoms and write down what you were feeling. Once you have completed wearing your monitor, remove and place into box which has postage paid and place in your outgoing mailbox.  If for some reason you have misplaced your box then call our office and we can provide another box and/or mail it off for you.   Follow-Up: Your physician recommends that you schedule a follow-up appointment in: 6-8 weeks  Any Other Special Instructions Will Be Listed Below (If Applicable). Please call the office in a week or so to update us  on your heart rates  Thank you for choosing [facility]!     If you need a refill on your cardiac medications before your next appointment, please call your pharmacy.

## 2024-03-17 NOTE — Progress Notes (Unsigned)
" °  Cardiology Office Note   Date:  03/18/2024  ID:  Zayvon, Alicea Oct 12, 1945, MRN [id number] PCP: [name], MD  [facility] Providers Cardiologist:  Jayson Sierras, MD { Click to update primary MD,subspecialty MD or APP then REFRESH:1}    History of Present Illness [name] Page is a 79 y.o. male with a PMH of CAD, s/p CABG in 2004 (LIMA-LAD, SVG-OM, SVG-PDA), HTN, mixed HLD, leg edema, and hx of autoimmune hemolytic anemia (followed by Hematology), who presents today for 6 month follow-up.   Last seen by Dr. Sierras on 08/27/2023. Was doing overall well at the time, notes some stable DOE.   Today he is here for 6 month follow-up appointment. He denies any overt sensation of palpitations but does notice stable DOE since last office visit. Says he has been on prednisone  for many years. Compliant with his medications. Denies any chest pain, palpitations, syncope, presyncope, dizziness, orthopnea, PND, swelling or significant weight changes, acute bleeding, or claudication.  ROS: Negative. See HPI.   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 rate has increased BY  43 BPM Confirmed by Miriam Norris [phone]) on 03/18/2024 2:47:30 PM   Echo 10/2022:  LVEF 55-60%  Grade 1 DD.  Mild aortic valve calcification.   Lexiscan  07/2018:  No diagnostic ST segment changes to indicate ischemia. Occasional PACs and PVCs were noted without sustained arrhythmia. Moderate sized, severe intensity, fixed inferior defect also involving the inferior septal wall at the base most  consistent with infarct scar. There is no active ischemia. This is a low risk study. Nuclear stress EF: 58%.   Physical Exam VS:  BP 120/82 (BP Location: Left Arm)   Pulse (!) 126   Ht 6' 1 (1.854 m)   Wt 224 lb 3.2 oz (101.7 kg)   SpO2 93%   BMI 29.58 kg/m        Wt Readings from Last 3 Encounters:  03/17/24 224 lb 3.2 oz (101.7 kg)  01/14/24 224 lb 11.2 oz (101.9 kg)  12/17/23 219 lb (99.3 kg)    GEN: Well nourished, well developed in no acute distress NECK: No JVD; No carotid bruits CARDIAC: S1/S2, fast rate and regular rhythm, no murmurs, rubs, gallops RESPIRATORY:  Clear to auscultation without rales, wheezing or rhonchi  ABDOMEN: Soft, non-tender, non-distended EXTREMITIES:  No edema; No deformity   ASSESSMENT AND PLAN  Sinus tachycardia CAD, s/p CABG  Mixed HLD DOE    {Are you ordering a CV Procedure (e.g. stress test, cath, DCCV, TEE, etc)?   Press F2        :[id number]}  Dispo: ***  Signed, Norris Miriam, NP   "

## 2024-03-26 ENCOUNTER — Inpatient Hospital Stay: Attending: Oncology

## 2024-04-02 ENCOUNTER — Inpatient Hospital Stay: Admitting: Oncology

## 2024-04-05 ENCOUNTER — Ambulatory Visit

## 2024-06-03 ENCOUNTER — Ambulatory Visit: Admitting: Nurse Practitioner
